# Patient Record
Sex: Female | Born: 1975 | Race: Black or African American | Hispanic: No | Marital: Single | Smoking: Never smoker
Health system: Southern US, Community
[De-identification: ages and names within clinical notes are randomized; demographics above are authoritative.]

## PROBLEM LIST (undated history)

## (undated) DIAGNOSIS — K649 Unspecified hemorrhoids: Secondary | ICD-10-CM

## (undated) DIAGNOSIS — A048 Other specified bacterial intestinal infections: Secondary | ICD-10-CM

## (undated) DIAGNOSIS — K219 Gastro-esophageal reflux disease without esophagitis: Secondary | ICD-10-CM

## (undated) DIAGNOSIS — D649 Anemia, unspecified: Secondary | ICD-10-CM

## (undated) HISTORY — PX: ABDOMINAL HYSTERECTOMY: SHX81

## (undated) HISTORY — DX: Other specified bacterial intestinal infections: A04.8

## (undated) HISTORY — DX: Gastro-esophageal reflux disease without esophagitis: K21.9

## (undated) HISTORY — PX: APPENDECTOMY: SHX54

## (undated) HISTORY — DX: Unspecified hemorrhoids: K64.9

---

## 2003-06-08 ENCOUNTER — Emergency Department (HOSPITAL_COMMUNITY): Admission: EM | Admit: 2003-06-08 | Discharge: 2003-06-08 | Payer: Self-pay | Admitting: Emergency Medicine

## 2005-04-30 ENCOUNTER — Encounter: Admission: RE | Admit: 2005-04-30 | Discharge: 2005-04-30 | Payer: Self-pay | Admitting: Family Medicine

## 2005-10-09 ENCOUNTER — Emergency Department (HOSPITAL_COMMUNITY): Admission: EM | Admit: 2005-10-09 | Discharge: 2005-10-10 | Payer: Self-pay | Admitting: Emergency Medicine

## 2005-11-16 ENCOUNTER — Emergency Department (HOSPITAL_COMMUNITY): Admission: EM | Admit: 2005-11-16 | Discharge: 2005-11-16 | Payer: Self-pay | Admitting: Emergency Medicine

## 2006-03-03 ENCOUNTER — Ambulatory Visit (HOSPITAL_COMMUNITY): Admission: RE | Admit: 2006-03-03 | Discharge: 2006-03-03 | Payer: Self-pay | Admitting: Family Medicine

## 2007-05-18 ENCOUNTER — Ambulatory Visit (HOSPITAL_COMMUNITY): Admission: RE | Admit: 2007-05-18 | Discharge: 2007-05-18 | Payer: Self-pay | Admitting: Family Medicine

## 2007-09-01 ENCOUNTER — Emergency Department (HOSPITAL_COMMUNITY): Admission: EM | Admit: 2007-09-01 | Discharge: 2007-09-01 | Payer: Self-pay | Admitting: Emergency Medicine

## 2007-09-03 ENCOUNTER — Ambulatory Visit: Payer: Self-pay | Admitting: Gastroenterology

## 2007-09-14 ENCOUNTER — Ambulatory Visit: Payer: Self-pay | Admitting: Internal Medicine

## 2007-09-14 ENCOUNTER — Encounter: Payer: Self-pay | Admitting: Internal Medicine

## 2007-09-14 ENCOUNTER — Ambulatory Visit (HOSPITAL_COMMUNITY): Admission: RE | Admit: 2007-09-14 | Discharge: 2007-09-14 | Payer: Self-pay | Admitting: Internal Medicine

## 2007-09-14 HISTORY — PX: ESOPHAGOGASTRODUODENOSCOPY: SHX1529

## 2007-12-31 ENCOUNTER — Ambulatory Visit: Payer: Self-pay | Admitting: Internal Medicine

## 2008-04-17 ENCOUNTER — Ambulatory Visit (HOSPITAL_COMMUNITY): Admission: RE | Admit: 2008-04-17 | Discharge: 2008-04-17 | Payer: Self-pay | Admitting: Pulmonary Disease

## 2008-06-27 ENCOUNTER — Emergency Department (HOSPITAL_COMMUNITY): Admission: EM | Admit: 2008-06-27 | Discharge: 2008-06-27 | Payer: Self-pay | Admitting: Emergency Medicine

## 2008-06-28 ENCOUNTER — Emergency Department (HOSPITAL_COMMUNITY): Admission: EM | Admit: 2008-06-28 | Discharge: 2008-06-28 | Payer: Self-pay | Admitting: Emergency Medicine

## 2008-07-23 ENCOUNTER — Emergency Department (HOSPITAL_COMMUNITY): Admission: EM | Admit: 2008-07-23 | Discharge: 2008-07-23 | Payer: Self-pay | Admitting: Emergency Medicine

## 2008-12-05 ENCOUNTER — Emergency Department (HOSPITAL_COMMUNITY): Admission: EM | Admit: 2008-12-05 | Discharge: 2008-12-05 | Payer: Self-pay | Admitting: Emergency Medicine

## 2010-01-17 ENCOUNTER — Encounter: Payer: Self-pay | Admitting: Gastroenterology

## 2010-01-18 ENCOUNTER — Encounter (INDEPENDENT_AMBULATORY_CARE_PROVIDER_SITE_OTHER): Payer: Self-pay

## 2010-03-07 ENCOUNTER — Ambulatory Visit: Payer: Self-pay | Admitting: Gastroenterology

## 2010-03-07 DIAGNOSIS — R1013 Epigastric pain: Secondary | ICD-10-CM | POA: Insufficient documentation

## 2010-03-07 DIAGNOSIS — K219 Gastro-esophageal reflux disease without esophagitis: Secondary | ICD-10-CM | POA: Insufficient documentation

## 2010-03-07 DIAGNOSIS — Z8711 Personal history of peptic ulcer disease: Secondary | ICD-10-CM

## 2010-03-25 ENCOUNTER — Emergency Department (HOSPITAL_COMMUNITY): Admission: EM | Admit: 2010-03-25 | Discharge: 2010-03-26 | Payer: Self-pay | Admitting: Emergency Medicine

## 2010-03-29 ENCOUNTER — Encounter: Payer: Self-pay | Admitting: Gastroenterology

## 2010-12-10 NOTE — Assessment & Plan Note (Signed)
Summary: ONE YR FU,.GU   Visit Type:  Follow-up Visit Primary Care Provider:  Sudie Bailey  Chief Complaint:  F/U gerd.  History of Present Illness: [name] is here for f/u visit. She has h/o erosive reflux esophagitis and H. Pylori. She was last seen 2/09. She does well for most part if she takes prevacid two times a day. She cut back to once daily for awhile but started having break through symptoms. Recently was treated empirically for H. Pylori based on symptoms. Took amoxicillin and biaxin. She has been treated for a total of three times and really not clear if she ever had documented positive H. Pylori serologies. She has had some discomfort in the epigastrium. BM normal. No melena, brbpr. No dysphagia.  Current Medications (verified): 1)  Lansoprazole 30 Mg Cpdr (Lansoprazole) .... One By Mouth 30 Mins Before Breakfast and 30 Mins Before Evening Meal.  Allergies (verified): 1)  ! Sulfa  Review of Systems      See HPI  Vital Signs:  Patient profile:   35 year old female Height:      61 inches Weight:      129 pounds BMI:     24.46 Temp:     98.1 degrees F oral Pulse rate:   68 / minute BP sitting:   100 / 70  (left arm) Cuff size:   regular  Vitals Entered By: Cloria Spring LPN (March 07, 2010 1:32 PM)  Physical Exam  General:  Well developed, well nourished, no acute distress. Head:  Normocephalic and atraumatic. Eyes:  sclera nonicteric Mouth:  op moist Abdomen:  Bowel sounds normal.  Abdomen is soft, nontender, nondistended.  No rebound or guarding.  No hepatosplenomegaly, masses or hernias.  No abdominal bruits.  Extremities:  No clubbing, cyanosis, edema or deformities noted. Neurologic:  Alert and  oriented x4;  grossly normal neurologically. Skin:  Intact without significant lesions or rashes. Psych:  Alert and cooperative. Normal mood and affect.  Impression & Recommendations:  Problem # 1:  GERD (ICD-530.81)  H/O erosive reflux esophagitis. Typical  heartburn controlled. With recent epigastric discomfort treated for third time for H. Pylori. Need to lay issue to rest. Check H. Pylori stool antigen. Continue Prevacid two times a day for now. At some point, when she is feeling well, she should try to wean down to once daily dosing. New RX given. OV in 2 years or sooner if needed.   Orders: Est. Patient Level II (09811) T-Helicobactor Pylori Antigen Stool (91478) Prescriptions: LANSOPRAZOLE 30 MG CPDR (LANSOPRAZOLE) one by mouth 30 mins before breakfast and 30 mins before evening meal.  #60 x 11   Entered and Authorized by:   Leanna Battles. Dixon Boos   Signed by:   Leanna Battles Dixon Boos on 03/07/2010   Method used:   Electronically to        [facility] (retail)       [address]       [place], [place]  [address]       Ph: [phone] or [phone]       Fax: [phone]   RxID:   [id number]  CC: Dr. John Giovanni  Appended Document: ONE YR FU,.GU reminder in computer  Appended Document: ONE YR FU,.GU Did patient ever do hp stool ag?  Appended Document: ONE YR FU,.GU LMOM to call.  Appended Document: ONE YR FU,.GU Pt said she will try to do this week.

## 2010-12-10 NOTE — Letter (Signed)
Summary: Recall Office Visit  [facility]  [address]   [place], [place] [address]   Phone: [phone]  Fax: [phone]      January 18, 2010   [name] [address] [place], [place]  [address] August 04, 1976   Dear [name],   According to our records, it is time for you to schedule a follow-up office visit with [place]. It will be necessary that you schedule one before getting any additional refills. Please call our office @ [phone].   At your convenience, please call [phone] to schedule an office visit. If you have any questions, concerns, or feel that this letter is in error, we would appreciate your call.   Sincerely,    Cloria Spring LPN  [facility] Ph: [phone]   Fax: [phone]

## 2010-12-10 NOTE — Medication Information (Signed)
Summary: Tax adviser   Imported By: Diana Eves 01/17/2010 14:30:33  _____________________________________________________________________  External Attachment:    Type:   Image     Comment:   External Document  Appended Document: RX Folder I need to see patients chart.  Appended Document: RX Folder On your cart.  Appended Document: RX Folder - lansoprazole SHE NEEDS APPT PRIOR TO FURTHER REFILLS. LAST SEEN 2/09.   Prescriptions: LANSOPRAZOLE 30 MG CPDR (LANSOPRAZOLE) one by mouth 30 mins before breakfast and 30 mins before evening meal.  #60 x 0   Entered and Authorized by:   Leanna Battles. Dixon Boos   Signed by:   Leanna Battles Dixon Boos on 01/18/2010   Method used:   Electronically to        [facility] (retail)       [address]       [place], [place]  [address]       Ph: [phone] or [phone]       Fax: [phone]   RxID:   [id number]     Appended Document: RX Folder LMOM needs to schedule appt for further refills.  Appended Document: RX Folder Letter mailed also to call and schedule appt.

## 2011-01-27 LAB — POCT PREGNANCY, URINE: Preg Test, Ur: NEGATIVE

## 2011-01-27 LAB — URINALYSIS, ROUTINE W REFLEX MICROSCOPIC
Bilirubin Urine: NEGATIVE
Glucose, UA: NEGATIVE mg/dL
Hgb urine dipstick: NEGATIVE
Ketones, ur: NEGATIVE mg/dL
Nitrite: NEGATIVE
Protein, ur: NEGATIVE mg/dL
Specific Gravity, Urine: 1.02 (ref 1.005–1.030)
Urobilinogen, UA: 0.2 mg/dL (ref 0.0–1.0)
pH: 6.5 (ref 5.0–8.0)

## 2011-01-27 LAB — GC/CHLAMYDIA PROBE AMP, GENITAL
Chlamydia, DNA Probe: NEGATIVE
GC Probe Amp, Genital: NEGATIVE

## 2011-01-27 LAB — DIFFERENTIAL
Basophils Absolute: 0 10*3/uL (ref 0.0–0.1)
Basophils Relative: 1 % (ref 0–1)
Eosinophils Absolute: 0.1 10*3/uL (ref 0.0–0.7)
Eosinophils Relative: 2 % (ref 0–5)
Lymphocytes Relative: 24 % (ref 12–46)
Lymphs Abs: 1.8 10*3/uL (ref 0.7–4.0)
Monocytes Absolute: 0.7 10*3/uL (ref 0.1–1.0)
Monocytes Relative: 9 % (ref 3–12)
Neutro Abs: 4.7 10*3/uL (ref 1.7–7.7)
Neutrophils Relative %: 64 % (ref 43–77)

## 2011-01-27 LAB — PREGNANCY, URINE: Preg Test, Ur: NEGATIVE

## 2011-01-27 LAB — CBC
HCT: 35.1 % — ABNORMAL LOW (ref 36.0–46.0)
Hemoglobin: 12.5 g/dL (ref 12.0–15.0)
MCHC: 35.7 g/dL (ref 30.0–36.0)
MCV: 87.9 fL (ref 78.0–100.0)
Platelets: 308 10*3/uL (ref 150–400)
RBC: 4 MIL/uL (ref 3.87–5.11)
RDW: 13.2 % (ref 11.5–15.5)
WBC: 7.4 10*3/uL (ref 4.0–10.5)

## 2011-01-27 LAB — WET PREP, GENITAL
Trich, Wet Prep: NONE SEEN
Yeast Wet Prep HPF POC: NONE SEEN

## 2011-01-27 LAB — RPR: RPR Ser Ql: NONREACTIVE

## 2011-03-25 NOTE — Consult Note (Signed)
NAMESHATERRIA, SAGER            ACCOUNT NO.:  [id number]   MEDICAL RECORD NO.:  [id number]          PATIENT TYPE:  AMB   LOCATION:  DAY                           FACILITY:  [facility]   PHYSICIAN:  Kassie Mends, M.D.      DATE OF BIRTH:  February 02, 1976   DATE OF CONSULTATION:  09/03/2007  DATE OF DISCHARGE:                                 CONSULTATION   REASON FOR CONSULTATION:  Epigastric pain.   PHYSICIAN REQUESTING CONSULTATION:  Hilario Quarry, M.D.   PRIMARY CARE PHYSICIAN:  Mila Homer. Sudie Bailey, M.D.   HISTORY OF PRESENT ILLNESS:  The patient is a 35 year old lady who  presents with intermittent epigastric pain. She states she started  having these symptoms back in June or July of this year. Dr. Sudie Bailey  did some blood work and started her on some Prevpac therapy. It is not  clear whether or not her H. pylori  serologies were positive or not. She  states within a few days, her abdominal pain had resolved. She did very  well for several months but a couple of weeks ago started noticing  recurrent epigastric discomfort. She states the pain is constant but  intermittently is more severe than at other times. She denies any  vomiting but has had nausea. She describes the pain as burning at times.  She states she was Hemoccult negative once this summer. Her bowel  movements are regular. She denies melena or bright red blood per rectum.  Her weight has been stable. She denies any frequent NSAID use. She takes  Aleve a couple of times a month.   CURRENT MEDICATIONS:  She is currently taking her second round of  Prevpac this year. Allegra p.r.n., Aleve p.r.n.   ALLERGIES:  SULFA.   PAST MEDICAL HISTORY:  Negative for chronic illnesses.   PAST SURGICAL HISTORY:  Appendectomy.   FAMILY HISTORY:  Negative for colorectal cancer, chronic GI illnesses,  peptic ulcer disease or liver disease.   SOCIAL HISTORY:  She is single, she is currently in a Manufacturing systems engineer. She  has never been a smoker and no alcohol use.   REVIEW OF SYSTEMS:  See HPI for GI and constitutional. CARDIOPULMONARY:  No chest pain or shortness of breath.   PHYSICAL EXAMINATION:  Weight 132, height 5 foot 1, temperature 97.6,  blood pressure 100/70, pulse 60.  GENERAL:  Pleasant, well-nourished, well-developed female in no acute  distress.  SKIN:  Warm and dry, no jaundice.  HEENT:  Sclera nonicteric. Oropharyngeal mucosa moist and pink. No  lesions, erythema or exudate. No lymphadenopathy or thyromegaly.  CHEST:  Lungs are clear to auscultation.  CARDIAC:  Regular rate and rhythm. Normal S1, S2. No murmurs, rubs or  gallops.  ABDOMEN:  Positive bowel sounds, soft, nondistended. She has mild  epigastric tenderness to deep palpation. No organomegaly or masses, no  rebound tenderness or guarding. No abdominal bruits or hernias.  LOWER EXTREMITIES:  No edema.   She had an abdominal ultrasound done on October 22. She had a partially  contracted gallbladder without stones or wall thickening. Back in July  2008, she had a CT of the abdomen and pelvis which revealed a tiny left  renal cyst and a small 2-cm fibroid.   LABORATORY DATA:  Negative urinalysis, negative hCG urine pregnancy  test, lipase was 29, MET 7 was normal, LFTs were normal. CBC was  unremarkable with a normal white count of 7400, hemoglobin 13.2.   IMPRESSION:  [name] is a 35 year old lady with recurrent epigastric  pain. This occasionally has been occurring for approximately 2 weeks.  Previously responded to Prevpac therapy although I am not aware of  whether or not her H. pylori  serologies were positive in the past.  Given ongoing epigastric discomfort, would recommend upper endoscopy for  further evaluation. Differential includes gastritis, peptic ulcer  disease, GERD, dyspepsia. Low likelihood her symptoms are related to  biliary source such as biliary dyskinesia.   PLAN:  EGD in the near future with Dr.  Cira Servant. She will continue her  Prevpac therapy as before. Further recommendations to follow.      Tana Coast, P.A.      Kassie Mends, M.D.  Electronically Signed    LL/MEDQ  D:  09/03/2007  T:  09/04/2007  Job:  [id number]   cc:   Hilario Quarry, M.D.  Fax: [phone]   Mila Homer. Sudie Bailey, M.D.  Fax: [phone]

## 2011-03-25 NOTE — Op Note (Signed)
[name], [name]            ACCOUNT NO.:  [id number]   MEDICAL RECORD NO.:  [id number]          PATIENT TYPE:  AMB   LOCATION:  DAY                           FACILITY:  [facility]   PHYSICIAN:  R. Roetta Sessions, M.D. DATE OF BIRTH:  03-03-1976   DATE OF PROCEDURE:  09/14/2007  DATE OF DISCHARGE:                               OPERATIVE REPORT   PROCEDURE:  Esophagogastroduodenoscopy with biopsy.   INDICATIONS FOR PROCEDURE:  [name] with intermittent  epigastric pain.  Symptoms started back in June of this year.  She was  treated with the Prevpac for 14 days by Dr. Sudie Bailey previously.  Her  symptoms resolved.  She now has recurrent symptoms is back on Prevpac.  I do not have any documentation of her H. pylori status but presumably  blood work revealed evidence of the infection previously.  She has five  more days Prevpac therapy left on her second around and her symptoms  have currently resolved on this therapy.  EGD is now being done to  further evaluate her recurrent symptoms.  This approach has been  discussed with the patient at length.  Potential risks, benefits and  alternatives have been reviewed, questions answered.  She is agreeable.  Please see documentation in the medical record.   PROCEDURE NOTE:  O2 saturation, blood pressure, pulse, respiration  monitored the entire procedure.   CONSCIOUS SEDATION:  Versed 3 mg IV, Demerol 75 mg IV divided doses.  Cetacaine spray topical pharyngeal anesthesia.   INSTRUMENT:  Pentax video chip system.   FINDINGS:  Examination tubular esophagus revealed four quadrant distal  esophageal erosions extending up into the distal esophagus 4 cm.  The  esophageal mucosa otherwise appeared normal.  Please see photos.  EG  junction easily traversed.  Stomach:  Gastric cavity was empty.  It insufflated well with air.  Thorough examination of gastric mucosa including retroflex view of the  proximal stomach, esophagogastric junction  demonstrated only a couple of  antral erosions.  Pylorus patent, easily traversed.  Examination of the  bulb, second portion revealed no abnormalities.   THERAPEUTIC/DIAGNOSTIC MANEUVERS PERFORMED:  Biopsies of antrum were  taken for histologic study.  The patient tolerated the procedure well,  was reacted endoscopy.   IMPRESSION:  Four quadrant distal esophageal erosion consistent with  moderately severe erosive reflux esophagitis, otherwise normal  esophagus.  Tiny antral erosions otherwise normal stomach, status post  biopsy patent pylorus, normal D1, D2.   I suspect her improvement in symptoms in the second round of anti-H  pylori therapy is related to the b.i.d. PPI component which is more  aggressively addressing her complicated gastroesophageal reflux disease  that we now know is present.   RECOMMENDATIONS:  Complete the Prevpac therapy but then continue taking  Prevacid 30 mg capsules one before breakfast and supper.  Given her  prescription for this.  She is to stay on this medication.  Will plan to  see her back in six weeks, given her literature on antireflux measures,  will follow-up on path.  Further recommendations to follow.      Suszanne Conners  Rourk, M.D.  Electronically Signed     RMR/MEDQ  D:  09/14/2007  T:  09/15/2007  Job:  [id number]   cc:   Mila Homer. Sudie Bailey, M.D.  Fax: [phone]   Hilario Quarry, M.D.  Fax: [phone]

## 2011-03-25 NOTE — Assessment & Plan Note (Signed)
[name], [name]             CHART#:  [id number]   DATE:  12/31/2007                       DOB:  [birth year]   OFFICE FOLLOWUP:  Followup for acid reflux esophagitis.   The patient was last seen in November.  She failed to follow through on  multiple appointments here since that time but overall is doing well.  She took Prevpac for H. pylori.  She had some gastritis/erosions on her  prior EGD and four quadrant distal esophageal erosions.  She has been on  Prevacid 30 mg once daily since that time with excellent control of her  reflux symptoms.  She has occasional post prandial retro-xiphoid pain  with eating things containing cheese.  She has avoided cheese and does  real well and does not really have any problems with other dairy  products.  No dysphagia, no odynophagia.  Her insurance company will  only pay for omeprazole or Nexium.  She has tried Nexium and she says it  is like taking nothing at all.   CURRENT MEDICATIONS:  See updated list.   ALLERGIES:  SULFA.   PHYSICAL EXAMINATION:  GENERAL:  She appears well.  VITAL SIGNS:  Weight 139.5, height 5 feet 1 inch, temp 98, BP 98.64,  pulse 80.  SKIN:  Warm and dry.  ABDOMEN:  Nondistended.  Positive bowel sounds.  Soft.  Entirely  nontender.  Without appreciated mass, organomegaly.   ASSESSMENT:  History of erosive reflux esophagitis doing well on  Prevacid.  She failed to improve with Nexium.   RECOMMENDATIONS:  1. Antireflux measures/diet.  2. Continue Prevacid 30 mg orally daily.  We will try to keep her      supplied with samples as best we can.  We will contact her      insurance company and see if we cannot appeal their stance on      Nexium, which has been tried and does not work.   Unless something comes up, I plan to see this nice lady back in 1 year.       Jonathon Bellows, M.D.  Electronically Signed     RMR/MEDQ  D:  12/31/2007  T:  12/31/2007  Job:  [id number]   cc:   Mila Homer. Sudie Bailey, M.D.

## 2011-03-28 NOTE — Letter (Signed)
January 07, 2008     To Whom It May Concern:   Case No. [id number]  ID#:  [id number]   [name] is a patient of mine for which we are treating for erosive  reflux esophagitis.  She initially presented for treatment back in  October of 2008.  She was having chronic epigastric pain.  She was  treated with a Prevpac for H. pylori.  She underwent EGD by Dr. Jena Gauss on  09/14/2007.  She was found to have four quadrant distal esophageal  erosion consistent with moderately severe erosive reflux esophagitis and  tiny antral erosions.  She had been started on Prevacid 30 mg b.i.d.  Due to the fact that your insurance would not cover this medication for  her, she was then changed to Nexium 40 mg b.i.d.  She states that, it  felt like she was taking nothing at all while on this medication.  She  did give it an adequate trial of at least one month.  We are asking that  you review her case individually as well as her medical records, as we  feel this it is most appropriate that she remain on Prevacid 30 mg  b.i.d. not only for healing purposes but also for symptomatic treatment  of her complicated gastroesophageal reflux disease.     Lorenza Burton, N.P.  Electronically Signed     R. Roetta Sessions, M.D.  Electronically Signed    KJ/MEDQ  D:  01/07/2008  T:  01/08/2008  Job:  [id number]

## 2011-08-13 LAB — URINALYSIS, ROUTINE W REFLEX MICROSCOPIC
Bilirubin Urine: NEGATIVE
Glucose, UA: NEGATIVE
Hgb urine dipstick: NEGATIVE
Ketones, ur: NEGATIVE
Nitrite: NEGATIVE
Protein, ur: NEGATIVE
Specific Gravity, Urine: 1.02
Urobilinogen, UA: 0.2
pH: 8.5 — ABNORMAL HIGH

## 2011-08-20 LAB — COMPREHENSIVE METABOLIC PANEL
ALT: 12
AST: 17
Albumin: 3.9
Alkaline Phosphatase: 93
BUN: 6
CO2: 26
Calcium: 9.7
Chloride: 105
Creatinine, Ser: 0.66
GFR calc Af Amer: >60
GFR calc non Af Amer: >60
Glucose, Bld: 71
Potassium: 3.6
Sodium: 138
Total Bilirubin: 0.5
Total Protein: 7.5

## 2011-08-20 LAB — CBC
HCT: 38.5
Hemoglobin: 13.2
MCHC: 34.2
MCV: 87.9
Platelets: 344
RBC: 4.38
RDW: 13
WBC: 7.4

## 2011-08-20 LAB — DIFFERENTIAL
Basophils Absolute: 0
Basophils Relative: 0
Eosinophils Absolute: 0.1
Eosinophils Relative: 1
Lymphocytes Relative: 15
Lymphs Abs: 1.1
Monocytes Absolute: 0.3
Monocytes Relative: 5
Neutro Abs: 5.8
Neutrophils Relative %: 79 — ABNORMAL HIGH

## 2011-08-20 LAB — LIPASE, BLOOD: Lipase: 29

## 2011-08-20 LAB — URINALYSIS, ROUTINE W REFLEX MICROSCOPIC
Bilirubin Urine: NEGATIVE
Glucose, UA: NEGATIVE
Ketones, ur: NEGATIVE
pH: 7

## 2011-08-20 LAB — PREGNANCY, URINE: Preg Test, Ur: NEGATIVE

## 2012-02-02 ENCOUNTER — Encounter: Payer: Self-pay | Admitting: Internal Medicine

## 2012-04-10 ENCOUNTER — Encounter (HOSPITAL_COMMUNITY): Payer: Self-pay | Admitting: Emergency Medicine

## 2012-04-10 ENCOUNTER — Emergency Department (HOSPITAL_COMMUNITY): Payer: BC Managed Care – PPO

## 2012-04-10 ENCOUNTER — Emergency Department (HOSPITAL_COMMUNITY)
Admission: EM | Admit: 2012-04-10 | Discharge: 2012-04-10 | Disposition: A | Discharge status: Home / Self Care | Payer: BC Managed Care – PPO | Attending: Emergency Medicine | Admitting: Emergency Medicine

## 2012-04-10 DIAGNOSIS — R51 Headache: Secondary | ICD-10-CM | POA: Insufficient documentation

## 2012-04-10 DIAGNOSIS — S0990XA Unspecified injury of head, initial encounter: Secondary | ICD-10-CM

## 2012-04-10 DIAGNOSIS — W108XXA Fall (on) (from) other stairs and steps, initial encounter: Secondary | ICD-10-CM | POA: Insufficient documentation

## 2012-04-10 DIAGNOSIS — S060XAA Concussion with loss of consciousness status unknown, initial encounter: Secondary | ICD-10-CM | POA: Insufficient documentation

## 2012-04-10 DIAGNOSIS — S060X9A Concussion with loss of consciousness of unspecified duration, initial encounter: Secondary | ICD-10-CM

## 2012-04-10 MED ORDER — NAPROXEN 500 MG PO TABS: 500.0000 mg | ORAL_TABLET | Freq: Two times a day (BID) | ORAL | Status: DC

## 2012-04-10 MED ORDER — OXYCODONE-ACETAMINOPHEN 5-325 MG PO TABS
2.0000 | ORAL_TABLET | Freq: Once | ORAL | Status: DC
  Filled 2012-04-10: qty 2

## 2012-04-10 MED ORDER — IBUPROFEN 800 MG PO TABS
800.0000 mg | ORAL_TABLET | Freq: Once | ORAL | Status: AC
  Administered 2012-04-10: 800 mg via ORAL
  Filled 2012-04-10: qty 1

## 2012-04-10 NOTE — ED Notes (Signed)
Patient informed of plan of care at this time. Informed motrin will be ordered if CT is normal. Patient agreeable with plan.

## 2012-04-10 NOTE — Discharge Instructions (Signed)
Head Injury, Adult  You have had a head injury that does not appear serious at this time. A concussion is a state of changed mental ability, usually from a blow to the head. You should take clear liquids for the rest of the day and then resume your regular diet. You should not take sedatives or alcoholic beverages for as long as directed by your caregiver after discharge. After injuries such as yours, most problems occur within the first 24 hours.  SYMPTOMS  These minor symptoms may be experienced after discharge:  · Memory difficulties.  · Dizziness.  · Headaches.  · Double vision.  · Hearing difficulties.  · Depression.  · Tiredness.  · Weakness.  · Difficulty with concentration.  If you experience any of these problems, you should not be alarmed. A concussion requires a few days for recovery. Many patients with head injuries frequently experience such symptoms. Usually, these problems disappear without medical care. If symptoms last for more than one day, notify your caregiver. See your caregiver sooner if symptoms are becoming worse rather than better.  HOME CARE INSTRUCTIONS   · During the next 24 hours you must stay with someone who can watch you for the warning signs listed below.  Although it is unlikely that serious side effects will occur, you should be aware of signs and symptoms which may necessitate your return to this location. Side effects may occur up to 7 - 10 days following the injury. It is important for you to carefully monitor your condition and contact your caregiver or seek immediate medical attention if there is a change in your condition.  SEEK IMMEDIATE MEDICAL CARE IF:   · There is confusion or drowsiness.  · You can not awaken the injured person.  · There is nausea (feeling sick to your stomach) or continued, forceful vomiting.  · You notice dizziness or unsteadiness which is getting worse, or inability to walk.  · You have convulsions or unconsciousness.  · You experience severe,  persistent headaches not relieved by over-the-counter or prescription medicines for pain. (Do not take aspirin as this impairs clotting abilities). Take other pain medications only as directed.  · You can not use arms or legs normally.  · There is clear or bloody discharge from the nose or ears.  MAKE SURE YOU:   · Understand these instructions.  · Will watch your condition.  · Will get help right away if you are not doing well or get worse.  Document Released: 10/27/2005 Document Revised: 10/16/2011 Document Reviewed: 09/14/2009  [employer]® Patient Information ©2012 [employer].

## 2012-04-10 NOTE — ED Provider Notes (Signed)
History  This chart was scribed for Dayton Bailiff, MD by Stevphen Meuse. This patient was seen in room APA09/APA09 and the patient's care was started at 12:38PM.  CSN: [id number]  Arrival date & time 04/10/12  1212   First MD Initiated Contact with Patient 04/10/12 1236      Chief Complaint  Patient presents with  . Fall  . Head Injury    (Consider location/radiation/quality/duration/timing/severity/associated sxs/prior treatment) Patient is a 36 y.o. female presenting with fall and head injury. The history is provided by the patient. No language interpreter was used.  Fall The accident occurred 1 to 2 hours ago. There was no blood loss. The point of impact was the head. The pain is present in the head. She was ambulatory at the scene. There was no entrapment after the fall. Associated symptoms include visual change. Pertinent negatives include no fever, no abdominal pain, no nausea, no vomiting and no loss of consciousness. She has tried nothing for the symptoms.  Head Injury  The incident occurred 1 to 2 hours ago. She came to the ER via walk-in. The injury mechanism was a fall. There was no loss of consciousness. There was no blood loss. Pertinent negatives include no vomiting. She has tried nothing for the symptoms.  [name] is a 36 y.o. female who presents to the Emergency Department complaining of 2 hours of sudden onset, gradually worsening head pain. Pt states that she fell down 4 steps and struck the back of her head. She denies any LOC and states that she saw zig zag lines out of her right eye. Pt states that she feels like she has internal bleeding. Pt denies taking any medication to relieve her pain because she wanted to wait until she got checked out by a doctor first. Pt also denies any modifying factors. Pt denies fever, sore throat, eye pain, SOB, chest pain, dysuria, arthralgia, HA, adenopathy and confusion as associated symptoms. Pt does not have a h/o chronic medical  conditions. Pt denies a h/o smoking and alcohol use.   History reviewed. No pertinent past medical history.  History reviewed. No pertinent past surgical history.  History reviewed. No pertinent family history.  History  Substance Use Topics  . Smoking status: Not on file  . Smokeless tobacco: Not on file  . Alcohol Use: No    OB History    Grav Para Term Preterm Abortions TAB SAB Ect Mult Living                  Review of Systems  Constitutional: Negative for fever, chills, appetite change and fatigue.  HENT: Negative for sore throat, rhinorrhea and neck pain.   Eyes: Positive for visual disturbance.  Gastrointestinal: Negative for nausea, vomiting and abdominal pain.  Genitourinary: Negative for dysuria, urgency, flank pain and difficulty urinating.  Musculoskeletal: Negative for back pain.  Skin: Negative for rash and wound.  Neurological: Negative for loss of consciousness and syncope.  Hematological: Does not bruise/bleed easily.  Psychiatric/Behavioral: Negative for confusion.  All other systems reviewed and are negative.    Allergies  Sulfonamide derivatives  Home Medications   Current Outpatient Rx  Name Route Sig Dispense Refill  . ALBUTEROL SULFATE HFA 108 (90 BASE) MCG/ACT IN AERS Inhalation Inhale 2 puffs into the lungs every 6 (six) hours as needed. For shortness of breath    . LORATADINE 10 MG PO TABS Oral Take 10 mg by mouth daily as needed. For allergies    . NAPROXEN  500 MG PO TABS Oral Take 1 tablet (500 mg total) by mouth 2 (two) times daily. 30 tablet 0    Triage Vitals: BP 122/73  Pulse 87  Temp(Src) 98.6 F (37 C) (Oral)  Resp 18  Ht 5\' 1"  (1.549 m)  Wt 135 lb (61.236 kg)  BMI 25.51 kg/m2  SpO2 100%  LMP 03/09/2012  Physical Exam  Nursing note and vitals reviewed. Constitutional: She is oriented to person, place, and time. She appears well-developed and well-nourished.  HENT:  Head: Normocephalic and atraumatic.  Mouth/Throat:  Oropharynx is clear and moist.  Eyes: Conjunctivae and EOM are normal. Pupils are equal, round, and reactive to light.  Neck: Normal range of motion. Neck supple.  Cardiovascular: Normal rate, regular rhythm, normal heart sounds and intact distal pulses.  Exam reveals no gallop and no friction rub.   No murmur heard. Pulmonary/Chest: Effort normal and breath sounds normal.  Abdominal: Soft. Bowel sounds are normal. There is no tenderness. There is no rebound and no guarding.  Musculoskeletal: Normal range of motion. She exhibits no edema and no tenderness.  Neurological: She is alert and oriented to person, place, and time. She has normal strength. No cranial nerve deficit or sensory deficit.  Skin: Skin is warm and dry.  Psychiatric: She has a normal mood and affect. Her behavior is normal. Judgment and thought content normal.    ED Course  Procedures (including critical care time)  DIAGNOSTIC STUDIES: Oxygen Saturation is 100% on room air, normal by my interpretation.    COORDINATION OF CARE:  12:44PM Discussed administering medication for pain and ordering CT scan of her head with pt and pt agreed.  Labs Reviewed - No data to display Ct Head Wo Contrast  04/10/2012  *RADIOLOGY REPORT*  Clinical Data: 36 year old female status post fall with back of head injury.  CT HEAD WITHOUT CONTRAST  Technique:  Contiguous axial images were obtained from the base of the skull through the vertex without contrast.  Comparison: 04/17/2008.  Findings: Visualized paranasal sinuses and mastoids are clear. Visualized orbit soft tissues are within normal limits.  No scalp hematoma identified. No acute osseous abnormality identified.  Cerebral volume is within normal limits for age.  No midline shift, ventriculomegaly, mass effect, evidence of mass lesion, intracranial hemorrhage or evidence of cortically based acute infarction.  Gray-white matter differentiation is within normal limits throughout the brain.   No suspicious intracranial vascular hyperdensity.  IMPRESSION: Stable and normal noncontrast CT appearance of the brain.  No acute traumatic injury identified.  Original Report Authenticated By: Ulla Potash III, M.D.     1. Closed head injury   2. Concussion       MDM  Close injury was minor concussion. There is no evidence of intracranial hemorrhage. I explained to the patient that she did not require a CAT scan of the head due to her lack of loss of consciousness, lack of neurologic symptoms based on the [place] head CT rules. However the patient requested a CAT scan of her head. This was negative. She'll be discharged home      I personally performed the services described in this documentation, which was scribed in my presence. The recorded information has been reviewed and considered.    Dayton Bailiff, MD 04/10/12 1401

## 2012-04-10 NOTE — ED Notes (Signed)
Pt c/o fall earlier today. Pt states she fell down 4 step and struck the back of head. Pt denies any loc and states the sight of her rt eye is off ( she sees a zigzag line).

## 2012-04-10 NOTE — ED Notes (Signed)
Patient refusing percocet at this time. States "I think that is too strong right now" Requesting motrin. Dr Brooke Dare aware and will order motrin if CT is normal.

## 2012-07-14 ENCOUNTER — Emergency Department (HOSPITAL_COMMUNITY)
Admission: EM | Admit: 2012-07-14 | Discharge: 2012-07-15 | Disposition: A | Discharge status: Home / Self Care | Payer: BC Managed Care – PPO | Attending: Emergency Medicine | Admitting: Emergency Medicine

## 2012-07-14 ENCOUNTER — Emergency Department (HOSPITAL_COMMUNITY): Payer: BC Managed Care – PPO

## 2012-07-14 ENCOUNTER — Encounter (HOSPITAL_COMMUNITY): Payer: Self-pay | Admitting: Emergency Medicine

## 2012-07-14 DIAGNOSIS — R05 Cough: Secondary | ICD-10-CM | POA: Insufficient documentation

## 2012-07-14 DIAGNOSIS — B9789 Other viral agents as the cause of diseases classified elsewhere: Secondary | ICD-10-CM | POA: Insufficient documentation

## 2012-07-14 DIAGNOSIS — Z882 Allergy status to sulfonamides status: Secondary | ICD-10-CM | POA: Insufficient documentation

## 2012-07-14 DIAGNOSIS — B349 Viral infection, unspecified: Secondary | ICD-10-CM

## 2012-07-14 DIAGNOSIS — R059 Cough, unspecified: Secondary | ICD-10-CM | POA: Insufficient documentation

## 2012-07-14 DIAGNOSIS — J309 Allergic rhinitis, unspecified: Secondary | ICD-10-CM | POA: Insufficient documentation

## 2012-07-14 MED ORDER — GUAIFENESIN ER 600 MG PO TB12: 600.0000 mg | ORAL_TABLET | Freq: Two times a day (BID) | ORAL | Status: DC

## 2012-07-14 MED ORDER — BENZONATATE 100 MG PO CAPS: 200.0000 mg | ORAL_CAPSULE | Freq: Two times a day (BID) | ORAL | Status: AC | PRN

## 2012-07-14 MED ORDER — SODIUM CHLORIDE 0.65 % NA SOLN: 1.0000 | NASAL | Status: DC | PRN

## 2012-07-14 MED ORDER — FEXOFENADINE HCL 180 MG PO TABS: 180.0000 mg | ORAL_TABLET | Freq: Every day | ORAL | Status: DC

## 2012-07-14 MED ORDER — OXYCODONE-ACETAMINOPHEN 5-325 MG PO TABS: 1.0000 | ORAL_TABLET | Freq: Four times a day (QID) | ORAL | Status: AC | PRN

## 2012-07-14 NOTE — ED Notes (Signed)
Patient c/o nasal congestion and cough; also c/o RUQ pain.

## 2012-07-14 NOTE — ED Notes (Signed)
Patient transported to X-ray 

## 2012-07-14 NOTE — ED Provider Notes (Signed)
History    This chart was scribed for Tobin Chad, MD, MD by Smitty Pluck. The patient was seen in room APA11 and the patient's care was started at 11:06PM.   CSN: [id number]  Arrival date & time 07/14/12  2141   First MD Initiated Contact with Patient 07/14/12 1306      Chief Complaint  Patient presents with  . Nasal Congestion  . Cough    (Consider location/radiation/quality/duration/timing/severity/associated sxs/prior treatment) Patient is a 36 y.o. female presenting with cough. The history is provided by the patient. No language interpreter was used.  Cough Associated symptoms include rhinorrhea and sore throat.   [name] is a 36 y.o. female who presents to the Emergency Department complaining of constant, moderate nasal congestion, non productive cough, headache and sharp chest pain onset 2 days ago. She reports having sore throat 1 day ago but symptoms have been improved today. Pt reports that she thinks she has a fever. Denies taking medication PTA.   PCP is Dr. Sudie Bailey   History reviewed. No pertinent past medical history.  History reviewed. No pertinent past surgical history.  No family history on file.  History  Substance Use Topics  . Smoking status: Never Smoker   . Smokeless tobacco: Not on file  . Alcohol Use: No    OB History    Grav Para Term Preterm Abortions TAB SAB Ect Mult Living                  Review of Systems  Constitutional: Positive for fever.  HENT: Positive for congestion, sore throat and rhinorrhea.   Respiratory: Positive for cough.   Gastrointestinal: Negative for nausea, vomiting, abdominal pain and diarrhea.  Skin: Negative for rash.  All other systems reviewed and are negative.    Allergies  Sulfonamide derivatives  Home Medications   Current Outpatient Rx  Name Route Sig Dispense Refill  . ALBUTEROL SULFATE HFA 108 (90 BASE) MCG/ACT IN AERS Inhalation Inhale 2 puffs into the lungs every 6 (six) hours as  needed. For shortness of breath      BP 113/82  Pulse 89  Temp 98.3 F (36.8 C) (Oral)  Resp 20  Ht 5\' 1"  (1.549 m)  Wt 135 lb (61.236 kg)  BMI 25.51 kg/m2  SpO2 100%  LMP 07/13/2012  Physical Exam  Nursing note and vitals reviewed. Constitutional: She is oriented to person, place, and time. She appears well-developed and well-nourished.  HENT:  Head: Normocephalic and atraumatic. Not macrocephalic and not microcephalic. Head is without raccoon's eyes, without Battle's sign, without contusion and without laceration. No trismus in the jaw.  Right Ear: External ear normal.  Left Ear: External ear normal.  Nose: Mucosal edema, rhinorrhea and sinus tenderness present. No nose lacerations, nasal deformity, septal deviation or nasal septal hematoma. No epistaxis.  No foreign bodies. Right sinus exhibits no maxillary sinus tenderness and no frontal sinus tenderness. Left sinus exhibits no maxillary sinus tenderness and no frontal sinus tenderness.  Mouth/Throat: Uvula is midline, oropharynx is clear and moist and mucous membranes are normal. Mucous membranes are not pale, not dry and not cyanotic. No uvula swelling. No oropharyngeal exudate, posterior oropharyngeal edema, posterior oropharyngeal erythema or tonsillar abscesses.  Eyes: Conjunctivae and EOM are normal. Pupils are equal, round, and reactive to light. Right eye exhibits no discharge. Left eye exhibits no discharge. No scleral icterus.  Neck: Normal range of motion. Neck supple. No JVD present. No tracheal deviation present. No thyromegaly present.  Cardiovascular: Normal rate, regular rhythm, normal heart sounds and intact distal pulses.  Exam reveals no gallop and no friction rub.   No murmur heard. Pulmonary/Chest: Effort normal and breath sounds normal. No stridor. No respiratory distress. She has no wheezes. She has no rales. She exhibits no tenderness.  Abdominal: Soft. Bowel sounds are normal. She exhibits no distension and no  mass. There is no tenderness. There is no rebound and no guarding.  Musculoskeletal: Normal range of motion. She exhibits no edema and no tenderness.  Lymphadenopathy:    She has no cervical adenopathy.  Neurological: She is alert and oriented to person, place, and time. No cranial nerve deficit.  Skin: Skin is warm and dry. No rash noted. No erythema. No pallor.  Psychiatric: She has a normal mood and affect. Her behavior is normal.    ED Course  Procedures (including critical care time) DIAGNOSTIC STUDIES: Oxygen Saturation is 100% on room air, normal by my interpretation.    COORDINATION OF CARE: 11:14PM  Discussed pt ED treatment with pt     Labs Reviewed - No data to display Dg Chest 2 View  07/14/2012  *RADIOLOGY REPORT*  Clinical Data: Cough and nasal congestion for 2 days  CHEST - 2 VIEW  Comparison: 12/05/2008  Findings: Normal heart size, mediastinal contours, and pulmonary vascularity. Lungs clear. Bones unremarkable. No pneumothorax.  IMPRESSION: Normal exam.   Original Report Authenticated By: Lollie Marrow, M.D.      No diagnosis found.    MDM  Pt presents for evaluation of nasal congestion and a nonproductive cough.  She is afebrile - NAD.  Note stable VS.  CXR neg for infiltrate.  Pt's exam and hx are consistent with a viral process.  No purulent nasal discharge noted or sinus tenderness.  Plan symptomatic care.  Will also provide a prescription for allegra at the pt's request because she describes chronic sneezing, watery and red eyes, throat itching, and runny nose.  She has previously taken this medication.        Tobin Chad, MD 07/14/12 2350

## 2012-07-16 ENCOUNTER — Ambulatory Visit: Payer: BC Managed Care – PPO | Admitting: Internal Medicine

## 2012-07-20 ENCOUNTER — Encounter: Payer: Self-pay | Admitting: Internal Medicine

## 2012-07-20 ENCOUNTER — Ambulatory Visit: Payer: BC Managed Care – PPO | Admitting: Internal Medicine

## 2012-07-20 ENCOUNTER — Ambulatory Visit (INDEPENDENT_AMBULATORY_CARE_PROVIDER_SITE_OTHER): Payer: BC Managed Care – PPO | Admitting: Internal Medicine

## 2012-07-20 VITALS — BP 109/64 | HR 91 | Temp 98.1°F | Ht 61.0 in | Wt 142.6 lb

## 2012-07-20 DIAGNOSIS — K219 Gastro-esophageal reflux disease without esophagitis: Secondary | ICD-10-CM

## 2012-07-20 MED ORDER — LANSOPRAZOLE 30 MG PO CPDR: 30.0000 mg | DELAYED_RELEASE_CAPSULE | Freq: Two times a day (BID) | ORAL | Status: DC

## 2012-07-20 NOTE — Progress Notes (Signed)
Primary Care Physician:  Milana Obey, MD Primary Gastroenterologist:  Dr. Jena Gauss  Pre-Procedure History & Physical: HPI:  [name] is a 36 y.o. female here for followup of GERD. Came off lansoprazole. Hasn't been on any acid suppression therapy but has had recurrent typical reflux symptoms she describes as lower retrosternal chest pain. Has gained 13 pounds since her last visit. History of H. pylori treated. H. pylori stool antigen came back negative. No melena no dysphagia. No nausea or vomiting.  Past Medical History  Diagnosis Date  . GERD (gastroesophageal reflux disease)     Past Surgical History  Procedure Date  . Appendectomy   . Esophagogastroduodenoscopy 09/14/2007    Dr. Isabella Stalling quadrant distal esophageal erosion consistant with  moderately severe erosive reflux esophagitits, o/w normal esophagus.,tiny antral erosions o/w normal stomach, inflammation on bx    Prior to Admission medications   Medication Sig Start Date End Date Taking? Authorizing Provider  albuterol (PROVENTIL HFA;VENTOLIN HFA) 108 (90 BASE) MCG/ACT inhaler Inhale 2 puffs into the lungs every 6 (six) hours as needed. For shortness of breath   Yes Historical Provider, MD  fexofenadine (ALLEGRA) 180 MG tablet Take 1 tablet (180 mg total) by mouth daily. 07/14/12 07/14/13 Yes Tobin Chad, MD  lansoprazole (PREVACID) 30 MG capsule Take 30 mg by mouth daily.   Yes Historical Provider, MD  benzonatate (TESSALON) 100 MG capsule Take 2 capsules (200 mg total) by mouth 2 (two) times daily as needed for cough. 07/14/12 07/21/12  Tobin Chad, MD  guaiFENesin (MUCINEX) 600 MG 12 hr tablet Take 1 tablet (600 mg total) by mouth 2 (two) times daily. 07/14/12 07/14/13  Tobin Chad, MD  oxyCODONE-acetaminophen (PERCOCET) 5-325 MG per tablet Take 1 tablet by mouth every 6 (six) hours as needed for pain. 07/14/12 07/24/12  Tobin Chad, MD  sodium chloride (OCEAN) 0.65 % nasal spray Place 1 spray into the nose as  needed for congestion. 07/14/12 07/14/13  Tobin Chad, MD    Allergies as of 07/20/2012 - Review Complete 07/14/2012  Allergen Reaction Noted  . Sulfonamide derivatives Hives     No family history on file.  History   Social History  . Marital Status: Single    Spouse Name: N/A    Number of Children: N/A  . Years of Education: N/A   Occupational History  . Not on file.   Social History Main Topics  . Smoking status: Never Smoker   . Smokeless tobacco: Not on file  . Alcohol Use: No  . Drug Use: No  . Sexually Active:    Other Topics Concern  . Not on file   Social History Narrative  . No narrative on file    Review of Systems: See HPI, otherwise negative ROS  Physical Exam: BP 109/64  Pulse 91  Temp 98.1 F (36.7 C) (Temporal)  Ht 5\' 1"  (1.549 m)  Wt 142 lb 9.6 oz (64.683 kg)  BMI 26.94 kg/m2  LMP 07/13/2012 General:   Alert,  Well-developed, well-nourished, pleasant and cooperative in NAD Skin:  Intact without significant lesions or rashes. Eyes:  Sclera clear, no icterus.   Conjunctiva pink. Ears:  Normal auditory acuity. Nose:  No deformity, discharge,  or lesions. Mouth:  No deformity or lesions. Neck:  Supple; no masses or thyromegaly. No significant cervical adenopathy. Lungs:  Clear throughout to auscultation.   No wheezes, crackles, or rhonchi. No acute distress. Heart:  Regular rate and rhythm; no murmurs, clicks, rubs,  or gallops. Abdomen: Non-distended, normal bowel sounds.  Soft and nontender without appreciable mass or hepatosplenomegaly.  Pulses:  Normal pulses noted. Extremities:  Without clubbing or edema.  Impression/Plan:  Likely recurrent symptoms of GERD in the setting of known erosive reflux esophagitis area noncompliant with acid suppression therapy. She's gained a significant amount of weight recently which was predisposed her to worsening symptoms. No alarm features. Doubt other process such as occult gallbladder disease at this time  to  Recommendations: Resume lansoprazole 30 mg orally twice daily for now. We'll provide her GERD literature; will reassess in  6 week when she returns. 15 pound weight loss go between now and the end of the year. GERD information provided.

## 2012-07-20 NOTE — Patient Instructions (Addendum)
Resume lansoprazole 30 mg twice daily  Loose 15 pounds this year    Office visit in 6 weeks  GERD information

## 2012-08-20 ENCOUNTER — Ambulatory Visit (INDEPENDENT_AMBULATORY_CARE_PROVIDER_SITE_OTHER): Payer: BC Managed Care – PPO | Admitting: Internal Medicine

## 2012-08-20 ENCOUNTER — Encounter: Payer: Self-pay | Admitting: Internal Medicine

## 2012-08-20 VITALS — BP 106/68 | HR 72 | Temp 97.4°F | Ht 61.0 in | Wt 144.2 lb

## 2012-08-20 DIAGNOSIS — K219 Gastro-esophageal reflux disease without esophagitis: Secondary | ICD-10-CM

## 2012-08-20 NOTE — Progress Notes (Signed)
Primary Care Physician:  Milana Obey, MD Primary Gastroenterologist:  Dr. Jena Gauss  Pre-Procedure History & Physical: HPI:  [name] is a 36 y.o. female here for followup history of GERD/erosive reflux esophagitis/no Barrett's on prior EGD. Has been on Lanzoprazole 30 mg twice daily since her last office visit. Reflux symptoms well controlled. No dysplasia or other concerning symptoms. Has gained almost 2 pounds since she was last seen. She's trying to get more exercise.  Past Medical History  Diagnosis Date  . GERD (gastroesophageal reflux disease)     Past Surgical History  Procedure Date  . Appendectomy   . Esophagogastroduodenoscopy 09/14/2007    Dr. Isabella Stalling quadrant distal esophageal erosion consistant with  moderately severe erosive reflux esophagitits, o/w normal esophagus.,tiny antral erosions o/w normal stomach, inflammation on bx    Prior to Admission medications   Medication Sig Start Date End Date Taking? Authorizing Provider  albuterol (PROVENTIL HFA;VENTOLIN HFA) 108 (90 BASE) MCG/ACT inhaler Inhale 2 puffs into the lungs every 6 (six) hours as needed. For shortness of breath   Yes Historical Provider, MD  fexofenadine (ALLEGRA) 180 MG tablet Take 1 tablet (180 mg total) by mouth daily. 07/14/12 07/14/13 Yes Tobin Chad, MD  guaiFENesin (MUCINEX) 600 MG 12 hr tablet Take 1 tablet (600 mg total) by mouth 2 (two) times daily. 07/14/12 07/14/13 Yes Tobin Chad, MD  lansoprazole (PREVACID) 30 MG capsule Take 1 capsule (30 mg total) by mouth 2 (two) times daily. 07/20/12 07/20/13 Yes Corbin Ade, MD  sodium chloride (OCEAN) 0.65 % nasal spray Place 1 spray into the nose as needed for congestion. 07/14/12 07/14/13 Yes Tobin Chad, MD    Allergies as of 08/20/2012 - Review Complete 08/20/2012  Allergen Reaction Noted  . Sulfonamide derivatives Hives     No family history on file.  History   Social History  . Marital Status: Single    Spouse Name: N/A   Number of Children: N/A  . Years of Education: N/A   Occupational History  . Not on file.   Social History Main Topics  . Smoking status: Never Smoker   . Smokeless tobacco: Not on file  . Alcohol Use: No  . Drug Use: No  . Sexually Active:    Other Topics Concern  . Not on file   Social History Narrative  . No narrative on file    Review of Systems: See HPI, otherwise negative ROS  Physical Exam: BP 106/68  Pulse 72  Temp 97.4 F (36.3 C) (Temporal)  Ht 5\' 1"  (1.549 m)  Wt 144 lb 3.2 oz (65.409 kg)  BMI 27.25 kg/m2  LMP 07/28/2012 General:   Alert,  Well-developed, well-nourished, pleasant and cooperative in NAD Skin:  Intact without significant lesions or rashes. Eyes:  Sclera clear, no icterus.   Conjunctiva pink. Ears:  Normal auditory acuity. Nose:  No deformity, discharge,  or lesions. Mouth:  No deformity or lesions. Neck:  Supple; no masses or thyromegaly. No significant cervical adenopathy. Lungs:  Clear throughout to auscultation.   No wheezes, crackles, or rhonchi. No acute distress. Heart:  Regular rate and rhythm; no murmurs, clicks, rubs,  or gallops. Abdomen: Non-distended, normal bowel sounds.  Soft and nontender without appreciable mass or hepatosplenomegaly.  Pulses:  Normal pulses noted. Extremities:  Without clubbing or edema.  Impression/Plan:  History of GERD/erosive reflux esophagitis-symptoms now well controlled on proton pump inhibitor therapy twice a day.  Again, discussed the multipronged broach and reflux. Encouraged 10-15  pound weight loss over the next 6 months.  Will continue PPI in twice a day fashion until she returns. Hopefully, we will drop back to once daily and then when necessary the coming months.

## 2012-08-20 NOTE — Patient Instructions (Addendum)
Continue lanzoprazole 30 mg twice daily  Weight loss - 10- 15 pounds   Office visit in 6 months  GERD information

## 2012-08-27 ENCOUNTER — Ambulatory Visit: Payer: BC Managed Care – PPO | Admitting: Internal Medicine

## 2012-12-03 ENCOUNTER — Ambulatory Visit (INDEPENDENT_AMBULATORY_CARE_PROVIDER_SITE_OTHER): Payer: BC Managed Care – PPO | Admitting: Internal Medicine

## 2012-12-03 ENCOUNTER — Encounter: Payer: Self-pay | Admitting: Internal Medicine

## 2012-12-03 VITALS — BP 112/72 | HR 92 | Temp 98.4°F | Ht 61.0 in | Wt 149.6 lb

## 2012-12-03 DIAGNOSIS — K219 Gastro-esophageal reflux disease without esophagitis: Secondary | ICD-10-CM

## 2012-12-03 DIAGNOSIS — R1310 Dysphagia, unspecified: Secondary | ICD-10-CM

## 2012-12-03 NOTE — Progress Notes (Signed)
Primary Care Physician:  KNOWLTON,STEPHEN D, MD Primary Gastroenterologist:  Dr. Sabian Kuba  Pre-Procedure History & Physical: HPI:  [name] is a 36 y.o. female here for for evaluation of recurrent low retroxiphoid discomfort. Patient is a long history of GERD and history 9 reflux esophagitis seen on 2080 GD. She was seen here last fall was doing well on twice a day lansoprazole 30 mg. Over the past 2 months she's developed insidiously recurrent symptoms described as retrosternal discomfort described as retroxiphoid burning moving up behind her breast bone towards her throat. It may or may not occur postprandially. She really doesn't have any abdominal pain. Now she tells me that her Lanzoprazole  pills are starting to stick behind her breast bone and she must drink a full glass of water in order to get them to go down; no melena or hematochezia or other change in bowel habits.  She's gained another 5 pounds since she was last seen here and is as a good 15-20 pounds over her ideal body weight range. She does take an occasional Aleve but otherwise, denies nonsteroidal use. No family history of chronic GI illness including neoplasia. She does not use tobacco or alcohol. Gallbladder remains in situ H. pylori infection treated previously eradication documented with a negative HB stool antigen test subsequently.  Past Medical History  Diagnosis Date  . GERD (gastroesophageal reflux disease)   . Helicobacter pylori (H. pylori)     prevpac    Past Surgical History  Procedure Date  . Appendectomy   . Esophagogastroduodenoscopy 09/14/2007    Dr. Yesha Muchow-four quadrant distal esophageal erosion consistant with  moderately severe erosive reflux esophagitits, o/w normal esophagus.,tiny antral erosions o/w normal stomach, inflammation on bx    Prior to Admission medications   Medication Sig Start Date End Date Taking? Authorizing Provider  albuterol (PROVENTIL HFA;VENTOLIN HFA) 108 (90 BASE) MCG/ACT  inhaler Inhale 2 puffs into the lungs every 6 (six) hours as needed. For shortness of breath   Yes Historical Provider, MD  lansoprazole (PREVACID) 30 MG capsule Take 1 capsule (30 mg total) by mouth 2 (two) times daily. 07/20/12 07/20/13 Yes Huxton Glaus M Michaell Grider, MD  sodium chloride (OCEAN) 0.65 % nasal spray Place 1 spray into the nose as needed for congestion. 07/14/12 07/14/13 Yes Marwan T Powers, MD  fexofenadine (ALLEGRA) 180 MG tablet Take 1 tablet (180 mg total) by mouth daily. 07/14/12 07/14/13  Marwan T Powers, MD  guaiFENesin (MUCINEX) 600 MG 12 hr tablet Take 1 tablet (600 mg total) by mouth 2 (two) times daily. 07/14/12 07/14/13  Marwan T Powers, MD    Allergies as of 12/03/2012 - Review Complete 12/03/2012  Allergen Reaction Noted  . Sulfonamide derivatives Hives     No family history on file.  History   Social History  . Marital Status: Single    Spouse Name: N/A    Number of Children: N/A  . Years of Education: N/A   Occupational History  . Not on file.   Social History Main Topics  . Smoking status: Never Smoker   . Smokeless tobacco: Not on file  . Alcohol Use: No  . Drug Use: No  . Sexually Active:    Other Topics Concern  . Not on file   Social History Narrative  . No narrative on file    Review of Systems: See HPI, otherwise negative ROS  Physical Exam: BP 112/72  Pulse 92  Temp 98.4 F (36.9 C) (Oral)  Ht 5' 1" (1.549 m)    Wt 149 lb 9.6 oz (67.858 kg)  BMI 28.27 kg/m2  LMP 11/01/2012 General:   Alert,  Well-developed, well-nourished, pleasant and cooperative in NAD Skin:  Intact without significant lesions or rashes. Eyes:  Sclera clear, no icterus.   Conjunctiva pink. Ears:  Normal auditory acuity. Nose:  No deformity, discharge,  or lesions. Mouth:  No deformity or lesions. Neck:  Supple; no masses or thyromegaly. No significant cervical adenopathy. Lungs:  Clear throughout to auscultation.   No wheezes, crackles, or rhonchi. No acute distress. Heart:   Regular rate and rhythm; no murmurs, clicks, rubs,  or gallops. Abdomen: Non-distended, normal bowel sounds.  Soft and nontender without appreciable mass or hepatosplenomegaly.  Pulses:  Normal pulses noted. Extremities:  Without clubbing or edema.  Impression/Plan:  Pleasant 36-year-old lady with recurrent retroxiphoid and retrosternal pressure and burning discomfort now with pill dysphagia. I suspect progressive weight gain is predisposing her to recurrent reflux symptoms. Dysphagia demands further evaluation. She really does not have abdominal pain, per say, so, I doubt we're dealing with occult gallbladder disease or other GI pathology at this time  Recommendations: Further evaluation of her luminal upper GI tract. We talked about the risk and benefits of the EGD versus a barium pill esophagram.. Offered her a barium pill esophagram but this may not get at the problem adequately and, if abnormal, she will likely still need an  EGD. We mutually agreed that EGD with or without dilation as appropriate would be the most expeditious course to take at this time.The risks, benefits, limitations, alternatives and imponderables have been reviewed with the patient. Potential for esophageal dilation, biopsy, etc. have also been reviewed.  Questions have been answered. All parties agreeable.   I have again provided her literature on GERD. Emphasized the importance of weight loss. I have asked this nice lady to lose 10 pounds over the next 12 months. Further recommendations to follow pending findings at EGD.  

## 2012-12-03 NOTE — Patient Instructions (Addendum)
Schedule EGD with possible dilation next week - GERD and Dysphagia  GERD information provided  10 pound weight loss this year

## 2012-12-08 ENCOUNTER — Encounter (HOSPITAL_COMMUNITY): Payer: Self-pay | Admitting: Pharmacy Technician

## 2012-12-09 ENCOUNTER — Ambulatory Visit (HOSPITAL_COMMUNITY)
Admission: RE | Admit: 2012-12-09 | Discharge: 2012-12-09 | Disposition: A | Discharge status: Home / Self Care | Payer: BC Managed Care – PPO | Source: Ambulatory Visit | Attending: Internal Medicine | Admitting: Internal Medicine

## 2012-12-09 ENCOUNTER — Encounter (HOSPITAL_COMMUNITY)
Admission: RE | Disposition: A | Discharge status: Home / Self Care | Payer: Self-pay | Source: Ambulatory Visit | Attending: Internal Medicine

## 2012-12-09 ENCOUNTER — Encounter (HOSPITAL_COMMUNITY): Payer: Self-pay

## 2012-12-09 DIAGNOSIS — R1310 Dysphagia, unspecified: Secondary | ICD-10-CM

## 2012-12-09 DIAGNOSIS — K449 Diaphragmatic hernia without obstruction or gangrene: Secondary | ICD-10-CM

## 2012-12-09 DIAGNOSIS — K219 Gastro-esophageal reflux disease without esophagitis: Secondary | ICD-10-CM

## 2012-12-09 HISTORY — PX: ESOPHAGOGASTRODUODENOSCOPY (EGD) WITH ESOPHAGEAL DILATION: SHX5812

## 2012-12-09 SURGERY — ESOPHAGOGASTRODUODENOSCOPY (EGD) WITH ESOPHAGEAL DILATION
Anesthesia: Moderate Sedation

## 2012-12-09 MED ORDER — MIDAZOLAM HCL 5 MG/5ML IJ SOLN
INTRAMUSCULAR | Status: DC | PRN
  Administered 2012-12-09: 2 mg via INTRAVENOUS
  Administered 2012-12-09 (×3): 1 mg via INTRAVENOUS

## 2012-12-09 MED ORDER — BUTAMBEN-TETRACAINE-BENZOCAINE 2-2-14 % EX AERO
INHALATION_SPRAY | CUTANEOUS | Status: DC | PRN
  Administered 2012-12-09: 1 via TOPICAL

## 2012-12-09 MED ORDER — MEPERIDINE HCL 100 MG/ML IJ SOLN
INTRAMUSCULAR | Status: DC | PRN
  Administered 2012-12-09: 25 mg via INTRAVENOUS
  Administered 2012-12-09: 50 mg via INTRAVENOUS

## 2012-12-09 MED ORDER — MIDAZOLAM HCL 5 MG/5ML IJ SOLN
INTRAMUSCULAR | Status: AC
  Filled 2012-12-09: qty 10

## 2012-12-09 MED ORDER — SIMETHICONE 40 MG/0.6ML PO SUSP
ORAL | Status: DC | PRN
  Administered 2012-12-09: 11:00:00

## 2012-12-09 MED ORDER — ONDANSETRON HCL 4 MG/2ML IJ SOLN
INTRAMUSCULAR | Status: AC
  Filled 2012-12-09: qty 2

## 2012-12-09 MED ORDER — ONDANSETRON HCL 4 MG/2ML IJ SOLN
INTRAMUSCULAR | Status: DC | PRN
  Administered 2012-12-09: 4 mg via INTRAVENOUS

## 2012-12-09 MED ORDER — SODIUM CHLORIDE 0.45 % IV SOLN
INTRAVENOUS | Status: DC
  Administered 2012-12-09: 10:00:00 via INTRAVENOUS

## 2012-12-09 MED ORDER — MEPERIDINE HCL 100 MG/ML IJ SOLN
INTRAMUSCULAR | Status: AC
  Filled 2012-12-09: qty 2

## 2012-12-09 NOTE — Interval H&P Note (Signed)
History and Physical Interval Note:  12/09/2012 11:16 AM  [name]  has presented today for surgery, with the diagnosis of Dysphagia and GERD  The various methods of treatment have been discussed with the patient and family. After consideration of risks, benefits and other options for treatment, the patient has consented to  Procedure(s) (LRB) with comments: ESOPHAGOGASTRODUODENOSCOPY (EGD) WITH ESOPHAGEAL DILATION (N/A) - 10:30 as a surgical intervention .  The patient's history has been reviewed, patient examined, no change in status, stable for surgery.  I have reviewed the patient's chart and labs.  Questions were answered to the patient's satisfaction.     Eula Listen  Plan as outlined above. No change.The risks, benefits, limitations, alternatives and imponderables have been reviewed with the patient. Potential for esophageal dilation, biopsy, etc. have also been reviewed.  Questions have been answered. All parties agreeable.

## 2012-12-09 NOTE — Op Note (Signed)
[facility] [address] [place], [address]   ENDOSCOPY PROCEDURE REPORT  PATIENT: [name], [name]  MR#: [id number] BIRTHDATE: [birth year] , 36  yrs. old GENDER: Female ENDOSCOPIST: R.  Roetta Sessions, MD FACP FACG REFERRED BY:  Gareth Morgan, M.D. PROCEDURE DATE:  12/09/2012 PROCEDURE:     EGD with Elease Hashimoto dilation  INDICATIONS:    GERD dysphagia-pill associated  INFORMED CONSENT:   The risks, benefits, limitations, alternatives and imponderables have been discussed.  The potential for biopsy, esophogeal dilation, etc. have also been reviewed.  Questions have been answered.  All parties agreeable.  Please see the history and physical in the medical record for more information.  MEDICATIONS:  Versed 5 mg and Demerol 75 mg IV in divided doses. Cetacaine spray. Zofran 4 mg IV  DESCRIPTION OF PROCEDURE:   The Pentax Gastroscope [id number] endoscope was introduced through the mouth and advanced to the second portion of the duodenum without difficulty or limitations. The mucosal surfaces were surveyed very carefully during advancement of the scope and upon withdrawal.  Retroflexion view of the proximal stomach and esophagogastric junction was performed.      FINDINGS:   Normal-appearing, patent tubular esophagus. Stomach empty. Small hiatal hernia. Abnormal gastric mucosa. Patent pylorus. Normal first and second portion of the duodenum.  THERAPEUTIC / DIAGNOSTIC MANEUVERS PERFORMED:  A 54 French Maloney dilator was passed to full insertion easily. A look back revealed no apparent complication related to this maneuver.   COMPLICATIONS:  None  IMPRESSION:  Normal esophagus  -  status post Maloney dilation. Small hiatal hernia.  RECOMMENDATIONS:  Weight loss as previously recommended.  May continue Prevacid 30 mg orally twice daily but would like the patient to dissolve the contents of each capsule and 4 ounces of apple juice which he consumes this  medication. Followup appointment with [place] in 3 months    _______________________________ R. Roetta Sessions, MD FACP [facility] eSigned:  R. Roetta Sessions, MD FACP [facility] 12/09/2012 11:52 AM     CC:

## 2012-12-09 NOTE — H&P (View-Only) (Signed)
Primary Care Physician:  Milana Obey, MD Primary Gastroenterologist:  Dr. Jena Gauss  Pre-Procedure History & Physical: HPI:  [name] is a 37 y.o. female here for for evaluation of recurrent low retroxiphoid discomfort. Patient is a long history of GERD and history 9 reflux esophagitis seen on 2080 GD. She was seen here last fall was doing well on twice a day lansoprazole 30 mg. Over the past 2 months she's developed insidiously recurrent symptoms described as retrosternal discomfort described as retroxiphoid burning moving up behind her breast bone towards her throat. It may or may not occur postprandially. She really doesn't have any abdominal pain. Now she tells me that her Lanzoprazole  pills are starting to stick behind her breast bone and she must drink a full glass of water in order to get them to go down; no melena or hematochezia or other change in bowel habits.  She's gained another 5 pounds since she was last seen here and is as a good 15-20 pounds over her ideal body weight range. She does take an occasional Aleve but otherwise, denies nonsteroidal use. No family history of chronic GI illness including neoplasia. She does not use tobacco or alcohol. Gallbladder remains in situ H. pylori infection treated previously eradication documented with a negative HB stool antigen test subsequently.  Past Medical History  Diagnosis Date  . GERD (gastroesophageal reflux disease)   . Helicobacter pylori (H. pylori)     prevpac    Past Surgical History  Procedure Date  . Appendectomy   . Esophagogastroduodenoscopy 09/14/2007    Dr. Isabella Stalling quadrant distal esophageal erosion consistant with  moderately severe erosive reflux esophagitits, o/w normal esophagus.,tiny antral erosions o/w normal stomach, inflammation on bx    Prior to Admission medications   Medication Sig Start Date End Date Taking? Authorizing Provider  albuterol (PROVENTIL HFA;VENTOLIN HFA) 108 (90 BASE) MCG/ACT  inhaler Inhale 2 puffs into the lungs every 6 (six) hours as needed. For shortness of breath   Yes Historical Provider, MD  lansoprazole (PREVACID) 30 MG capsule Take 1 capsule (30 mg total) by mouth 2 (two) times daily. 07/20/12 07/20/13 Yes Corbin Ade, MD  sodium chloride (OCEAN) 0.65 % nasal spray Place 1 spray into the nose as needed for congestion. 07/14/12 07/14/13 Yes Tobin Chad, MD  fexofenadine (ALLEGRA) 180 MG tablet Take 1 tablet (180 mg total) by mouth daily. 07/14/12 07/14/13  Tobin Chad, MD  guaiFENesin (MUCINEX) 600 MG 12 hr tablet Take 1 tablet (600 mg total) by mouth 2 (two) times daily. 07/14/12 07/14/13  Tobin Chad, MD    Allergies as of 12/03/2012 - Review Complete 12/03/2012  Allergen Reaction Noted  . Sulfonamide derivatives Hives     No family history on file.  History   Social History  . Marital Status: Single    Spouse Name: N/A    Number of Children: N/A  . Years of Education: N/A   Occupational History  . Not on file.   Social History Main Topics  . Smoking status: Never Smoker   . Smokeless tobacco: Not on file  . Alcohol Use: No  . Drug Use: No  . Sexually Active:    Other Topics Concern  . Not on file   Social History Narrative  . No narrative on file    Review of Systems: See HPI, otherwise negative ROS  Physical Exam: BP 112/72  Pulse 92  Temp 98.4 F (36.9 C) (Oral)  Ht 5\' 1"  (1.549 m)  Wt 149 lb 9.6 oz (67.858 kg)  BMI 28.27 kg/m2  LMP 11/01/2012 General:   Alert,  Well-developed, well-nourished, pleasant and cooperative in NAD Skin:  Intact without significant lesions or rashes. Eyes:  Sclera clear, no icterus.   Conjunctiva pink. Ears:  Normal auditory acuity. Nose:  No deformity, discharge,  or lesions. Mouth:  No deformity or lesions. Neck:  Supple; no masses or thyromegaly. No significant cervical adenopathy. Lungs:  Clear throughout to auscultation.   No wheezes, crackles, or rhonchi. No acute distress. Heart:   Regular rate and rhythm; no murmurs, clicks, rubs,  or gallops. Abdomen: Non-distended, normal bowel sounds.  Soft and nontender without appreciable mass or hepatosplenomegaly.  Pulses:  Normal pulses noted. Extremities:  Without clubbing or edema.  Impression/Plan:  Pleasant 37 year old lady with recurrent retroxiphoid and retrosternal pressure and burning discomfort now with pill dysphagia. I suspect progressive weight gain is predisposing her to recurrent reflux symptoms. Dysphagia demands further evaluation. She really does not have abdominal pain, per say, so, I doubt we're dealing with occult gallbladder disease or other GI pathology at this time  Recommendations: Further evaluation of her luminal upper GI tract. We talked about the risk and benefits of the EGD versus a barium pill esophagram.. Offered her a barium pill esophagram but this may not get at the problem adequately and, if abnormal, she will likely still need an  EGD. We mutually agreed that EGD with or without dilation as appropriate would be the most expeditious course to take at this time.The risks, benefits, limitations, alternatives and imponderables have been reviewed with the patient. Potential for esophageal dilation, biopsy, etc. have also been reviewed.  Questions have been answered. All parties agreeable.   I have again provided her literature on GERD. Emphasized the importance of weight loss. I have asked this nice lady to lose 10 pounds over the next 12 months. Further recommendations to follow pending findings at EGD.

## 2012-12-10 ENCOUNTER — Encounter (HOSPITAL_COMMUNITY): Payer: Self-pay | Admitting: Internal Medicine

## 2012-12-15 ENCOUNTER — Telehealth: Payer: Self-pay | Admitting: *Deleted

## 2012-12-15 NOTE — Telephone Encounter (Signed)
[name] called today to set up her post op appt with Dr Jena Gauss. She wanted [place] to be aware that she believes that she is experiencing hair loss from her new medication and wants you to be aware prior to her appt to see if there is anything we can do for her. Please advise. Thank you.

## 2012-12-16 NOTE — Telephone Encounter (Signed)
Spoke with pt- she stated she has been taking lansoprazole bid since 2010-2011 and ever since she has been taking it,she has noticed a large increase in her hair falling out (she described it as shedding). She has gone to her hairdresser and has tried several different treatments and nothing seams to be helping. Pt stated she knows she has to take something for her reflux. She said the lansoprazole doesn't work well anyway, she still has episodes where her stomach "balls up into a knot". Pt has follow up ov in April but wants to know if there is anything she can take that will help her hair, either a different ppi or a supplement in addition to the lansoprazole that will help her hair.

## 2012-12-16 NOTE — Telephone Encounter (Signed)
Tried to call pt- LMOM 

## 2013-03-01 ENCOUNTER — Encounter: Payer: Self-pay | Admitting: Internal Medicine

## 2013-03-01 ENCOUNTER — Ambulatory Visit (INDEPENDENT_AMBULATORY_CARE_PROVIDER_SITE_OTHER): Payer: BC Managed Care – PPO | Admitting: Internal Medicine

## 2013-03-01 VITALS — BP 118/67 | HR 83 | Temp 98.2°F | Ht 61.0 in | Wt 151.0 lb

## 2013-03-01 DIAGNOSIS — K219 Gastro-esophageal reflux disease without esophagitis: Secondary | ICD-10-CM

## 2013-03-01 DIAGNOSIS — K649 Unspecified hemorrhoids: Secondary | ICD-10-CM

## 2013-03-01 NOTE — Progress Notes (Signed)
Primary Care Physician:  Milana Obey, MD Primary Gastroenterologist:  Dr. Jena Gauss  Pre-Procedure History & Physical: HPI:  [name] is a 37 y.o. female here for followup of GERD. EGD earlier this year demonstrated a normal esophagus. Dysphagia improved after empiric passage of a Maloney dilator. Still gaining weight 2 more pounds since last office visit. Requiring Prevacid 30 mg orally twice daily to control her reflux symptoms. Small hiatal hernia noted previously. Gallbladder negative on 2008 ultrasound.  Patient also complaining of intermittent burning itching and rectal bleeding when wiping. History of hemorrhoids. Takes Preparation H couple times monthly.  Past Medical History  Diagnosis Date  . GERD (gastroesophageal reflux disease)   . Helicobacter pylori (H. pylori)     prevpac    Past Surgical History  Procedure Laterality Date  . Esophagogastroduodenoscopy  09/14/2007    Dr. Isabella Stalling quadrant distal esophageal erosion consistant with  moderately severe erosive reflux esophagitits, o/w normal esophagus.,tiny antral erosions o/w normal stomach, inflammation on bx  . Appendectomy      37 years old  . Esophagogastroduodenoscopy (egd) with esophageal dilation  12/09/2012    Dr. Jena Gauss- normal esophagus s/p maloney dilation, small hiatal hernia    Prior to Admission medications   Medication Sig Start Date End Date Taking? Authorizing Provider  albuterol (PROVENTIL HFA;VENTOLIN HFA) 108 (90 BASE) MCG/ACT inhaler Inhale 2 puffs into the lungs every 6 (six) hours as needed. For shortness of breath   Yes Historical Provider, MD  fluticasone (FLONASE) 50 MCG/ACT nasal spray Place 2 sprays into the nose Once daily as needed. Congestion 11/24/12  Yes Historical Provider, MD  lansoprazole (PREVACID) 30 MG capsule Take 1 capsule (30 mg total) by mouth 2 (two) times daily. 07/20/12 07/20/13 Yes Corbin Ade, MD    Allergies as of 03/01/2013 - Review Complete 03/01/2013  Allergen  Reaction Noted  . Sulfonamide derivatives Hives     No family history on file.  History   Social History  . Marital Status: Single    Spouse Name: N/A    Number of Children: N/A  . Years of Education: N/A   Occupational History  . Not on file.   Social History Main Topics  . Smoking status: Never Smoker   . Smokeless tobacco: Not on file  . Alcohol Use: No  . Drug Use: No  . Sexually Active: No   Other Topics Concern  . Not on file   Social History Narrative  . No narrative on file    Review of Systems: See HPI, otherwise negative ROS  Physical Exam: BP 118/67  Pulse 83  Temp(Src) 98.2 F (36.8 C) (Oral)  Ht 5\' 1"  (1.549 m)  Wt 151 lb (68.493 kg)  BMI 28.55 kg/m2  LMP 02/01/2013 General:   Alert,  Well-developed, well-nourished, pleasant and cooperative in NAD Skin:  Intact without significant lesions or rashes. Eyes:  Sclera clear, no icterus.   Conjunctiva pink. Ears:  Normal auditory acuity. Nose:  No deformity, discharge,  or lesions. Mouth:  No deformity or lesions. Neck:  Supple; no masses or thyromegaly. No significant cervical adenopathy. Lungs:  Clear throughout to auscultation.   No wheezes, crackles, or rhonchi. No acute distress. Heart:  Regular rate and rhythm; no murmurs, clicks, rubs,  or gallops. Abdomen: Non-distended, normal bowel sounds.  Soft and nontender without appreciable mass or hepatosplenomegaly.  Pulses:  Normal pulses noted. Extremities:  Without clubbing or edema. Rectal:  1 external hemorrhoidal tag. Digital exam revealed no masses. Scant  brown in rectal vault.   Impression/Plan:  37 year old lady with GERD,  approximately 20-25 pounds over her ideal body weight. Discussed the multipronged approach regarding the management of reflux disease. Symptomatic hemorrhoidal disease.  Recommendations: Continue Prevacid 30 mg twice daily. 10 pound weight loss over the next 6 months. Antireflux measures reviewed  Two-week course of  Anusol-HC suppositories one per rectum at bedtime. Add Benefiber 2 tablespoons daily to her regimen. She may ultimately need surgical intervention. Will have her return a stool for occult blood testing. Office visit here in 6 months

## 2013-03-01 NOTE — Patient Instructions (Addendum)
Loose 10 pounds in the next 6 months  Stool for occult blood testing  Continue prevacid 30 mg twice daily  Office visit in 6 months  Add benefiber 2 Tbsp daily  Anusol suppositories at bedtime

## 2013-03-22 ENCOUNTER — Telehealth: Payer: Self-pay

## 2013-03-22 MED ORDER — LANSOPRAZOLE 30 MG PO CPDR: 30.0000 mg | DELAYED_RELEASE_CAPSULE | Freq: Two times a day (BID) | ORAL | Status: DC

## 2013-03-22 NOTE — Telephone Encounter (Signed)
Pt called she needs a new rx sent to [facility] pharmacy for her lansoprazole 30 mg bid sent to [facility] pharmacy.

## 2013-03-22 NOTE — Telephone Encounter (Signed)
done

## 2013-09-16 ENCOUNTER — Emergency Department (HOSPITAL_COMMUNITY): Payer: BC Managed Care – PPO

## 2013-09-16 ENCOUNTER — Emergency Department (HOSPITAL_COMMUNITY)
Admission: EM | Admit: 2013-09-16 | Discharge: 2013-09-16 | Disposition: A | Discharge status: Home / Self Care | Payer: BC Managed Care – PPO | Attending: Emergency Medicine | Admitting: Emergency Medicine

## 2013-09-16 ENCOUNTER — Encounter (HOSPITAL_COMMUNITY): Payer: Self-pay | Admitting: Emergency Medicine

## 2013-09-16 DIAGNOSIS — K219 Gastro-esophageal reflux disease without esophagitis: Secondary | ICD-10-CM | POA: Insufficient documentation

## 2013-09-16 DIAGNOSIS — Z79899 Other long term (current) drug therapy: Secondary | ICD-10-CM | POA: Insufficient documentation

## 2013-09-16 DIAGNOSIS — R42 Dizziness and giddiness: Secondary | ICD-10-CM | POA: Diagnosis not present

## 2013-09-16 DIAGNOSIS — Z8619 Personal history of other infectious and parasitic diseases: Secondary | ICD-10-CM | POA: Insufficient documentation

## 2013-09-16 DIAGNOSIS — K2970 Gastritis, unspecified, without bleeding: Secondary | ICD-10-CM | POA: Insufficient documentation

## 2013-09-16 DIAGNOSIS — R509 Fever, unspecified: Secondary | ICD-10-CM | POA: Diagnosis present

## 2013-09-16 DIAGNOSIS — R55 Syncope and collapse: Secondary | ICD-10-CM | POA: Diagnosis not present

## 2013-09-16 LAB — CBC WITH DIFFERENTIAL/PLATELET
Basophils Relative: 1 % (ref 0–1)
Eosinophils Absolute: 0 10*3/uL (ref 0.0–0.7)
Eosinophils Relative: 0 % (ref 0–5)
HCT: 39.5 % (ref 36.0–46.0)
Hemoglobin: 13.3 g/dL (ref 12.0–15.0)
MCH: 29.8 pg (ref 26.0–34.0)
MCHC: 33.7 g/dL (ref 30.0–36.0)
MCV: 88.6 fL (ref 78.0–100.0)
Monocytes Absolute: 0.3 10*3/uL (ref 0.1–1.0)
Monocytes Relative: 3 % (ref 3–12)

## 2013-09-16 LAB — COMPREHENSIVE METABOLIC PANEL
ALT: 9 U/L (ref 0–35)
Albumin: 4.1 g/dL (ref 3.5–5.2)
Alkaline Phosphatase: 98 U/L (ref 39–117)
Chloride: 104 mEq/L (ref 96–112)
Potassium: 3.9 mEq/L (ref 3.5–5.1)
Sodium: 141 mEq/L (ref 135–145)
Total Bilirubin: 0.2 mg/dL — ABNORMAL LOW (ref 0.3–1.2)
Total Protein: 8.3 g/dL (ref 6.0–8.3)

## 2013-09-16 LAB — URINALYSIS, ROUTINE W REFLEX MICROSCOPIC
Hgb urine dipstick: NEGATIVE
Nitrite: NEGATIVE
Specific Gravity, Urine: <1.005 — ABNORMAL LOW (ref 1.005–1.030)
Urobilinogen, UA: 0.2 mg/dL (ref 0.0–1.0)
pH: 7 (ref 5.0–8.0)

## 2013-09-16 MED ORDER — ONDANSETRON 4 MG PO TBDP: 4.0000 mg | ORAL_TABLET | Freq: Three times a day (TID) | ORAL | Status: DC | PRN

## 2013-09-16 MED ORDER — SODIUM CHLORIDE 0.9 % IV SOLN: INTRAVENOUS | Status: DC

## 2013-09-16 MED ORDER — SODIUM CHLORIDE 0.9 % IV BOLUS (SEPSIS)
1000.0000 mL | Freq: Once | INTRAVENOUS | Status: AC
  Administered 2013-09-16: 1000 mL via INTRAVENOUS

## 2013-09-16 MED ORDER — PROMETHAZINE HCL 25 MG PO TABS: 25.0000 mg | ORAL_TABLET | Freq: Four times a day (QID) | ORAL | Status: DC | PRN

## 2013-09-16 MED ORDER — ONDANSETRON HCL 4 MG/2ML IJ SOLN
4.0000 mg | Freq: Once | INTRAMUSCULAR | Status: DC
  Filled 2013-09-16: qty 2

## 2013-09-16 NOTE — ED Provider Notes (Signed)
CSN: [id number]     Arrival date & time 09/16/13  1033 History  This chart was scribed for Shelda Jakes, MD by Leone Payor, ED Scribe. This patient was seen in room APA03/APA03 and the patient's care was started 12:41 PM.    Chief Complaint  Patient presents with  . Fever  . Emesis  . Fatigue    HPI  HPI Comments: [name] is a 37 y.o. female who presents to the Emergency Department complaining of an episode of resolved dizziness, near syncope that occurred about 5 hours ago. Pt states she began to have nausea and vomiting after the dizziness subsided. Pt states she had about 3 episodes of vomiting today. Pt states she felt normal yesterday. She denies sick contacts. She denies diarrhea, abdominal pain, hematochezia, leg swelling.   LNMP Mid October   Past Medical History  Diagnosis Date  . GERD (gastroesophageal reflux disease)   . Helicobacter pylori (H. pylori)     prevpac   Past Surgical History  Procedure Laterality Date  . Esophagogastroduodenoscopy  09/14/2007    Dr. Isabella Stalling quadrant distal esophageal erosion consistant with  moderately severe erosive reflux esophagitits, o/w normal esophagus.,tiny antral erosions o/w normal stomach, inflammation on bx  . Appendectomy      37 years old  . Esophagogastroduodenoscopy (egd) with esophageal dilation  12/09/2012    Dr. Jena Gauss- normal esophagus s/p maloney dilation, small hiatal hernia   History reviewed. No pertinent family history. History  Substance Use Topics  . Smoking status: Never Smoker   . Smokeless tobacco: Never Used  . Alcohol Use: No   OB History   Grav Para Term Preterm Abortions TAB SAB Ect Mult Living            0     Review of Systems  Constitutional: Positive for fever and chills.  HENT: Negative for ear pain, rhinorrhea, sore throat and tinnitus.   Eyes: Negative for visual disturbance.  Respiratory: Negative for cough and shortness of breath.   Cardiovascular: Negative for chest  pain and leg swelling.  Gastrointestinal: Negative for abdominal pain.  Genitourinary: Negative for dysuria.  Musculoskeletal: Negative for back pain, myalgias and neck pain.  Skin: Negative for rash.  Neurological: Positive for dizziness. Negative for headaches.  Hematological: Does not bruise/bleed easily.  Psychiatric/Behavioral: Negative for confusion.    Allergies  Sulfonamide derivatives  Home Medications   Current Outpatient Rx  Name  Route  Sig  Dispense  Refill  . albuterol (PROVENTIL HFA;VENTOLIN HFA) 108 (90 BASE) MCG/ACT inhaler   Inhalation   Inhale 2 puffs into the lungs every 6 (six) hours as needed. For shortness of breath         . fluticasone (FLONASE) 50 MCG/ACT nasal spray   Nasal   Place 2 sprays into the nose Once daily as needed. Congestion         . lansoprazole (PREVACID) 30 MG capsule   Oral   Take 1 capsule (30 mg total) by mouth 2 (two) times daily.   60 capsule   5   . loratadine (CLARITIN) 10 MG tablet   Oral   Take 10 mg by mouth daily as needed for allergies.         Marland Kitchen ondansetron (ZOFRAN ODT) 4 MG disintegrating tablet   Oral   Take 1 tablet (4 mg total) by mouth every 8 (eight) hours as needed.   12 tablet   0   . promethazine (PHENERGAN) 25 MG tablet  Oral   Take 1 tablet (25 mg total) by mouth every 6 (six) hours as needed for nausea or vomiting.   12 tablet   0    BP 106/54  Pulse 83  Temp(Src) 98.4 F (36.9 C) (Oral)  Resp 18  Ht 5\' 1"  (1.549 m)  Wt 130 lb (58.968 kg)  BMI 24.58 kg/m2  SpO2 100%  LMP 08/26/2013 Physical Exam  Nursing note and vitals reviewed. Constitutional: She is oriented to person, place, and time. She appears well-developed and well-nourished.  HENT:  Head: Normocephalic and atraumatic.  Mouth/Throat: Mucous membranes are normal.  Eyes: EOM are normal.  Cardiovascular: Normal rate, regular rhythm and normal heart sounds.   Pulmonary/Chest: Effort normal and breath sounds normal. No  respiratory distress. She has no wheezes. She has no rales. She exhibits no tenderness.  Abdominal: Soft. Bowel sounds are normal. She exhibits no distension. There is no tenderness.  Musculoskeletal: She exhibits no edema.  Neurological: She is alert and oriented to person, place, and time. No cranial nerve deficit.  Skin: Skin is warm and dry.  Psychiatric: She has a normal mood and affect.    ED Course  Procedures   DIAGNOSTIC STUDIES: Oxygen Saturation is 100% on RA, normal by my interpretation.    COORDINATION OF CARE: 12:44 PM Discussed treatment plan with pt at bedside and pt agreed to plan.   Labs Review Labs Reviewed  URINALYSIS, ROUTINE W REFLEX MICROSCOPIC - Abnormal; Notable for the following:    Specific Gravity, Urine <1.005 (*)    All other components within normal limits  COMPREHENSIVE METABOLIC PANEL - Abnormal; Notable for the following:    Glucose, Bld 104 (*)    Total Bilirubin 0.2 (*)    All other components within normal limits  CBC WITH DIFFERENTIAL - Abnormal; Notable for the following:    Platelets 427 (*)    Neutrophils Relative % 85 (*)    All other components within normal limits  LIPASE, BLOOD   Results for orders placed during the hospital encounter of 09/16/13  URINALYSIS, ROUTINE W REFLEX MICROSCOPIC      Result Value Range   Color, Urine YELLOW  YELLOW   APPearance CLEAR  CLEAR   Specific Gravity, Urine <1.005 (*) 1.005 - 1.030   pH 7.0  5.0 - 8.0   Glucose, UA NEGATIVE  NEGATIVE mg/dL   Hgb urine dipstick NEGATIVE  NEGATIVE   Bilirubin Urine NEGATIVE  NEGATIVE   Ketones, ur NEGATIVE  NEGATIVE mg/dL   Protein, ur NEGATIVE  NEGATIVE mg/dL   Urobilinogen, UA 0.2  0.0 - 1.0 mg/dL   Nitrite NEGATIVE  NEGATIVE   Leukocytes, UA NEGATIVE  NEGATIVE  COMPREHENSIVE METABOLIC PANEL      Result Value Range   Sodium 141  135 - 145 mEq/L   Potassium 3.9  3.5 - 5.1 mEq/L   Chloride 104  96 - 112 mEq/L   CO2 26  19 - 32 mEq/L   Glucose, Bld 104  (*) 70 - 99 mg/dL   BUN 8  6 - 23 mg/dL   Creatinine, Ser 9.60  0.50 - 1.10 mg/dL   Calcium 45.4  8.4 - 09.8 mg/dL   Total Protein 8.3  6.0 - 8.3 g/dL   Albumin 4.1  3.5 - 5.2 g/dL   AST 17  0 - 37 U/L   ALT 9  0 - 35 U/L   Alkaline Phosphatase 98  39 - 117 U/L   Total Bilirubin 0.2 (*)  0.3 - 1.2 mg/dL   GFR calc non Af Amer >90  >90 mL/min   GFR calc Af Amer >90  >90 mL/min  LIPASE, BLOOD      Result Value Range   Lipase 51  11 - 59 U/L  CBC WITH DIFFERENTIAL      Result Value Range   WBC 8.6  4.0 - 10.5 K/uL   RBC 4.46  3.87 - 5.11 MIL/uL   Hemoglobin 13.3  12.0 - 15.0 g/dL   HCT 16.1  09.6 - 04.5 %   MCV 88.6  78.0 - 100.0 fL   MCH 29.8  26.0 - 34.0 pg   MCHC 33.7  30.0 - 36.0 g/dL   RDW 40.9  81.1 - 91.4 %   Platelets 427 (*) 150 - 400 K/uL   Neutrophils Relative % 85 (*) 43 - 77 %   Neutro Abs 7.3  1.7 - 7.7 K/uL   Lymphocytes Relative 12  12 - 46 %   Lymphs Abs 1.0  0.7 - 4.0 K/uL   Monocytes Relative 3  3 - 12 %   Monocytes Absolute 0.3  0.1 - 1.0 K/uL   Eosinophils Relative 0  0 - 5 %   Eosinophils Absolute 0.0  0.0 - 0.7 K/uL   Basophils Relative 1  0 - 1 %   Basophils Absolute 0.0  0.0 - 0.1 K/uL    Imaging Review Dg Abd Acute W/chest  09/16/2013   CLINICAL DATA:  Fever, fatigue, vomiting  EXAM: ACUTE ABDOMEN SERIES (ABDOMEN 2 VIEW & CHEST 1 VIEW)  COMPARISON:  Prior chest x-ray 07/14/2012  FINDINGS: There is no evidence of dilated bowel loops or free intraperitoneal air. No radiopaque calculi or other significant radiographic abnormality is seen.  Heart size and mediastinal contours are within normal limits. Inspiratory volumes are low and there is mild bibasilar atelectasis.  IMPRESSION: Negative abdominal radiographs.  No acute cardiopulmonary disease.   Electronically Signed   By: Malachy Moan M.D.   On: 09/16/2013 14:21    EKG Interpretation   None       MDM   1. Gastritis    Patient is nontoxic no acute distress. Patient was sudden onset of  vomiting a little bit of dizziness low bit of vertigo feels much better now. Abdomen is nonsurgical x-rays were negative no leukocytosis no lab abnormalities. Suspect this may be a viral gastritis. Patient improved with fluids and anti-nausea medicines. Will discharge home with antinausea medicines.  I personally performed the services described in this documentation, which was scribed in my presence. The recorded information has been reviewed and is accurate.    Shelda Jakes, MD 09/16/13 1452

## 2013-09-16 NOTE — ED Notes (Signed)
Patient c/o fever, vomiting, and generalized weakness that started this morning. Per patient vomited twice this morning. Denies any diarrhea, urinary symptoms, or pain.

## 2013-12-29 ENCOUNTER — Ambulatory Visit: Payer: BC Managed Care – PPO | Admitting: Gastroenterology

## 2014-01-13 ENCOUNTER — Encounter (INDEPENDENT_AMBULATORY_CARE_PROVIDER_SITE_OTHER): Payer: Self-pay

## 2014-01-13 ENCOUNTER — Encounter: Payer: Self-pay | Admitting: Gastroenterology

## 2014-01-13 ENCOUNTER — Ambulatory Visit (INDEPENDENT_AMBULATORY_CARE_PROVIDER_SITE_OTHER): Payer: BC Managed Care – PPO | Admitting: Gastroenterology

## 2014-01-13 VITALS — BP 130/70 | HR 83 | Temp 98.3°F | Ht 61.0 in | Wt 156.6 lb

## 2014-01-13 DIAGNOSIS — R109 Unspecified abdominal pain: Secondary | ICD-10-CM

## 2014-01-13 NOTE — Progress Notes (Signed)
Patient rescheduled appointment. Wanted to see Dr. Gala Hardin only.

## 2014-02-17 ENCOUNTER — Ambulatory Visit: Payer: BC Managed Care – PPO | Admitting: Internal Medicine

## 2014-03-21 ENCOUNTER — Ambulatory Visit: Payer: BC Managed Care – PPO | Admitting: Internal Medicine

## 2015-11-03 ENCOUNTER — Encounter (HOSPITAL_COMMUNITY): Payer: Self-pay | Admitting: *Deleted

## 2015-11-03 ENCOUNTER — Emergency Department (HOSPITAL_COMMUNITY)
Admission: EM | Admit: 2015-11-03 | Discharge: 2015-11-03 | Disposition: A | Discharge status: Home / Self Care | Payer: BC Managed Care – PPO | Attending: Emergency Medicine | Admitting: Emergency Medicine

## 2015-11-03 DIAGNOSIS — Z79899 Other long term (current) drug therapy: Secondary | ICD-10-CM | POA: Insufficient documentation

## 2015-11-03 DIAGNOSIS — K529 Noninfective gastroenteritis and colitis, unspecified: Secondary | ICD-10-CM | POA: Diagnosis not present

## 2015-11-03 DIAGNOSIS — Z8619 Personal history of other infectious and parasitic diseases: Secondary | ICD-10-CM | POA: Diagnosis not present

## 2015-11-03 DIAGNOSIS — R1110 Vomiting, unspecified: Secondary | ICD-10-CM | POA: Diagnosis present

## 2015-11-03 DIAGNOSIS — K219 Gastro-esophageal reflux disease without esophagitis: Secondary | ICD-10-CM | POA: Diagnosis not present

## 2015-11-03 LAB — URINE MICROSCOPIC-ADD ON

## 2015-11-03 LAB — URINALYSIS, ROUTINE W REFLEX MICROSCOPIC
Bilirubin Urine: NEGATIVE
Glucose, UA: NEGATIVE mg/dL
Hgb urine dipstick: NEGATIVE
Ketones, ur: 15 mg/dL — AB
LEUKOCYTES UA: NEGATIVE
NITRITE: NEGATIVE
Protein, ur: 30 mg/dL — AB
SPECIFIC GRAVITY, URINE: 1.015 (ref 1.005–1.030)

## 2015-11-03 LAB — COMPREHENSIVE METABOLIC PANEL
ALBUMIN: 4.4 g/dL (ref 3.5–5.0)
ALT: 16 U/L (ref 14–54)
ANION GAP: 9 (ref 5–15)
AST: 22 U/L (ref 15–41)
Alkaline Phosphatase: 91 U/L (ref 38–126)
BILIRUBIN TOTAL: 0.4 mg/dL (ref 0.3–1.2)
BUN: 10 mg/dL (ref 6–20)
CHLORIDE: 105 mmol/L (ref 101–111)
CO2: 24 mmol/L (ref 22–32)
Calcium: 9.7 mg/dL (ref 8.9–10.3)
Creatinine, Ser: 0.79 mg/dL (ref 0.44–1.00)
GFR calc Af Amer: >60 mL/min (ref >60)
Glucose, Bld: 112 mg/dL — ABNORMAL HIGH (ref 65–99)
POTASSIUM: 3.7 mmol/L (ref 3.5–5.1)
Sodium: 138 mmol/L (ref 135–145)
TOTAL PROTEIN: 8.1 g/dL (ref 6.5–8.1)

## 2015-11-03 LAB — CBC
HEMATOCRIT: 36.8 % (ref 36.0–46.0)
HEMOGLOBIN: 12 g/dL (ref 12.0–15.0)
MCH: 27.1 pg (ref 26.0–34.0)
MCHC: 32.6 g/dL (ref 30.0–36.0)
MCV: 83.1 fL (ref 78.0–100.0)
Platelets: 442 10*3/uL — ABNORMAL HIGH (ref 150–400)
RBC: 4.43 MIL/uL (ref 3.87–5.11)
RDW: 15.3 % (ref 11.5–15.5)
WBC: 11.2 10*3/uL — AB (ref 4.0–10.5)

## 2015-11-03 LAB — LIPASE, BLOOD: LIPASE: 32 U/L (ref 11–51)

## 2015-11-03 MED ORDER — MORPHINE SULFATE (PF) 4 MG/ML IV SOLN
4.0000 mg | Freq: Once | INTRAVENOUS | Status: AC
  Administered 2015-11-03: 4 mg via INTRAVENOUS
  Filled 2015-11-03: qty 1

## 2015-11-03 MED ORDER — SODIUM CHLORIDE 0.9 % IV BOLUS (SEPSIS)
1000.0000 mL | Freq: Once | INTRAVENOUS | Status: AC
  Administered 2015-11-03: 1000 mL via INTRAVENOUS

## 2015-11-03 MED ORDER — ONDANSETRON HCL 4 MG/2ML IJ SOLN
INTRAMUSCULAR | Status: AC
  Administered 2015-11-03: 4 mg via INTRAVENOUS
  Filled 2015-11-03: qty 2

## 2015-11-03 MED ORDER — ONDANSETRON HCL 4 MG/2ML IJ SOLN
4.0000 mg | Freq: Once | INTRAMUSCULAR | Status: AC | PRN
  Administered 2015-11-03: 4 mg via INTRAVENOUS

## 2015-11-03 MED ORDER — DIPHENOXYLATE-ATROPINE 2.5-0.025 MG PO TABS: 1.0000 | ORAL_TABLET | Freq: Four times a day (QID) | ORAL | Status: DC | PRN

## 2015-11-03 MED ORDER — ONDANSETRON HCL 8 MG PO TABS: 8.0000 mg | ORAL_TABLET | ORAL | Status: DC | PRN

## 2015-11-03 NOTE — Discharge Instructions (Signed)
Clear liquids tonight.   Medications for nausea and diarrhea. Return if worse.

## 2015-11-03 NOTE — ED Provider Notes (Signed)
CSN: [id number]     Arrival date & time 11/03/15  1118 History   First MD Initiated Contact with Patient 11/03/15 1152     Chief Complaint  Patient presents with  . Emesis  . Diarrhea     (Consider location/radiation/quality/duration/timing/severity/associated sxs/prior Treatment) HPI..... Multiple episodes of nausea, vomiting and diarrhea since early this morning. This is unusual for her. No fever, sweats, chills, dysuria, dyspnea, chest pain. She has tried nothing at home. Severity is moderate. Review systems positive for cramping  Past Medical History  Diagnosis Date  . GERD (gastroesophageal reflux disease)   . Helicobacter pylori (H. pylori)     prevpac  . Hemorrhoids    Past Surgical History  Procedure Laterality Date  . Esophagogastroduodenoscopy  09/14/2007    Dr. Hurley Cisco quadrant distal esophageal erosion consistant with  moderately severe erosive reflux esophagitits, o/w normal esophagus.,tiny antral erosions o/w normal stomach, inflammation on bx  . Appendectomy      39 years old  . Esophagogastroduodenoscopy (egd) with esophageal dilation  12/09/2012    Dr. Gala Romney- normal esophagus s/p maloney dilation, small hiatal hernia   No family history on file. Social History  Substance Use Topics  . Smoking status: Never Smoker   . Smokeless tobacco: Never Used  . Alcohol Use: No   OB History    Gravida Para Term Preterm AB TAB SAB Ectopic Multiple Living            0     Review of Systems  All other systems reviewed and are negative.     Allergies  Sulfonamide derivatives  Home Medications   Prior to Admission medications   Medication Sig Start Date End Date Taking? Authorizing Provider  albuterol (PROVENTIL HFA;VENTOLIN HFA) 108 (90 BASE) MCG/ACT inhaler Inhale 2 puffs into the lungs every 6 (six) hours as needed. For shortness of breath   Yes Historical Provider, MD  fluticasone (FLONASE) 50 MCG/ACT nasal spray Place 2 sprays into the nose Once daily as  needed. Congestion 11/24/12  Yes Historical Provider, MD  lansoprazole (PREVACID) 30 MG capsule Take 1 capsule (30 mg total) by mouth 2 (two) times daily. 03/22/13 11/03/15 Yes Mahala Menghini, PA-C  Multiple Vitamins-Minerals (MULTIVITAMIN ADULT PO) Take 1 tablet by mouth daily.   Yes Historical Provider, MD  diphenoxylate-atropine (LOMOTIL) 2.5-0.025 MG tablet Take 1 tablet by mouth 4 (four) times daily as needed for diarrhea or loose stools. 11/03/15   Nat Christen, MD  ondansetron (ZOFRAN) 8 MG tablet Take 1 tablet (8 mg total) by mouth every 4 (four) hours as needed. 11/03/15   Nat Christen, MD   BP 117/73 mmHg  Pulse 95  Temp(Src) 99.5 F (37.5 C) (Oral)  Resp 16  Ht 5\' 1"  (1.549 m)  Wt 155 lb 9.6 oz (70.58 kg)  BMI 29.42 kg/m2  SpO2 99%  LMP 10/27/2015 Physical Exam  Constitutional: She is oriented to person, place, and time. She appears well-developed and well-nourished.  HENT:  Head: Normocephalic and atraumatic.  Eyes: Conjunctivae and EOM are normal. Pupils are equal, round, and reactive to light.  Neck: Normal range of motion. Neck supple.  Cardiovascular: Normal rate and regular rhythm.   Pulmonary/Chest: Effort normal and breath sounds normal.  Abdominal: Soft. Bowel sounds are normal.  Nontender.  Musculoskeletal: Normal range of motion.  Neurological: She is alert and oriented to person, place, and time.  Skin: Skin is warm and dry.  Psychiatric: She has a normal mood and affect. Her behavior is normal.  Nursing note and vitals reviewed.   ED Course  Procedures (including critical care time) Labs Review Labs Reviewed  COMPREHENSIVE METABOLIC PANEL - Abnormal; Notable for the following:    Glucose, Bld 112 (*)    All other components within normal limits  CBC - Abnormal; Notable for the following:    WBC 11.2 (*)    Platelets 442 (*)    All other components within normal limits  URINALYSIS, ROUTINE W REFLEX MICROSCOPIC (NOT AT [facility]) - Abnormal; Notable for the  following:    APPearance HAZY (*)    pH >9.0 (*)    Ketones, ur 15 (*)    Protein, ur 30 (*)    All other components within normal limits  URINE MICROSCOPIC-ADD ON - Abnormal; Notable for the following:    Squamous Epithelial / LPF 0-5 (*)    Bacteria, UA FEW (*)    All other components within normal limits  LIPASE, BLOOD    Imaging Review No results found. I have personally reviewed and evaluated these images and lab results as part of my medical decision-making.   EKG Interpretation None      MDM   Final diagnoses:  Gastroenteritis    Patient feels better after 2 L of IV fluids. No acute abdomen. Vital signs are stable. Discharge medications Zofran 8 mg and Lomotil.    Nat Christen, MD 11/03/15 [phone]

## 2015-11-03 NOTE — ED Notes (Signed)
Pt comes in with emesis and diarrhea starting this morning. Pt denies any specific pain but states her stomach does ache.

## 2016-11-23 ENCOUNTER — Emergency Department (HOSPITAL_COMMUNITY)
Admission: EM | Admit: 2016-11-23 | Discharge: 2016-11-23 | Disposition: A | Discharge status: Home / Self Care | Payer: BC Managed Care – PPO | Attending: Emergency Medicine | Admitting: Emergency Medicine

## 2016-11-23 ENCOUNTER — Encounter (HOSPITAL_COMMUNITY): Payer: Self-pay | Admitting: Emergency Medicine

## 2016-11-23 ENCOUNTER — Emergency Department (HOSPITAL_COMMUNITY): Payer: BC Managed Care – PPO

## 2016-11-23 DIAGNOSIS — B349 Viral infection, unspecified: Secondary | ICD-10-CM | POA: Insufficient documentation

## 2016-11-23 DIAGNOSIS — Z79899 Other long term (current) drug therapy: Secondary | ICD-10-CM | POA: Insufficient documentation

## 2016-11-23 DIAGNOSIS — R110 Nausea: Secondary | ICD-10-CM | POA: Diagnosis not present

## 2016-11-23 DIAGNOSIS — J3489 Other specified disorders of nose and nasal sinuses: Secondary | ICD-10-CM | POA: Diagnosis present

## 2016-11-23 MED ORDER — OSELTAMIVIR PHOSPHATE 75 MG PO CAPS: 75.0000 mg | ORAL_CAPSULE | Freq: Two times a day (BID) | ORAL | 0 refills | Status: DC

## 2016-11-23 MED ORDER — ONDANSETRON 8 MG PO TBDP
8.0000 mg | ORAL_TABLET | Freq: Once | ORAL | Status: AC
  Administered 2016-11-23: 8 mg via ORAL
  Filled 2016-11-23: qty 1

## 2016-11-23 MED ORDER — ACETAMINOPHEN 500 MG PO TABS
1000.0000 mg | ORAL_TABLET | Freq: Once | ORAL | Status: AC
  Administered 2016-11-23: 1000 mg via ORAL
  Filled 2016-11-23: qty 2

## 2016-11-23 MED ORDER — IBUPROFEN 400 MG PO TABS
400.0000 mg | ORAL_TABLET | Freq: Once | ORAL | Status: AC
Start: 2016-11-23 — End: 2016-11-23
  Administered 2016-11-23: 400 mg via ORAL
  Filled 2016-11-23: qty 1

## 2016-11-23 MED ORDER — BENZONATATE 100 MG PO CAPS: 100.0000 mg | ORAL_CAPSULE | Freq: Three times a day (TID) | ORAL | 0 refills | Status: DC | PRN

## 2016-11-23 MED ORDER — IBUPROFEN 400 MG PO TABS
ORAL_TABLET | ORAL | Status: AC
  Filled 2016-11-23: qty 1

## 2016-11-23 MED ORDER — ONDANSETRON 4 MG PO TBDP: 4.0000 mg | ORAL_TABLET | Freq: Three times a day (TID) | ORAL | 0 refills | Status: DC | PRN

## 2016-11-23 NOTE — ED Triage Notes (Signed)
Patient c/o generalized body aches, with chills, and vomiting. Denies any diarrhea, cough, or urinary symptoms. Unsure of any fever.

## 2016-11-23 NOTE — ED Notes (Signed)
Pt dropped ibuprofen as it went into her mouth- override to give pt meds ordered

## 2016-11-23 NOTE — ED Provider Notes (Signed)
[facility] DEPT Provider Note   CSN: [id number] Arrival date & time: 11/23/16  1519     History   Chief Complaint Chief Complaint  Patient presents with  . Generalized Body Aches    HPI [name] is a 41 y.o. female.  HPI  Pt was seen at 1750.  Per pt, c/o gradual onset and persistence of constant runny/stuffy nose, sinus congestion, and cough for the past 2 days. Has been associated with generalized body aches/fatigue, "chills," and several episodes of N/V. Denies rash, no CP/SOB, no diarrhea, no abd pain.    Past Medical History:  Diagnosis Date  . GERD (gastroesophageal reflux disease)   . Helicobacter pylori (H. pylori)    prevpac  . Hemorrhoids     Patient Active Problem List   Diagnosis Date Noted  . GERD 03/07/2010  . EPIGASTRIC PAIN 03/07/2010  . HELICOBACTER PYLORI INFECTION, HX OF 03/07/2010    Past Surgical History:  Procedure Laterality Date  . APPENDECTOMY     40 years old  . ESOPHAGOGASTRODUODENOSCOPY  09/14/2007   Dr. Hurley Cisco quadrant distal esophageal erosion consistant with  moderately severe erosive reflux esophagitits, o/w normal esophagus.,tiny antral erosions o/w normal stomach, inflammation on bx  . ESOPHAGOGASTRODUODENOSCOPY (EGD) WITH ESOPHAGEAL DILATION  12/09/2012   Dr. Gala Romney- normal esophagus s/p maloney dilation, small hiatal hernia    OB History    Gravida Para Term Preterm AB Living             0   SAB TAB Ectopic Multiple Live Births                   Home Medications    Prior to Admission medications   Medication Sig Start Date End Date Taking? Authorizing Provider  cetirizine (ZYRTEC) 10 MG tablet Take 10 mg by mouth daily.   Yes Historical Provider, MD  Multiple Vitamins-Minerals (MULTIVITAMIN ADULT PO) Take 1 tablet by mouth daily.   Yes Historical Provider, MD  omeprazole (PRILOSEC) 20 MG capsule Take 20 mg by mouth 2 (two) times daily before a meal.   Yes Historical Provider, MD    Family  History   Social History Social History  Substance Use Topics  . Smoking status: Never Smoker  . Smokeless tobacco: Never Used  . Alcohol use No     Allergies   Sulfonamide derivatives   Review of Systems Review of Systems ROS: Statement: All systems negative except as marked or noted in the HPI; Constitutional: +chills, generalized body aches/fatigue. ; ; Eyes: Negative for eye pain, redness and discharge. ; ; ENMT: Negative for ear pain, hoarseness, sore throat. +nasal congestion, sinus pressure and rhinorrhea. ; ; Cardiovascular: Negative for chest pain, palpitations, diaphoresis, dyspnea and peripheral edema. ; ; Respiratory: +cough. Negative for wheezing and stridor. ; ; Gastrointestinal: +N/V. Negative for diarrhea, abdominal pain, blood in stool, hematemesis, jaundice and rectal bleeding. . ; ; Genitourinary: Negative for dysuria, flank pain and hematuria. ; ; Musculoskeletal: Negative for back pain and neck pain. Negative for swelling and trauma.; ; Skin: Negative for pruritus, rash, abrasions, blisters, bruising and skin lesion.; ; Neuro: Negative for headache, lightheadedness and neck stiffness. Negative for weakness, altered level of consciousness, altered mental status, extremity weakness, paresthesias, involuntary movement, seizure and syncope.      Physical Exam Updated Vital Signs BP 146/100 (BP Location: Left Arm)   Pulse 104   Temp 99 F (37.2 C) (Oral)   Resp 24   Ht 5\' 1"  (1.549 m)  Wt 150 lb (68 kg)   LMP 11/16/2016   SpO2 100%   BMI 28.34 kg/m   Physical Exam 1755: Physical examination:  Nursing notes reviewed; Vital signs and O2 SAT reviewed;  Constitutional: Well developed, Well nourished, Well hydrated, In no acute distress; Head:  Normocephalic, atraumatic; Eyes: EOMI, PERRL, No scleral icterus; ENMT: TM's clear bilat. +edemetous nasal turbinates bilat with clear rhinorrhea. Mouth and pharynx normal, Mucous membranes moist; Neck: Supple, Full range of  motion, No lymphadenopathy; Cardiovascular: Regular rate and rhythm, No gallop; Respiratory: Breath sounds clear & equal bilaterally, No wheezes.  Speaking full sentences with ease, Normal respiratory effort/excursion; Chest: Nontender, Movement normal; Abdomen: Soft, Nontender, Nondistended, Normal bowel sounds; Genitourinary: No CVA tenderness; Extremities: Pulses normal, No tenderness, No edema, No calf edema or asymmetry.; Neuro: AA&Ox3, Major CN grossly intact.  Speech clear. No gross focal motor or sensory deficits in extremities.; Skin: Color normal, Warm, Dry.   ED Treatments / Results  Labs (all labs ordered are listed, but only abnormal results are displayed)   EKG  EKG Interpretation None       Radiology   Procedures Procedures (including critical care time)  Medications Ordered in ED Medications  ondansetron (ZOFRAN-ODT) disintegrating tablet 8 mg (not administered)  acetaminophen (TYLENOL) tablet 1,000 mg (not administered)  ibuprofen (ADVIL,MOTRIN) tablet 400 mg (not administered)     Initial Impression / Assessment and Plan / ED Course  I have reviewed the triage vital signs and the nursing notes.  Pertinent labs & imaging results that were available during my care of the patient were reviewed by me and considered in my medical decision making (see chart for details).  MDM Reviewed: previous chart, nursing note and vitals Interpretation: x-ray    Dg Chest 2 View Result Date: 11/23/2016 CLINICAL DATA:  Cough started last night with body aches. EXAM: CHEST  2 VIEW COMPARISON:  09/16/2013 FINDINGS: Low volume film. The lungs are clear wiithout focal pneumonia, edema, pneumothorax or pleural effusion. Subsegmental atelectasis noted right lung base. The cardiopericardial silhouette is within normal limits for size. The visualized bony structures of the thorax are intact. IMPRESSION: Low volume film without acute cardiopulmonary findings. Electronically Signed   By:  Misty Stanley M.D.   On: 11/23/2016 18:43    1930:  APAP and motrin given for fever. Pt has tol PO well without N/V. No stooling while in the ED. XR reassuring. Tx symptomatically at this time. Dx and testing d/w pt and family.  Questions answered.  Verb understanding, agreeable to d/c home with outpt f/u.   Final Clinical Impressions(s) / ED Diagnoses   Final diagnoses:  None    New Prescriptions New Prescriptions   No medications on file     Francine Graven, DO 11/26/16 [id number]

## 2016-11-23 NOTE — Discharge Instructions (Signed)
Take over the counter tylenol and ibuprofen, as directed on packaging, as needed for discomfort or fever. Take the prescriptions as directed. Call your regular medical doctor tomorrow to schedule a follow up appointment in the next 2 to 3 days.  Return to the Emergency Department immediately if worsening.

## 2016-11-27 ENCOUNTER — Emergency Department (HOSPITAL_COMMUNITY)
Admission: EM | Admit: 2016-11-27 | Discharge: 2016-11-27 | Disposition: A | Discharge status: Home / Self Care | Payer: BC Managed Care – PPO | Attending: Emergency Medicine | Admitting: Emergency Medicine

## 2016-11-27 ENCOUNTER — Encounter (HOSPITAL_COMMUNITY): Payer: Self-pay

## 2016-11-27 DIAGNOSIS — R251 Tremor, unspecified: Secondary | ICD-10-CM | POA: Diagnosis present

## 2016-11-27 DIAGNOSIS — T375X5A Adverse effect of antiviral drugs, initial encounter: Secondary | ICD-10-CM | POA: Insufficient documentation

## 2016-11-27 DIAGNOSIS — Z79899 Other long term (current) drug therapy: Secondary | ICD-10-CM | POA: Insufficient documentation

## 2016-11-27 DIAGNOSIS — T50905A Adverse effect of unspecified drugs, medicaments and biological substances, initial encounter: Secondary | ICD-10-CM

## 2016-11-27 MED ORDER — LORAZEPAM 2 MG/ML IJ SOLN
1.0000 mg | Freq: Once | INTRAMUSCULAR | Status: AC
  Administered 2016-11-27: 1 mg via INTRAMUSCULAR
  Filled 2016-11-27: qty 1

## 2016-11-27 NOTE — ED Triage Notes (Signed)
Pt states she started tamiflu on Tuesday, states she took doses on Tuesday and Wednesday and Wednesday night she started feeling shaky and couldn't sleep.  Pt denies pain

## 2016-11-27 NOTE — ED Provider Notes (Signed)
TIME SEEN: 5:25 AM  CHIEF COMPLAINT: Feeling anxious, unable to sleep, vomiting, shaking  HPI: Pt is a 41 y.o. female with history of GERD who presents the emergency department with possible medication reaction. She was seen on 11/23/16 for nasal congestion, cough, fever. Had a chest x-ray at that time that was clear. Was thought to have possible influenza or other viral syndrome. Discharged home with [employer], Zofran and TM of flu. Reports she has had 4 doses of Tamiflu, last being at 10:53 PM last night. States that she woke this morning and felt shaky all over, anxious and has not been able to sleep well. Also reports she has had nausea and vomiting. No diarrhea. Denies any pain currently. No fever. She does not think this is the [employer] or Zofran. She is concerned this is from the Tamiflu. No lip or tongue swelling, wheezing, urticaria. No difficulty speaking or breathing.  ROS: See HPI Constitutional: no fever  Eyes: no drainage  ENT: no runny nose   Cardiovascular:  no chest pain  Resp: no SOB  GI: no vomiting GU: no dysuria Integumentary: no rash  Allergy: no hives  Musculoskeletal: no leg swelling  Neurological: no slurred speech ROS otherwise negative  PAST MEDICAL HISTORY/PAST SURGICAL HISTORY:  Past Medical History:  Diagnosis Date  . GERD (gastroesophageal reflux disease)   . Helicobacter pylori (H. pylori)    prevpac  . Hemorrhoids     MEDICATIONS:  Prior to Admission medications   Medication Sig Start Date End Date Taking? Authorizing Provider  benzonatate (TESSALON) 100 MG capsule Take 1 capsule (100 mg total) by mouth 3 (three) times daily as needed for cough. 11/23/16  Yes Francine Graven, DO  cetirizine (ZYRTEC) 10 MG tablet Take 10 mg by mouth daily.   Yes Historical Provider, MD  Multiple Vitamins-Minerals (MULTIVITAMIN ADULT PO) Take 1 tablet by mouth daily.   Yes Historical Provider, MD  omeprazole (PRILOSEC) 20 MG capsule Take 20 mg by mouth 2  (two) times daily before a meal.   Yes Historical Provider, MD  ondansetron (ZOFRAN ODT) 4 MG disintegrating tablet Take 1 tablet (4 mg total) by mouth every 8 (eight) hours as needed for nausea or vomiting. 11/23/16  Yes Francine Graven, DO  oseltamivir (TAMIFLU) 75 MG capsule Take 1 capsule (75 mg total) by mouth every 12 (twelve) hours. 11/23/16  Yes Francine Graven, DO    ALLERGIES:  Allergies  Allergen Reactions  . Sulfonamide Derivatives Hives    SOCIAL HISTORY:  Social History  Substance Use Topics  . Smoking status: Never Smoker  . Smokeless tobacco: Never Used  . Alcohol use No    FAMILY HISTORY: No family history on file.  EXAM: BP 128/88   Pulse 93   Temp 98.1 F (36.7 C)   Resp 22   Ht 5\' 1"  (1.549 m)   Wt 132 lb (59.9 kg)   LMP 11/16/2016   SpO2 100%   BMI 24.94 kg/m  CONSTITUTIONAL: Alert and oriented and responds appropriately to questions. Well-appearing; well-nourished, Appears anxious, afebrile, nontoxic, well-hydrated HEAD: Normocephalic EYES: Conjunctivae clear, PERRL, EOMI ENT: normal nose; no rhinorrhea; moist mucous membranes; No pharyngeal erythema or petechiae, no tonsillar hypertrophy or exudate, no uvular deviation, no unilateral swelling, no trismus or drooling, no muffled voice, normal phonation, no stridor, no dental caries present, no drainable dental abscess noted, no Ludwig's angina, tongue sits flat in the bottom of the mouth, no angioedema, no facial erythema or warmth, no facial swelling; no  pain with movement of the neck NECK: Supple, no meningismus, no nuchal rigidity, no LAD  CARD: RRR; S1 and S2 appreciated; no murmurs, no clicks, no rubs, no gallops RESP: Normal chest excursion without splinting or tachypnea; breath sounds clear and equal bilaterally; no wheezes, no rhonchi, no rales, no hypoxia or respiratory distress, speaking full sentences ABD/GI: Normal bowel sounds; non-distended; soft, non-tender, no rebound, no guarding, no  peritoneal signs, no hepatosplenomegaly BACK:  The back appears normal and is non-tender to palpation, there is no CVA tenderness EXT: Normal ROM in all joints; non-tender to palpation; no edema; normal capillary refill; no cyanosis, no calf tenderness or swelling    SKIN: Normal color for age and race; warm; no rash, no urticaria NEURO: Moves all extremities equally, sensation to light touch intact diffusely, cranial nerves II through XII intact, normal speech; voluntary shaking of both upper extremities and when she is asked a question and has to concentrate this shaking stop; normal gait PSYCH: The patient's mood and manner are appropriate. Grooming and personal hygiene are appropriate.  MEDICAL DECISION MAKING: Patient here with likely adverse reaction to Tamiflu. She feels shaky, anxious and unable to sleep. No focal neurologic deficits. She is otherwise very well-appearing, afebrile and nontoxic. No fever. Denies any pain currently. No signs of an allergic reaction. We'll give her IM Ativan to help with her symptoms but I feel she is safe to be discharged home with her husband. She has outpatient PCP follow-up as needed. Have advised her to discontinue Tamiflu. Recommended alternating Tylenol and Motrin as needed for fevers, chills, body aches for her viral illness. Nothing on exam right now to suggest recurrent pharyngitis, meningitis, pneumonia. She is comfortable with this plan.  At this time, I do not feel there is any life-threatening condition present. I have reviewed and discussed all results (EKG, imaging, lab, urine as appropriate) and exam findings with patient/family. I have reviewed nursing notes and appropriate previous records.  I feel the patient is safe to be discharged home without further emergent workup and can continue workup as an outpatient as needed. Discussed usual and customary return precautions. Patient/family verbalize understanding and are comfortable with this plan.   Outpatient follow-up has been provided. All questions have been answered.     [place], DO 11/27/16 [phone]

## 2016-11-27 NOTE — Discharge Instructions (Signed)
You were seen in the emergency department after adverse reaction to Tamiflu. This is an intolerance and not a true allergic reaction. We have given you a dose of Ativan to help with your anxiety, shakiness and insomnia. Please stop taking Tamiflu. I recommend you alternate Tylenol and Motrin as needed over-the-counter for fevers, bodyaches. Please rest and drink plenty of water. Please follow up with your primary care physician if symptoms worsen.

## 2016-12-08 ENCOUNTER — Emergency Department (HOSPITAL_COMMUNITY)
Admission: EM | Admit: 2016-12-08 | Discharge: 2016-12-08 | Disposition: A | Discharge status: Home / Self Care | Payer: BC Managed Care – PPO | Attending: Emergency Medicine | Admitting: Emergency Medicine

## 2016-12-08 ENCOUNTER — Encounter (HOSPITAL_COMMUNITY): Payer: Self-pay

## 2016-12-08 DIAGNOSIS — Z79899 Other long term (current) drug therapy: Secondary | ICD-10-CM | POA: Insufficient documentation

## 2016-12-08 DIAGNOSIS — R0989 Other specified symptoms and signs involving the circulatory and respiratory systems: Secondary | ICD-10-CM | POA: Diagnosis not present

## 2016-12-08 DIAGNOSIS — R509 Fever, unspecified: Secondary | ICD-10-CM | POA: Insufficient documentation

## 2016-12-08 DIAGNOSIS — R05 Cough: Secondary | ICD-10-CM | POA: Diagnosis not present

## 2016-12-08 DIAGNOSIS — R52 Pain, unspecified: Secondary | ICD-10-CM | POA: Insufficient documentation

## 2016-12-08 DIAGNOSIS — R6889 Other general symptoms and signs: Secondary | ICD-10-CM

## 2016-12-08 DIAGNOSIS — R0981 Nasal congestion: Secondary | ICD-10-CM | POA: Diagnosis not present

## 2016-12-08 NOTE — ED Triage Notes (Signed)
I have been treated for the flu.  I have been here twice and been to my doctor twice.  I am not getting any better.  I am still coughing, running a fever, and having chills.  I have been sick for 2 weeks per pt.  Was diagnosed with the flu, but never actually had a flu test.  Was given prescriptions and I had them filled and have been taking them.  I am nauseated, the medication that I am taking made me have some diarrhea.

## 2016-12-10 NOTE — ED Provider Notes (Signed)
[facility] DEPT Provider Note   CSN: [id number] Arrival date & time: 12/08/16  1941     History   Chief Complaint Chief Complaint  Patient presents with  . Influenza    HPI Adna A Croyle is a 41 y.o. female.  HPI  41 y.o. Complaining of flu symptoms.  She reports symptoms of runny nose, congestion, cough, intermittent fever, body aches.  Diagnoses with flu and treated with tamiflu but reports reaction.  Seen at pmd x 2.  Given several rx and has taken all.   Past Medical History:  Diagnosis Date  . GERD (gastroesophageal reflux disease)   . Helicobacter pylori (H. pylori)    prevpac  . Hemorrhoids     Patient Active Problem List   Diagnosis Date Noted  . GERD 03/07/2010  . EPIGASTRIC PAIN 03/07/2010  . HELICOBACTER PYLORI INFECTION, HX OF 03/07/2010    Past Surgical History:  Procedure Laterality Date  . APPENDECTOMY     41 years old  . ESOPHAGOGASTRODUODENOSCOPY  09/14/2007   Dr. Hurley Cisco quadrant distal esophageal erosion consistant with  moderately severe erosive reflux esophagitits, o/w normal esophagus.,tiny antral erosions o/w normal stomach, inflammation on bx  . ESOPHAGOGASTRODUODENOSCOPY (EGD) WITH ESOPHAGEAL DILATION  12/09/2012   Dr. Gala Romney- normal esophagus s/p maloney dilation, small hiatal hernia    OB History    Gravida Para Term Preterm AB Living             0   SAB TAB Ectopic Multiple Live Births                   Home Medications    Prior to Admission medications   Medication Sig Start Date End Date Taking? Authorizing Provider  benzonatate (TESSALON) 100 MG capsule Take 1 capsule (100 mg total) by mouth 3 (three) times daily as needed for cough. 11/23/16   Francine Graven, DO  cetirizine (ZYRTEC) 10 MG tablet Take 10 mg by mouth daily.    Historical Provider, MD  Multiple Vitamins-Minerals (MULTIVITAMIN ADULT PO) Take 1 tablet by mouth daily.    Historical Provider, MD  omeprazole (PRILOSEC) 20 MG capsule Take 20 mg by mouth 2  (two) times daily before a meal.    Historical Provider, MD  ondansetron (ZOFRAN ODT) 4 MG disintegrating tablet Take 1 tablet (4 mg total) by mouth every 8 (eight) hours as needed for nausea or vomiting. 11/23/16   Francine Graven, DO  oseltamivir (TAMIFLU) 75 MG capsule Take 1 capsule (75 mg total) by mouth every 12 (twelve) hours. 11/23/16   Francine Graven, DO    Family History No family history on file.  Social History Social History  Substance Use Topics  . Smoking status: Never Smoker  . Smokeless tobacco: Never Used  . Alcohol use No     Allergies   Tamiflu [oseltamivir phosphate] and Sulfonamide derivatives   Review of Systems Review of Systems  All other systems reviewed and are negative.    Physical Exam Updated Vital Signs BP 126/77 (BP Location: Left Arm)   Pulse 88   Temp 98 F (36.7 C) (Oral)   Resp 18   Ht 5\' 1"  (1.549 m)   Wt 59.9 kg   LMP 11/16/2016 (Exact Date)   SpO2 100%   BMI 24.94 kg/m   Physical Exam  Constitutional: She is oriented to person, place, and time. She appears well-developed and well-nourished. No distress.  HENT:  Head: Normocephalic and atraumatic.  Right Ear: External ear normal.  Left Ear:  External ear normal.  Nose: Nose normal.  Mouth/Throat: Oropharynx is clear and moist.  Eyes: Conjunctivae and EOM are normal. Pupils are equal, round, and reactive to light.  Neck: Normal range of motion. Neck supple.  Cardiovascular: Normal rate, regular rhythm, normal heart sounds and intact distal pulses.   Pulmonary/Chest: Effort normal and breath sounds normal. No respiratory distress. She has no wheezes.  Abdominal: Soft. Bowel sounds are normal.  Musculoskeletal: Normal range of motion.  Neurological: She is alert and oriented to person, place, and time. She exhibits normal muscle tone. Coordination normal.  Skin: Skin is warm and dry.  Psychiatric: She has a normal mood and affect. Her behavior is normal. Thought content  normal.  Nursing note and vitals reviewed.    ED Treatments / Results  Labs (all labs ordered are listed, but only abnormal results are displayed) Labs Reviewed - No data to display  EKG  EKG Interpretation None       Radiology No results found.  Procedures Procedures (including critical care time)  Medications Ordered in ED Medications - No data to display   Initial Impression / Assessment and Plan / ED Course  I have reviewed the triage vital signs and the nursing notes.  Pertinent labs & imaging results that were available during my care of the patient were reviewed by me and considered in my medical decision making (see chart for details).       Final Clinical Impressions(s) / ED Diagnoses   Final diagnoses:  Flu-like symptoms    New Prescriptions Discharge Medication List as of 12/08/2016  8:46 PM       Pattricia Boss, MD 12/10/16 1457

## 2017-05-19 ENCOUNTER — Ambulatory Visit (HOSPITAL_COMMUNITY)
Admission: RE | Admit: 2017-05-19 | Discharge: 2017-05-19 | Disposition: A | Discharge status: Home / Self Care | Payer: BC Managed Care – PPO | Source: Ambulatory Visit | Attending: Family Medicine | Admitting: Family Medicine

## 2017-05-19 ENCOUNTER — Other Ambulatory Visit (HOSPITAL_COMMUNITY): Payer: Self-pay | Admitting: Family Medicine

## 2017-05-19 DIAGNOSIS — N852 Hypertrophy of uterus: Secondary | ICD-10-CM | POA: Diagnosis not present

## 2017-05-19 DIAGNOSIS — R1031 Right lower quadrant pain: Secondary | ICD-10-CM | POA: Diagnosis not present

## 2017-05-21 ENCOUNTER — Encounter: Payer: Self-pay | Admitting: Obstetrics & Gynecology

## 2017-05-21 ENCOUNTER — Ambulatory Visit (INDEPENDENT_AMBULATORY_CARE_PROVIDER_SITE_OTHER): Payer: BC Managed Care – PPO | Admitting: Obstetrics & Gynecology

## 2017-05-21 ENCOUNTER — Encounter: Payer: Self-pay | Admitting: *Deleted

## 2017-05-21 VITALS — BP 106/60 | HR 80 | Wt 156.6 lb

## 2017-05-21 DIAGNOSIS — D259 Leiomyoma of uterus, unspecified: Secondary | ICD-10-CM

## 2017-05-21 NOTE — Progress Notes (Signed)
Chief Complaint  Patient presents with  . new gyn- work -in    fibroids    Blood pressure 106/60, pulse 80, weight 156 lb 9.6 oz (71 kg), last menstrual period 05/04/2017.  41 y.o. G0P0 Patient's last menstrual period was 05/04/2017. The current method of family planning is none.  Outpatient Encounter Prescriptions as of 05/21/2017  Medication Sig  . cetirizine (ZYRTEC) 10 MG tablet Take 10 mg by mouth daily.  . Multiple Vitamins-Minerals (MULTIVITAMIN ADULT PO) Take 1 tablet by mouth daily.  Marland Kitchen omeprazole (PRILOSEC) 20 MG capsule Take 20 mg by mouth 2 (two) times daily before a meal.  . triamcinolone cream (KENALOG) 0.1 % Apply 1 application topically 2 (two) times daily.  Marland Kitchen albuterol (PROVENTIL HFA;VENTOLIN HFA) 108 (90 Base) MCG/ACT inhaler Inhale into the lungs every 6 (six) hours as needed for wheezing or shortness of breath.  . Alum Hydroxide-Mag Carbonate (GAVISCON EXTRA STRENGTH) [id number] MG/10ML SUSP Take by mouth as needed.  . ferrous sulfate 325 (65 FE) MG tablet Take 325 mg by mouth 2 (two) times daily with a meal.  . [DISCONTINUED] benzonatate (TESSALON) 100 MG capsule Take 1 capsule (100 mg total) by mouth 3 (three) times daily as needed for cough.  . [DISCONTINUED] ondansetron (ZOFRAN ODT) 4 MG disintegrating tablet Take 1 tablet (4 mg total) by mouth every 8 (eight) hours as needed for nausea or vomiting.  . [DISCONTINUED] oseltamivir (TAMIFLU) 75 MG capsule Take 1 capsule (75 mg total) by mouth every 12 (twelve) hours.   No facility-administered encounter medications on file as of 05/21/2017.     Subjective Patient is seen today as a work in from Dr. Vickey Sages office The patient had been in her usual state of good health until 6 days ago when she began to have right lower quadrant pain She has had bladder infections in the past and thought that that's what was going on again She had some extra antibiotic and hand and decided take it but her pain has worsened and  she did not improve She went in to see Dr. 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 causing dramatic uptake in her pain  Objective Gen. well-developed well-nourished female in mild distress cause of pain in the right lower quadrant On examination her uterus is well above the umbilicus 75-10 week size and multiple fibroids are palpated She has normal external genitalia vagina is pink moist without discharge The cervix is displaced anteriorly into the right and can be palpated and feels normal but it didn't do not think a Pap smear would be possible The uterus is dramatically enlarged per the abdominal exam and there are multiple fibroids present especially one posteriorly  Pertinent ROS No burning with urination, frequency or urgency No nausea, vomiting or diarrhea Nor fever chills or other constitutional symptoms   Labs or studies No results found.    Impression Diagnoses this Encounter::   ICD-10-CM   1. Uterine leiomyoma, unspecified location D25.9    24-26 weeks size uterus, multiple fibroids, degenerating    Established relevant diagnosis(es):   Plan/Recommendations: No orders of the defined types were placed in this encounter.   Labs or Scans Ordered: No orders of the defined types were placed in this encounter.  Management:: Certainly this is not an acute uterine growth because there are multiple fibroids and is not consistent with a leiomyosarcoma picture However I do believe that the fibroids have evidently out strip to the blood supply of the fibroids causing acute pain which I would term degenerating Sonogram confirms this The patient is nulliparous and states that she is pretty much given up in pregnancy We  discussed options in general regarding management of the fibroids including myomectomy versus supracervical hysterectomy We discussed the pluses and minuses of this including excessive blood loss need for transfusion much more likely with a myomectomy but can certainly happen also with hysterectomy Tentatively we have scheduled the patient for surgery and July 25 but she is can decide between myomectomy and hysterectomy It is too big for her laparoscopic approach and she will need an abdominal supracervical hysterectomy  Follow up Return if symptoms worsen or fail to improve.  Plan surgical management July 25 patient to get back with me regarding myomectomy versus hysterectomy        Face to face time:  30 minutes  Greater than 50% of the visit time was spent in counseling and coordination of care with the patient.  The summary and outline of the counseling and care coordination is summarized in the note above.   All questions were answered.  Past Medical History:  Diagnosis Date  . GERD (gastroesophageal reflux disease)   . Helicobacter pylori (H. pylori)    prevpac  . Hemorrhoids     Past Surgical History:  Procedure Laterality Date  . APPENDECTOMY     41 years old  . ESOPHAGOGASTRODUODENOSCOPY  09/14/2007   Dr. Hurley Cisco quadrant distal esophageal erosion consistant with  moderately severe erosive reflux esophagitits, o/w normal esophagus.,tiny antral erosions o/w normal stomach, inflammation on bx  . ESOPHAGOGASTRODUODENOSCOPY (EGD) WITH ESOPHAGEAL DILATION  12/09/2012   Dr. Gala Romney- normal esophagus s/p maloney dilation, small hiatal hernia    OB History    Gravida Para Term Preterm AB Living             0   SAB TAB Ectopic Multiple Live Births                  Allergies  Allergen Reactions  . Tamiflu [Oseltamivir Phosphate] Other (See Comments)    Anxiety, insomnia, shaky, vomiting  . Levofloxacin   . Sulfonamide Derivatives Hives    Social History   Social  History  . Marital status: Single    Spouse name: N/A  . Number of children: N/A  . Years of education: N/A   Social History Main Topics  . Smoking status: Never Smoker  . Smokeless tobacco: Never Used  . Alcohol use No  . Drug use: No  . Sexual activity: No   Other Topics Concern  . None   Social History Narrative  . None    History reviewed. No pertinent family history.

## 2017-05-22 ENCOUNTER — Telehealth: Payer: Self-pay | Admitting: Obstetrics & Gynecology

## 2017-05-22 NOTE — Telephone Encounter (Signed)
Per our verbal conversation, noted

## 2017-05-27 NOTE — Patient Instructions (Signed)
[name]  05/27/2017     @PREFPERIOPPHARMACY @   Your procedure is scheduled on  06/03/2017   Report to [facility] at  615  A.M.  Call this number if you have problems the morning of surgery:  [phone]   Remember:  Do not eat food or drink liquids after midnight.  Take these medicines the morning of surgery with A SIP OF WATER  Zyrtec, prilosec. Use your inhaler before you come and bring your rescue inhaler with you if you have one.   Do not wear jewelry, make-up or nail polish.  Do not wear lotions, powders, or perfumes, or deoderant.  Do not shave 48 hours prior to surgery.  Men may shave face and neck.  Do not bring valuables to the hospital.  [facility] is not responsible for any belongings or valuables.  Contacts, dentures or bridgework may not be worn into surgery.  Leave your suitcase in the car.  After surgery it may be brought to your room.  For patients admitted to the hospital, discharge time will be determined by your treatment team.  Patients discharged the day of surgery will not be allowed to drive home.   Name and phone number of your driver:   family Special instructions:  None  Please read over the following fact sheets that you were given. Pain Booklet, Coughing and Deep Breathing, Blood Transfusion Information, Lab Information, Surgical Site Infection Prevention, Anesthesia Post-op Instructions and Care and Recovery After Surgery      Supracervical Hysterectomy A supracervical hysterectomy is surgery to remove the top part of the uterus, but not the cervix. You will no longer have menstrual periods or be able to get pregnant after this surgery. The fallopian tubes and ovaries may also be removed (bilateral salpingo-oophorectomy) during this surgery. This surgery is usually performed using a minimally invasive technique called laparoscopy. This technique allows the surgery to be done through small incisions. The minimally  invasive technique provides benefits such as less pain, less risk of infection, and shorter recovery time. Tell a health care provider about:  Any allergies you have.  All medicines you are taking, including vitamins, herbs, eye drops, creams, and over-the-counter medicines.  Any problems you or family members have had with anesthetic medicines.  Any blood disorders you have.  Any surgeries you have had.  Any medical conditions you have. What are the risks? Generally, this is a safe procedure. However, as with any procedure, complications can occur. Possible complications include:  Bleeding.  Blood clots in the legs or lung.  Infection.  Injury to surrounding organs.  Problems related to anesthesia.  Conversion to an open abdominal surgery.  Additional surgery later to remove the cervix if you have problems with the cervix.  What happens before the procedure?  Ask your health care provider about changing or stopping your regular medicines.  Do not take aspirin or blood thinners (anticoagulants) for 1 week before the surgery, or as directed by your health care provider.  Do not eat or drink anything for 8 hours before the surgery, or as directed by your health care provider.  Quit smoking if you smoke.  Arrange for a ride home after surgery and for someone to help you at home during recovery. What happens during the procedure?  You will be given an antibiotic medicine.  An IV tube will be placed in one of your veins. You will be given medicine to  make you sleep (general anesthetic).  A gas (carbon dioxide) will be used to inflate your abdomen. This will allow your surgeon to look inside your abdomen, perform your surgery, and treat any other problems found if necessary.  Three or four small incisions will be made in your abdomen. One of these incisions will be made in the area of your belly button (navel). A thin, flexible tube with a tiny camera and light on the end  of it (laparoscope) will be inserted into the incision. The camera on the laparoscope sends a picture to a TV screen in the operating room. This gives your surgeon a good view inside the abdomen.  Other surgical instruments will be inserted through the other incisions.  The uterus will be cut into small pieces and removed through the small incisions.  Your incisions will be closed. What happens after the procedure?  You will be taken to a recovery area where your progress will be monitored until you are awake, stable, and taking fluids well. If there are no other problems, you will then be moved to a regular hospital room, or you will be allowed to go home.  You will likely have minimal discomfort after the surgery because the incisions are so small with the laparoscopic technique.  You will be given pain medicine while you are in the hospital and for when you go home.  If a bilateral salpingo-oophorectomy was performed before menopause, you will go through a sudden (abrupt) menopause. This can be helped with hormone medicines. This information is not intended to replace advice given to you by your health care provider. Make sure you discuss any questions you have with your health care provider. Document Released: 04/14/2008 Document Revised: 04/03/2016 Document Reviewed: 04/29/2013 Elsevier Interactive Patient Education  2018 [place] Hysterectomy, Care After Refer to this sheet in the next few weeks. These instructions provide you with information on caring for yourself after your procedure. Your health care provider may also give you more specific instructions. Your treatment has been planned according to current medical practices, but problems sometimes occur. Call your health care provider if you have any problems or questions after your procedure. What can I expect after the procedure? After your procedure, it is typical to have some discomfort, tenderness, swelling,  and bruising at the surgical sites. This normally lasts for about 2 weeks. Follow these instructions at home:  Get plenty of rest and sleep.  Only take over-the-counter or prescription medicines as directed by your health care provider.  Do not take aspirin. It can cause bleeding.  Do not drive until your health care provider approves.  Follow your health care provider's advice regarding exercise, lifting, and general activities.  Resume your usual diet as directed by your health care provider.  Do not douche, use tampons, or have sexual intercourse for at least 6 weeks or until your health care provider gives you permission.  Change your bandages (dressings) only as directed by your health care provider.  Monitor your temperature.  Take showers instead of baths for 2-3 weeks or as directed by your health care provider.  Drink enough fluids to keep your urine clear or pale yellow.  Do not drink alcohol until your health care provider gives you permission.  If you are constipated, you may take a mild laxative if your health care provider approves. Bran foods may also help with constipation problems.  Try to have someone home with you for 1-2 weeks to help with activities.  Follow up with your health care provider as directed. Contact a health care provider if:  You have swelling, redness, or increasing pain in the incision area.  You have pus coming from an incision.  You notice a bad smell coming from the incision or dressing.  You have swelling, redness, or pain in the area around the IV site.  Your incision breaks open.  You feel dizzy or lightheaded.  You have pain or bleeding when you urinate.  You have persistent diarrhea.  You have persistent nausea and vomiting.  You have abnormal vaginal discharge.  You have a rash.  Your pain is not controlled with your prescribed medicine. Get help right away if:  You have a fever.  You have severe abdominal  pain.  You have chest pain.  You have shortness of breath.  You faint.  You have pain, swelling, or redness in your leg.  You have heavy vaginal bleeding with blood clots. This information is not intended to replace advice given to you by your health care provider. Make sure you discuss any questions you have with your health care provider. Document Released: 08/17/2013 Document Revised: 04/03/2016 Document Reviewed: 04/29/2013 Elsevier Interactive Patient Education  2018 [place] Anesthesia, Adult General anesthesia is the use of medicines to make a person "go to sleep" (be unconscious) for a medical procedure. General anesthesia is often recommended when a procedure:  Is long.  Requires you to be still or in an unusual position.  Is major and can cause you to lose blood.  Is impossible to do without general anesthesia.  The medicines used for general anesthesia are called general anesthetics. In addition to making you sleep, the medicines:  Prevent pain.  Control your blood pressure.  Relax your muscles.  Tell a health care provider about:  Any allergies you have.  All medicines you are taking, including vitamins, herbs, eye drops, creams, and over-the-counter medicines.  Any problems you or family members have had with anesthetic medicines.  Types of anesthetics you have had in the past.  Any bleeding disorders you have.  Any surgeries you have had.  Any medical conditions you have.  Any history of heart or lung conditions, such as heart failure, sleep apnea, or chronic obstructive pulmonary disease (COPD).  Whether you are pregnant or may be pregnant.  Whether you use tobacco, alcohol, marijuana, or street drugs.  Any history of Armed forces logistics/support/administrative officer.  Any history of depression or anxiety. What are the risks? Generally, this is a safe procedure. However, problems may occur, including:  Allergic reaction to anesthetics.  Lung and heart  problems.  Inhaling food or liquids from your stomach into your lungs (aspiration).  Injury to nerves.  Waking up during your procedure and being unable to move (rare).  Extreme agitation or a state of mental confusion (delirium) when you wake up from the anesthetic.  Air in the bloodstream, which can lead to stroke.  These problems are more likely to develop if you are having a major surgery or if you have an advanced medical condition. You can prevent some of these complications by answering all of your health care provider's questions thoroughly and by following all pre-procedure instructions. General anesthesia can cause side effects, including:  Nausea or vomiting  A sore throat from the breathing tube.  Feeling cold or shivery.  Feeling tired, washed out, or achy.  Sleepiness or drowsiness.  Confusion or agitation.  What happens before the procedure? Staying hydrated Follow instructions  from your health care provider about hydration, which may include:  Up to 2 hours before the procedure - you may continue to drink clear liquids, such as water, clear fruit juice, black coffee, and plain tea.  Eating and drinking restrictions Follow instructions from your health care provider about eating and drinking, which may include:  8 hours before the procedure - stop eating heavy meals or foods such as meat, fried foods, or fatty foods.  6 hours before the procedure - stop eating light meals or foods, such as toast or cereal.  6 hours before the procedure - stop drinking milk or drinks that contain milk.  2 hours before the procedure - stop drinking clear liquids.  Medicines  Ask your health care provider about: ? Changing or stopping your regular medicines. This is especially important if you are taking diabetes medicines or blood thinners. ? Taking medicines such as aspirin and ibuprofen. These medicines can thin your blood. Do not take these medicines before your  procedure if your health care provider instructs you not to. ? Taking new dietary supplements or medicines. Do not take these during the week before your procedure unless your health care provider approves them.  If you are told to take a medicine or to continue taking a medicine on the day of the procedure, take the medicine with sips of water. General instructions   Ask if you will be going home the same day, the following day, or after a longer hospital stay. ? Plan to have someone take you home. ? Plan to have someone stay with you for the first 24 hours after you leave the hospital or clinic.  For 3-6 weeks before the procedure, try not to use any tobacco products, such as cigarettes, chewing tobacco, and e-cigarettes.  You may brush your teeth on the morning of the procedure, but make sure to spit out the toothpaste. What happens during the procedure?  You will be given anesthetics through a mask and through an IV tube in one of your veins.  You may receive medicine to help you relax (sedative).  As soon as you are asleep, a breathing tube may be used to help you breathe.  An anesthesia specialist will stay with you throughout the procedure. He or she will help keep you comfortable and safe by continuing to give you medicines and adjusting the amount of medicine that you get. He or she will also watch your blood pressure, pulse, and oxygen levels to make sure that the anesthetics do not cause any problems.  If a breathing tube was used to help you breathe, it will be removed before you wake up. The procedure may vary among health care providers and hospitals. What happens after the procedure?  You will wake up, often slowly, after the procedure is complete, usually in a recovery area.  Your blood pressure, heart rate, breathing rate, and blood oxygen level will be monitored until the medicines you were given have worn off.  You may be given medicine to help you calm down if you  feel anxious or agitated.  If you will be going home the same day, your health care provider may check to make sure you can stand, drink, and urinate.  Your health care providers will treat your pain and side effects before you go home.  Do not drive for 24 hours if you received a sedative.  You may: ? Feel nauseous and vomit. ? Have a sore throat. ? Have mental slowness. ?  Feel cold or shivery. ? Feel sleepy. ? Feel tired. ? Feel sore or achy, even in parts of your body where you did not have surgery. This information is not intended to replace advice given to you by your health care provider. Make sure you discuss any questions you have with your health care provider. Document Released: 02/03/2008 Document Revised: 04/08/2016 Document Reviewed: 10/11/2015 Elsevier Interactive Patient Education  2018 [place] Anesthesia, Adult, Care After These instructions provide you with information about caring for yourself after your procedure. Your health care provider may also give you more specific instructions. Your treatment has been planned according to current medical practices, but problems sometimes occur. Call your health care provider if you have any problems or questions after your procedure. What can I expect after the procedure? After the procedure, it is common to have:  Vomiting.  A sore throat.  Mental slowness.  It is common to feel:  Nauseous.  Cold or shivery.  Sleepy.  Tired.  Sore or achy, even in parts of your body where you did not have surgery.  Follow these instructions at home: For at least 24 hours after the procedure:  Do not: ? Participate in activities where you could fall or become injured. ? Drive. ? Use heavy machinery. ? Drink alcohol. ? Take sleeping pills or medicines that cause drowsiness. ? Make important decisions or sign legal documents. ? Take care of children on your own.  Rest. Eating and drinking  If you vomit,  drink water, juice, or soup when you can drink without vomiting.  Drink enough fluid to keep your urine clear or pale yellow.  Make sure you have little or no nausea before eating solid foods.  Follow the diet recommended by your health care provider. General instructions  Have a responsible adult stay with you until you are awake and alert.  Return to your normal activities as told by your health care provider. Ask your health care provider what activities are safe for you.  Take over-the-counter and prescription medicines only as told by your health care provider.  If you smoke, do not smoke without supervision.  Keep all follow-up visits as told by your health care provider. This is important. Contact a health care provider if:  You continue to have nausea or vomiting at home, and medicines are not helpful.  You cannot drink fluids or start eating again.  You cannot urinate after 8-12 hours.  You develop a skin rash.  You have fever.  You have increasing redness at the site of your procedure. Get help right away if:  You have difficulty breathing.  You have chest pain.  You have unexpected bleeding.  You feel that you are having a life-threatening or urgent problem. This information is not intended to replace advice given to you by your health care provider. Make sure you discuss any questions you have with your health care provider. Document Released: 02/02/2001 Document Revised: 03/31/2016 Document Reviewed: 10/11/2015 Elsevier Interactive Patient Education  [employer].

## 2017-05-28 ENCOUNTER — Other Ambulatory Visit: Payer: Self-pay | Admitting: Obstetrics & Gynecology

## 2017-05-29 ENCOUNTER — Encounter (HOSPITAL_COMMUNITY)
Admission: RE | Admit: 2017-05-29 | Discharge: 2017-05-29 | Disposition: A | Discharge status: Home / Self Care | Payer: BC Managed Care – PPO | Source: Ambulatory Visit | Attending: Obstetrics & Gynecology | Admitting: Obstetrics & Gynecology

## 2017-05-29 ENCOUNTER — Encounter (HOSPITAL_COMMUNITY): Payer: Self-pay

## 2017-05-29 DIAGNOSIS — Z01812 Encounter for preprocedural laboratory examination: Secondary | ICD-10-CM | POA: Diagnosis present

## 2017-05-29 HISTORY — DX: Anemia, unspecified: D64.9

## 2017-05-29 LAB — COMPREHENSIVE METABOLIC PANEL
ALK PHOS: 76 U/L (ref 38–126)
ALT: 11 U/L — AB (ref 14–54)
AST: 19 U/L (ref 15–41)
Albumin: 4 g/dL (ref 3.5–5.0)
Anion gap: 8 (ref 5–15)
BUN: 7 mg/dL (ref 6–20)
CALCIUM: 9.3 mg/dL (ref 8.9–10.3)
CO2: 24 mmol/L (ref 22–32)
CREATININE: 0.78 mg/dL (ref 0.44–1.00)
Chloride: 106 mmol/L (ref 101–111)
GFR calc Af Amer: >60 mL/min (ref >60)
GFR calc non Af Amer: >60 mL/min (ref >60)
GLUCOSE: 86 mg/dL (ref 65–99)
Potassium: 3.4 mmol/L — ABNORMAL LOW (ref 3.5–5.1)
SODIUM: 138 mmol/L (ref 135–145)
Total Bilirubin: 0.5 mg/dL (ref 0.3–1.2)
Total Protein: 7.5 g/dL (ref 6.5–8.1)

## 2017-05-29 LAB — CBC
HCT: 37.4 % (ref 36.0–46.0)
HEMOGLOBIN: 12.5 g/dL (ref 12.0–15.0)
MCH: 28.9 pg (ref 26.0–34.0)
MCHC: 33.4 g/dL (ref 30.0–36.0)
MCV: 86.4 fL (ref 78.0–100.0)
Platelets: 326 10*3/uL (ref 150–400)
RBC: 4.33 MIL/uL (ref 3.87–5.11)
RDW: 15.5 % (ref 11.5–15.5)
WBC: 6.4 10*3/uL (ref 4.0–10.5)

## 2017-05-29 LAB — RAPID HIV SCREEN (HIV 1/2 AB+AG)
HIV 1/2 Antibodies: NONREACTIVE
HIV-1 P24 Antigen - HIV24: NONREACTIVE

## 2017-05-29 LAB — SURGICAL PCR SCREEN
MRSA, PCR: NEGATIVE
Staphylococcus aureus: NEGATIVE

## 2017-05-29 LAB — HCG, QUANTITATIVE, PREGNANCY: hCG, Beta Chain, Quant, S: 1 m[IU]/mL (ref <5)

## 2017-05-30 LAB — URINALYSIS, ROUTINE W REFLEX MICROSCOPIC
BILIRUBIN URINE: NEGATIVE
Glucose, UA: NEGATIVE mg/dL
Ketones, ur: NEGATIVE mg/dL
NITRITE: NEGATIVE
PH: 6 (ref 5.0–8.0)
Protein, ur: NEGATIVE mg/dL
SPECIFIC GRAVITY, URINE: 1.003 — AB (ref 1.005–1.030)

## 2017-06-03 ENCOUNTER — Encounter (HOSPITAL_COMMUNITY): Payer: Self-pay | Admitting: *Deleted

## 2017-06-03 ENCOUNTER — Inpatient Hospital Stay (HOSPITAL_COMMUNITY): Payer: BC Managed Care – PPO | Admitting: Anesthesiology

## 2017-06-03 ENCOUNTER — Encounter (HOSPITAL_COMMUNITY)
Admission: RE | Disposition: A | Discharge status: Home / Self Care | Payer: Self-pay | Source: Ambulatory Visit | Attending: Obstetrics & Gynecology

## 2017-06-03 ENCOUNTER — Inpatient Hospital Stay (HOSPITAL_COMMUNITY)
Admission: RE | Admit: 2017-06-03 | Discharge: 2017-06-04 | DRG: 743 | Disposition: A | Discharge status: Home / Self Care | Payer: BC Managed Care – PPO | Source: Ambulatory Visit | Attending: Obstetrics & Gynecology | Admitting: Obstetrics & Gynecology

## 2017-06-03 DIAGNOSIS — N838 Other noninflammatory disorders of ovary, fallopian tube and broad ligament: Secondary | ICD-10-CM | POA: Diagnosis not present

## 2017-06-03 DIAGNOSIS — D259 Leiomyoma of uterus, unspecified: Principal | ICD-10-CM | POA: Diagnosis present

## 2017-06-03 DIAGNOSIS — D250 Submucous leiomyoma of uterus: Secondary | ICD-10-CM | POA: Diagnosis not present

## 2017-06-03 DIAGNOSIS — Z90711 Acquired absence of uterus with remaining cervical stump: Secondary | ICD-10-CM | POA: Diagnosis present

## 2017-06-03 DIAGNOSIS — K219 Gastro-esophageal reflux disease without esophagitis: Secondary | ICD-10-CM | POA: Diagnosis present

## 2017-06-03 DIAGNOSIS — Z881 Allergy status to other antibiotic agents status: Secondary | ICD-10-CM

## 2017-06-03 DIAGNOSIS — Z888 Allergy status to other drugs, medicaments and biological substances status: Secondary | ICD-10-CM | POA: Diagnosis not present

## 2017-06-03 DIAGNOSIS — Z882 Allergy status to sulfonamides status: Secondary | ICD-10-CM

## 2017-06-03 DIAGNOSIS — R1031 Right lower quadrant pain: Secondary | ICD-10-CM | POA: Diagnosis present

## 2017-06-03 DIAGNOSIS — N7011 Chronic salpingitis: Secondary | ICD-10-CM | POA: Diagnosis present

## 2017-06-03 DIAGNOSIS — N8311 Corpus luteum cyst of right ovary: Secondary | ICD-10-CM | POA: Diagnosis not present

## 2017-06-03 HISTORY — PX: UNILATERAL SALPINGECTOMY: SHX6160

## 2017-06-03 HISTORY — PX: SALPINGOOPHORECTOMY: SHX82

## 2017-06-03 HISTORY — PX: SUPRACERVICAL ABDOMINAL HYSTERECTOMY: SHX5393

## 2017-06-03 LAB — TYPE AND SCREEN
ABO/RH(D): B POS
ANTIBODY SCREEN: NEGATIVE

## 2017-06-03 LAB — HEMOGLOBIN AND HEMATOCRIT, BLOOD
HEMATOCRIT: 25.4 % — AB (ref 36.0–46.0)
HEMOGLOBIN: 8.8 g/dL — AB (ref 12.0–15.0)

## 2017-06-03 SURGERY — HYSTERECTOMY, SUPRACERVICAL, ABDOMINAL
Anesthesia: General | Site: Abdomen | Laterality: Right

## 2017-06-03 MED ORDER — ALBUTEROL SULFATE HFA 108 (90 BASE) MCG/ACT IN AERS: 1.0000 | INHALATION_SPRAY | Freq: Four times a day (QID) | RESPIRATORY_TRACT | Status: DC | PRN

## 2017-06-03 MED ORDER — GLYCOPYRROLATE 0.2 MG/ML IJ SOLN
INTRAMUSCULAR | Status: AC
Start: 2017-06-03 — End: 2017-06-03
  Filled 2017-06-03: qty 3

## 2017-06-03 MED ORDER — LIDOCAINE HCL (PF) 1 % IJ SOLN
INTRAMUSCULAR | Status: AC
  Filled 2017-06-03: qty 5

## 2017-06-03 MED ORDER — BUPIVACAINE LIPOSOME 1.3 % IJ SUSP
INTRAMUSCULAR | Status: AC
  Filled 2017-06-03: qty 20

## 2017-06-03 MED ORDER — MIDAZOLAM HCL 5 MG/5ML IJ SOLN
INTRAMUSCULAR | Status: DC | PRN
  Administered 2017-06-03: 2 mg via INTRAVENOUS

## 2017-06-03 MED ORDER — PROPOFOL 10 MG/ML IV BOLUS
INTRAVENOUS | Status: AC
  Filled 2017-06-03: qty 40

## 2017-06-03 MED ORDER — MIDAZOLAM HCL 2 MG/2ML IJ SOLN
1.0000 mg | INTRAMUSCULAR | Status: DC
Start: 2017-06-03 — End: 2017-06-03
  Administered 2017-06-03: 2 mg via INTRAVENOUS

## 2017-06-03 MED ORDER — CEFAZOLIN SODIUM-DEXTROSE 2-4 GM/100ML-% IV SOLN
2.0000 g | INTRAVENOUS | Status: AC
  Administered 2017-06-03: 2 g via INTRAVENOUS

## 2017-06-03 MED ORDER — LORAZEPAM 2 MG/ML IJ SOLN
INTRAMUSCULAR | Status: AC
  Filled 2017-06-03: qty 1

## 2017-06-03 MED ORDER — SIMETHICONE 80 MG PO CHEW: 80.0000 mg | CHEWABLE_TABLET | Freq: Four times a day (QID) | ORAL | Status: DC | PRN

## 2017-06-03 MED ORDER — BISACODYL 10 MG RE SUPP: 10.0000 mg | Freq: Every day | RECTAL | Status: DC | PRN

## 2017-06-03 MED ORDER — 0.9 % SODIUM CHLORIDE (POUR BTL) OPTIME
TOPICAL | Status: DC | PRN
  Administered 2017-06-03: 3000 mL

## 2017-06-03 MED ORDER — HYDROMORPHONE HCL 1 MG/ML IJ SOLN
1.0000 mg | INTRAMUSCULAR | Status: DC | PRN
  Administered 2017-06-03 (×3): 2 mg via INTRAVENOUS
  Administered 2017-06-04 (×2): 1 mg via INTRAVENOUS
  Filled 2017-06-03 (×2): qty 2
  Filled 2017-06-03: qty 1
  Filled 2017-06-03: qty 2
  Filled 2017-06-03: qty 1

## 2017-06-03 MED ORDER — BUPIVACAINE LIPOSOME 1.3 % IJ SUSP
INTRAMUSCULAR | Status: DC | PRN
  Administered 2017-06-03: 20 mL

## 2017-06-03 MED ORDER — ONDANSETRON HCL 4 MG/2ML IJ SOLN
4.0000 mg | Freq: Once | INTRAMUSCULAR | Status: AC
  Administered 2017-06-03: 4 mg via INTRAVENOUS

## 2017-06-03 MED ORDER — HYDROMORPHONE HCL 1 MG/ML IJ SOLN
0.2500 mg | INTRAMUSCULAR | Status: DC | PRN
  Administered 2017-06-03 (×6): 0.5 mg via INTRAVENOUS

## 2017-06-03 MED ORDER — SUCCINYLCHOLINE CHLORIDE 20 MG/ML IJ SOLN
INTRAMUSCULAR | Status: AC
Start: 2017-06-03 — End: 2017-06-03
  Filled 2017-06-03: qty 1

## 2017-06-03 MED ORDER — LIDOCAINE HCL 1 % IJ SOLN
INTRAMUSCULAR | Status: DC | PRN
  Administered 2017-06-03: 25 mg via INTRADERMAL

## 2017-06-03 MED ORDER — SENNOSIDES-DOCUSATE SODIUM 8.6-50 MG PO TABS: 1.0000 | ORAL_TABLET | Freq: Every evening | ORAL | Status: DC | PRN

## 2017-06-03 MED ORDER — ZOLPIDEM TARTRATE 5 MG PO TABS: 5.0000 mg | ORAL_TABLET | Freq: Every evening | ORAL | Status: DC | PRN

## 2017-06-03 MED ORDER — ONDANSETRON HCL 4 MG/2ML IJ SOLN
INTRAMUSCULAR | Status: AC
  Filled 2017-06-03: qty 2

## 2017-06-03 MED ORDER — SODIUM CHLORIDE 0.9 % IJ SOLN
INTRAMUSCULAR | Status: AC
  Filled 2017-06-03: qty 10

## 2017-06-03 MED ORDER — ONDANSETRON HCL 4 MG/2ML IJ SOLN
4.0000 mg | Freq: Once | INTRAMUSCULAR | Status: AC | PRN
  Administered 2017-06-03: 4 mg via INTRAVENOUS

## 2017-06-03 MED ORDER — EPHEDRINE SULFATE 50 MG/ML IJ SOLN
INTRAMUSCULAR | Status: AC
  Filled 2017-06-03: qty 1

## 2017-06-03 MED ORDER — LORAZEPAM 2 MG/ML IJ SOLN
1.0000 mg | Freq: Once | INTRAMUSCULAR | Status: AC
  Administered 2017-06-03: 1 mg via INTRAVENOUS

## 2017-06-03 MED ORDER — NEOSTIGMINE METHYLSULFATE 10 MG/10ML IV SOLN
INTRAVENOUS | Status: AC
Start: 2017-06-03 — End: 2017-06-03
  Filled 2017-06-03: qty 1

## 2017-06-03 MED ORDER — GLYCOPYRROLATE 0.2 MG/ML IJ SOLN
0.2000 mg | Freq: Once | INTRAMUSCULAR | Status: AC
  Administered 2017-06-03: 0.2 mg via INTRAVENOUS

## 2017-06-03 MED ORDER — CEFAZOLIN SODIUM-DEXTROSE 2-4 GM/100ML-% IV SOLN
INTRAVENOUS | Status: AC
  Filled 2017-06-03: qty 100

## 2017-06-03 MED ORDER — KCL IN DEXTROSE-NACL 20-5-0.45 MEQ/L-%-% IV SOLN
INTRAVENOUS | Status: DC
  Administered 2017-06-03 – 2017-06-04 (×3): via INTRAVENOUS

## 2017-06-03 MED ORDER — ALBUTEROL SULFATE (2.5 MG/3ML) 0.083% IN NEBU: 2.5000 mg | INHALATION_SOLUTION | Freq: Four times a day (QID) | RESPIRATORY_TRACT | Status: DC | PRN

## 2017-06-03 MED ORDER — ROCURONIUM BROMIDE 50 MG/5ML IV SOLN
INTRAVENOUS | Status: AC
  Filled 2017-06-03: qty 2

## 2017-06-03 MED ORDER — FENTANYL CITRATE (PF) 250 MCG/5ML IJ SOLN
INTRAMUSCULAR | Status: AC
  Filled 2017-06-03: qty 10

## 2017-06-03 MED ORDER — PROMETHAZINE HCL 25 MG/ML IJ SOLN
INTRAMUSCULAR | Status: AC
  Filled 2017-06-03: qty 1

## 2017-06-03 MED ORDER — GLYCOPYRROLATE 0.2 MG/ML IJ SOLN
INTRAMUSCULAR | Status: AC
Start: 2017-06-03 — End: 2017-06-03
  Filled 2017-06-03: qty 1

## 2017-06-03 MED ORDER — ONDANSETRON HCL 4 MG PO TABS: 8.0000 mg | ORAL_TABLET | Freq: Four times a day (QID) | ORAL | Status: DC | PRN

## 2017-06-03 MED ORDER — HYDROMORPHONE HCL 1 MG/ML IJ SOLN
0.2500 mg | INTRAMUSCULAR | Status: DC | PRN
  Filled 2017-06-03 (×2): qty 1

## 2017-06-03 MED ORDER — PROMETHAZINE HCL 25 MG/ML IJ SOLN
25.0000 mg | Freq: Four times a day (QID) | INTRAMUSCULAR | Status: DC | PRN
  Administered 2017-06-03: 25 mg via INTRAVENOUS
  Filled 2017-06-03: qty 1

## 2017-06-03 MED ORDER — DOCUSATE SODIUM 100 MG PO CAPS
100.0000 mg | ORAL_CAPSULE | Freq: Two times a day (BID) | ORAL | Status: DC
  Administered 2017-06-03 – 2017-06-04 (×2): 100 mg via ORAL
  Filled 2017-06-03 (×2): qty 1

## 2017-06-03 MED ORDER — LORAZEPAM 2 MG/ML IJ SOLN
1.0000 mg | Freq: Four times a day (QID) | INTRAMUSCULAR | Status: DC | PRN
  Administered 2017-06-03 – 2017-06-04 (×3): 1 mg via INTRAVENOUS
  Filled 2017-06-03 (×3): qty 1

## 2017-06-03 MED ORDER — MIDAZOLAM HCL 2 MG/2ML IJ SOLN
INTRAMUSCULAR | Status: AC
  Filled 2017-06-03: qty 2

## 2017-06-03 MED ORDER — ONDANSETRON HCL 40 MG/20ML IJ SOLN
8.0000 mg | Freq: Four times a day (QID) | INTRAMUSCULAR | Status: DC | PRN
  Administered 2017-06-03: 8 mg via INTRAVENOUS
  Filled 2017-06-03 (×2): qty 4

## 2017-06-03 MED ORDER — OXYCODONE-ACETAMINOPHEN 5-325 MG PO TABS
1.0000 | ORAL_TABLET | ORAL | Status: DC | PRN
  Administered 2017-06-03 – 2017-06-04 (×3): 2 via ORAL
  Filled 2017-06-03 (×4): qty 2

## 2017-06-03 MED ORDER — BUPIVACAINE LIPOSOME 1.3 % IJ SUSP
20.0000 mL | Freq: Once | INTRAMUSCULAR | Status: DC
  Filled 2017-06-03: qty 20

## 2017-06-03 MED ORDER — HEMOSTATIC AGENTS (NO CHARGE) OPTIME
TOPICAL | Status: DC | PRN
  Administered 2017-06-03 (×2): 1 via TOPICAL

## 2017-06-03 MED ORDER — SODIUM CHLORIDE 0.9% FLUSH
INTRAVENOUS | Status: AC
  Filled 2017-06-03: qty 10

## 2017-06-03 MED ORDER — KETOROLAC TROMETHAMINE 30 MG/ML IJ SOLN
INTRAMUSCULAR | Status: AC
  Filled 2017-06-03: qty 1

## 2017-06-03 MED ORDER — PROPOFOL 10 MG/ML IV BOLUS
INTRAVENOUS | Status: DC | PRN
  Administered 2017-06-03: 130 mg via INTRAVENOUS

## 2017-06-03 MED ORDER — PANTOPRAZOLE SODIUM 40 MG PO TBEC
40.0000 mg | DELAYED_RELEASE_TABLET | Freq: Every day | ORAL | Status: DC
  Administered 2017-06-04: 40 mg via ORAL
  Filled 2017-06-03: qty 1

## 2017-06-03 MED ORDER — LACTATED RINGERS IV SOLN
INTRAVENOUS | Status: DC
  Administered 2017-06-03: 1000 mL via INTRAVENOUS
  Administered 2017-06-03 (×3): via INTRAVENOUS

## 2017-06-03 MED ORDER — NEOSTIGMINE METHYLSULFATE 10 MG/10ML IV SOLN
INTRAVENOUS | Status: DC | PRN
  Administered 2017-06-03: 2 mg via INTRAVENOUS

## 2017-06-03 MED ORDER — ROCURONIUM BROMIDE 100 MG/10ML IV SOLN
INTRAVENOUS | Status: DC | PRN
  Administered 2017-06-03: 10 mg via INTRAVENOUS
  Administered 2017-06-03: 45 mg via INTRAVENOUS
  Administered 2017-06-03: 10 mg via INTRAVENOUS
  Administered 2017-06-03: 5 mg via INTRAVENOUS

## 2017-06-03 MED ORDER — HYDROMORPHONE HCL 1 MG/ML IJ SOLN
INTRAMUSCULAR | Status: AC
  Filled 2017-06-03: qty 1

## 2017-06-03 MED ORDER — KETOROLAC TROMETHAMINE 30 MG/ML IJ SOLN
30.0000 mg | Freq: Once | INTRAMUSCULAR | Status: AC
  Administered 2017-06-03: 30 mg via INTRAVENOUS

## 2017-06-03 MED ORDER — FENTANYL CITRATE (PF) 100 MCG/2ML IJ SOLN
INTRAMUSCULAR | Status: DC | PRN
  Administered 2017-06-03 (×5): 50 ug via INTRAVENOUS

## 2017-06-03 MED ORDER — GLYCOPYRROLATE 0.2 MG/ML IJ SOLN
INTRAMUSCULAR | Status: DC | PRN
  Administered 2017-06-03: 0.6 mg via INTRAVENOUS

## 2017-06-03 MED ORDER — PROMETHAZINE HCL 25 MG/ML IJ SOLN
6.2500 mg | INTRAMUSCULAR | Status: DC | PRN
  Administered 2017-06-03: 6.25 mg via INTRAVENOUS

## 2017-06-03 SURGICAL SUPPLY — 47 items
APPLIER CLIP 13 LRG OPEN (CLIP)
APR CLP LRG 13 20 CLIP (CLIP)
BAG HAMPER (MISCELLANEOUS) ×5 IMPLANT
BLADE SURG SZ10 CARB STEEL (BLADE) ×5 IMPLANT
CLIP APPLIE 13 LRG OPEN (CLIP) IMPLANT
CLOTH BEACON ORANGE TIMEOUT ST (SAFETY) ×5 IMPLANT
COVER LIGHT HANDLE STERIS (MISCELLANEOUS) ×10 IMPLANT
DERMABOND ADVANCED (GAUZE/BANDAGES/DRESSINGS) ×2
DERMABOND ADVANCED .7 DNX12 (GAUZE/BANDAGES/DRESSINGS) ×3 IMPLANT
DRAPE WARM FLUID 44X44 (DRAPE) ×5 IMPLANT
DRSG OPSITE POSTOP 4X8 (GAUZE/BANDAGES/DRESSINGS) ×5 IMPLANT
FORMALIN 10 PREFIL 480ML (MISCELLANEOUS) ×5 IMPLANT
GLOVE BIOGEL PI IND STRL 6.5 (GLOVE) ×9 IMPLANT
GLOVE BIOGEL PI IND STRL 7.0 (GLOVE) ×3 IMPLANT
GLOVE BIOGEL PI IND STRL 8 (GLOVE) ×3 IMPLANT
GLOVE BIOGEL PI INDICATOR 6.5 (GLOVE) ×6
GLOVE BIOGEL PI INDICATOR 7.0 (GLOVE) ×2
GLOVE BIOGEL PI INDICATOR 8 (GLOVE) ×2
GLOVE ECLIPSE 8.0 STRL XLNG CF (GLOVE) ×5 IMPLANT
GOWN STRL REUS W/ TWL LRG LVL3 (GOWN DISPOSABLE) ×6 IMPLANT
GOWN STRL REUS W/TWL LRG LVL3 (GOWN DISPOSABLE) ×4
GOWN STRL REUS W/TWL XL LVL3 (GOWN DISPOSABLE) ×5 IMPLANT
HEMOSTAT ARISTA ABSORB 3G PWDR (MISCELLANEOUS) ×10 IMPLANT
INST SET MAJOR GENERAL (KITS) ×5 IMPLANT
KIT ROOM TURNOVER APOR (KITS) ×5 IMPLANT
MANIFOLD NEPTUNE II (INSTRUMENTS) ×5 IMPLANT
NEEDLE HYPO 21X1.5 SAFETY (NEEDLE) ×5 IMPLANT
NS IRRIG 1000ML POUR BTL (IV SOLUTION) ×15 IMPLANT
PACK ABDOMINAL MAJOR (CUSTOM PROCEDURE TRAY) ×5 IMPLANT
PAD ARMBOARD 7.5X6 YLW CONV (MISCELLANEOUS) ×5 IMPLANT
SET BASIN LINEN APH (SET/KITS/TRAYS/PACK) ×5 IMPLANT
SPONGE LAP 18X18 X RAY DECT (DISPOSABLE) ×20 IMPLANT
SUT CHROMIC 0 CT 1 (SUTURE) ×5 IMPLANT
SUT MNCRL+ AB 3-0 CT1 36 (SUTURE) IMPLANT
SUT MON AB 3-0 SH 27 (SUTURE) IMPLANT
SUT MONOCRYL AB 3-0 CT1 36IN (SUTURE)
SUT PLAIN 2 0 XLH (SUTURE) IMPLANT
SUT VIC AB 0 CT1 27 (SUTURE) ×6
SUT VIC AB 0 CT1 27XBRD ANTBC (SUTURE) ×3 IMPLANT
SUT VIC AB 0 CT1 27XCR 8 STRN (SUTURE) ×6 IMPLANT
SUT VIC AB 0 CTX 36 (SUTURE) ×2
SUT VIC AB 0 CTX36XBRD ANTBCTR (SUTURE) ×3 IMPLANT
SUT VIC AB 4-0 KS 27 (SUTURE) ×5 IMPLANT
SUT VICRYL 3 0 (SUTURE) IMPLANT
SYR 20CC LL (SYRINGE) ×5 IMPLANT
TOWEL BLUE STERILE X RAY DET (MISCELLANEOUS) ×5 IMPLANT
TRAY FOLEY W/METER SILVER 16FR (SET/KITS/TRAYS/PACK) ×5 IMPLANT

## 2017-06-03 NOTE — Progress Notes (Signed)
Awake. Moaning/groaning. Perineum soiled. Perineum cleansed. Peri pad applied. No active vaginal drainage. Foley secured to right thigh. Positioned on left side per patient request.

## 2017-06-03 NOTE — Transfer of Care (Signed)
Immediate Anesthesia Transfer of Care Note  Patient: Oleva A Shiflet  Procedure(s) Performed: Procedure(s): HYSTERECTOMY SUPRACERVICAL ABDOMINAL (N/A) left salpingectomy (Left) SALPINGO OOPHORECTOMY (Right)  Patient Location: PACU  Anesthesia Type:General  Level of Consciousness: awake and patient cooperative  Airway & Oxygen Therapy: Patient Spontanous Breathing and Patient connected to face mask oxygen  Post-op Assessment: Report given to RN, Post -op Vital signs reviewed and stable and Patient moving all extremities  Post vital signs: Reviewed and stable  Last Vitals:  Vitals:   06/03/17 0720 06/03/17 0725  BP: (!) 142/94 (!) 142/94  Pulse:    Resp: 18 (!) 22  Temp:      Last Pain:  Vitals:   06/03/17 0656  TempSrc: Oral      Patients Stated Pain Goal: 10 (50/35/46 5681)  Complications: No apparent anesthesia complications

## 2017-06-03 NOTE — Anesthesia Preprocedure Evaluation (Signed)
Anesthesia Evaluation  Patient identified by MRN, date of birth, ID band Patient awake    Reviewed: Allergy & Precautions, NPO status , Patient's Chart, lab work & pertinent test results  Airway Mallampati: II  TM Distance: >3 FB Neck ROM: Full    Dental  (+) Teeth Intact   Pulmonary neg pulmonary ROS,    breath sounds clear to auscultation       Cardiovascular negative cardio ROS   Rhythm:Regular     Neuro/Psych negative neurological ROS  negative psych ROS   GI/Hepatic Neg liver ROS, GERD  ,  Endo/Other  negative endocrine ROS  Renal/GU negative Renal ROS     Musculoskeletal negative musculoskeletal ROS (+)   Abdominal   Peds  Hematology  (+) anemia ,   Anesthesia Other Findings   Reproductive/Obstetrics                             Anesthesia Physical Anesthesia Plan  ASA: II  Anesthesia Plan: General   Post-op Pain Management:    Induction: Intravenous, Rapid sequence and Cricoid pressure planned  PONV Risk Score and Plan:   Airway Management Planned: Oral ETT  Additional Equipment:   Intra-op Plan:   Post-operative Plan: Extubation in OR  Informed Consent: I have reviewed the patients History and Physical, chart, labs and discussed the procedure including the risks, benefits and alternatives for the proposed anesthesia with the patient or authorized representative who has indicated his/her understanding and acceptance.     Plan Discussed with:   Anesthesia Plan Comments:         Anesthesia Quick Evaluation

## 2017-06-03 NOTE — Progress Notes (Signed)
Sleeping quietly. resp adequate/nonlabored. Vital signs stable. Waiting on bed availability. Will continue to monitor.

## 2017-06-03 NOTE — H&P (Signed)
Preoperative History and Physical  [name] is a 41 y.o. G0P0 with Patient's last menstrual period was 06/03/2017. admitted for a supracervical abdominal hysterectomy with removal of both Fallopian tubes.   See note from 05/21/2017 visit below: Patient is seen today as a work in from Dr. Vickey Sages office The patient had been in her usual state of good health until 6 days ago when she began to have right lower quadrant pain She has had bladder infections in the past and thought that that's what was going on again She had some extra antibiotic and hand and decided take it but her pain has worsened and she did not improve She went in to see Dr. 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 causing dramatic uptake in her pain Exam revealed 26 weeks size fibroids Management:: Certainly this is not an acute uterine growth because there are multiple fibroids and is not consistent with a leiomyosarcoma picture However I do believe that the fibroids have evidently out strip to the blood supply of the fibroids causing acute pain which I would term degenerating Sonogram confirms this The patient is nulliparous and states that she is pretty much given up in pregnancy We discussed options in general regarding management of the fibroids including myomectomy versus supracervical hysterectomy We discussed the pluses and minuses of this including excessive blood loss need for transfusion much more likely with a myomectomy but can certainly happen also with hysterectomy Tentatively we have scheduled the patient for surgery and July 25 but she is can decide between myomectomy and hysterectomy It is too big for her laparoscopic approach and she will need an  abdominal supracervical hysterectomy Pt contemplated options noted above including myomectomy vs hysterectomy and she has opted for a supracervical hysterectomy with planned preservation of the ovaries  PMH:    Past Medical History:  Diagnosis Date  . Anemia   . GERD (gastroesophageal reflux disease)   . Helicobacter pylori (H. pylori)    prevpac  . Hemorrhoids     PSH:     Past Surgical History:  Procedure Laterality Date  . APPENDECTOMY     41 years old  . ESOPHAGOGASTRODUODENOSCOPY  09/14/2007   Dr. Hurley Cisco quadrant distal esophageal erosion consistant with  moderately severe erosive reflux esophagitits, o/w normal esophagus.,tiny antral erosions o/w normal stomach, inflammation on bx  . ESOPHAGOGASTRODUODENOSCOPY (EGD) WITH ESOPHAGEAL DILATION  12/09/2012   Dr. Gala Romney- normal esophagus s/p maloney dilation, small hiatal hernia    POb/GynH:      OB History    Gravida Para Term Preterm AB Living             0   SAB TAB Ectopic Multiple Live Births                  SH:   Social History  Substance Use Topics  . Smoking status: Never Smoker  . Smokeless tobacco: Never Used  . Alcohol use No    FH:   History reviewed. No pertinent family history.   Allergies:  Allergies  Allergen Reactions  . Tamiflu [Oseltamivir Phosphate] Other (See Comments)    Anxiety, insomnia, shaky, vomiting  . Levofloxacin   . Sulfonamide Derivatives Hives    Medications:  Current Facility-Administered Medications:  .  bupivacaine liposome (EXPAREL) 1.3 % injection 266 mg, 20 mL, Infiltration, Once, Worth Kober H, MD .  ceFAZolin (ANCEF) IVPB 2g/100 mL premix, 2 g, Intravenous, On Call to OR, Florian Buff, MD .  ketorolac (TORADOL) 30 MG/ML injection 30 mg, 30 mg, Intravenous, Once, Florian Buff, MD  Review of Systems:   Review of Systems  Constitutional: Negative for fever, chills, weight loss, malaise/fatigue and diaphoresis.  HENT: Negative for hearing loss, ear pain,  nosebleeds, congestion, sore throat, neck pain, tinnitus and ear discharge.   Eyes: Negative for blurred vision, double vision, photophobia, pain, discharge and redness.  Respiratory: Negative for cough, hemoptysis, sputum production, shortness of breath, wheezing and stridor.   Cardiovascular: Negative for chest pain, palpitations, orthopnea, claudication, leg swelling and PND.  Gastrointestinal: Positive for abdominal pain. Negative for heartburn, nausea, vomiting, diarrhea, constipation, blood in stool and melena.  Genitourinary: Negative for dysuria, urgency, frequency, hematuria and flank pain.  Musculoskeletal: Negative for myalgias, back pain, joint pain and falls.  Skin: Negative for itching and rash.  Neurological: Negative for dizziness, tingling, tremors, sensory change, speech change, focal weakness, seizures, loss of consciousness, weakness and headaches.  Endo/Heme/Allergies: Negative for environmental allergies and polydipsia. Does not bruise/bleed easily.  Psychiatric/Behavioral: Negative for depression, suicidal ideas, hallucinations, memory loss and substance abuse. The patient is not nervous/anxious and does not have insomnia.      PHYSICAL EXAM:  Blood pressure (!) 129/91, pulse 84, temperature 98.3 F (36.8 C), temperature source Oral, resp. rate 18, height 5\' 1"  (1.549 m), weight 159 lb (72.1 kg), last menstrual period 06/03/2017, SpO2 98 %.    Vitals reviewed. Constitutional: She is oriented to person, place, and time. She appears well-developed and well-nourished.  HENT:  Head: Normocephalic and atraumatic.  Right Ear: External ear normal.  Left Ear: External ear normal.  Nose: Nose normal.  Mouth/Throat: Oropharynx is clear and moist.  Eyes: Conjunctivae and EOM are normal. Pupils are equal, round, and reactive to light. Right eye exhibits no discharge. Left eye exhibits no discharge. No scleral icterus.  Neck: Normal range of motion. Neck supple. No tracheal  deviation present. No thyromegaly present.  Cardiovascular: Normal rate, regular rhythm, normal heart sounds and intact distal pulses.  Exam reveals no gallop and no friction rub.   No murmur heard. Respiratory: Effort normal and breath sounds normal. No respiratory distress. She has no wheezes. She has no rales. She exhibits no tenderness.  GI: Soft. Bowel sounds are normal. She exhibits no distension and no mass. There is tenderness. There is no rebound and no guarding.  Genitourinary:       Vulva is normal without lesions Vagina is pink moist without discharge Cervix normal in appearance and pap is normal Uterus is 26 weeks size due to multiple fibroids Adnexa is negative with normal sized ovaries by sonogram  Musculoskeletal: Normal range of motion. She exhibits no edema and no tenderness.  Neurological: She is alert and oriented to person, place, and time. She has normal reflexes. She displays normal reflexes. No cranial nerve deficit. She exhibits normal muscle tone. Coordination normal.  Skin: Skin is warm and dry. No rash noted. No erythema. No pallor.  Psychiatric: She has a normal mood and affect. Her behavior is normal. Judgment and thought content normal.    Labs: Results for orders placed or performed during the hospital encounter of 05/29/17 (from the past 336 hour(s))  Surgical pcr screen   Collection Time: 05/29/17  8:19 AM  Result Value Ref Range   MRSA, PCR NEGATIVE NEGATIVE   Staphylococcus aureus NEGATIVE NEGATIVE  CBC   Collection Time: 05/29/17  8:19 AM  Result Value Ref Range   WBC 6.4 4.0 - 10.5 K/uL   RBC 4.33 3.87 - 5.11 MIL/uL   Hemoglobin 12.5 12.0 - 15.0 g/dL   HCT 37.4 36.0 - 46.0 %   MCV 86.4 78.0 - 100.0 fL   MCH 28.9 26.0 - 34.0 pg   MCHC 33.4 30.0 - 36.0 g/dL   RDW 15.5 11.5 - 15.5 %   Platelets 326 150 - 400 K/uL  Comprehensive metabolic panel   Collection Time: 05/29/17  8:19 AM  Result Value Ref Range   Sodium 138 135 - 145 mmol/L    Potassium 3.4 (L) 3.5 - 5.1 mmol/L   Chloride 106 101 - 111 mmol/L   CO2 24 22 - 32 mmol/L   Glucose, Bld 86 65 - 99 mg/dL   BUN 7 6 - 20 mg/dL   Creatinine, Ser 0.78 0.44 - 1.00 mg/dL   Calcium 9.3 8.9 - 10.3 mg/dL   Total Protein 7.5 6.5 - 8.1 g/dL   Albumin 4.0 3.5 - 5.0 g/dL   AST 19 15 - 41 U/L   ALT 11 (L) 14 - 54 U/L   Alkaline Phosphatase 76 38 - 126 U/L   Total Bilirubin 0.5 0.3 - 1.2 mg/dL   GFR calc non Af Amer >60 >60 mL/min   GFR calc Af Amer >60 >60 mL/min   Anion gap 8 5 - 15  hCG, quantitative, pregnancy   Collection Time: 05/29/17  8:19 AM  Result Value Ref Range   hCG, Beta Chain, Quant, S 1 <5 mIU/mL  Rapid HIV screen (HIV 1/2 Ab+Ag)   Collection Time: 05/29/17  8:19 AM  Result Value Ref Range   HIV-1 P24 Antigen - HIV24 NON REACTIVE NON REACTIVE   HIV 1/2 Antibodies NON REACTIVE NON REACTIVE   Interpretation (HIV Ag Ab)      A non reactive test result means that HIV 1 or HIV 2 antibodies and HIV 1 p24 antigen were not detected in the specimen.  Urinalysis, Routine w reflex microscopic   Collection Time: 05/29/17  8:19 AM  Result Value Ref Range   Color, Urine YELLOW YELLOW   APPearance HAZY (A) CLEAR   Specific Gravity, Urine 1.003 (L) 1.005 - 1.030   pH 6.0 5.0 - 8.0   Glucose, UA NEGATIVE NEGATIVE mg/dL   Hgb urine dipstick SMALL (A) NEGATIVE   Bilirubin Urine NEGATIVE NEGATIVE   Ketones, ur NEGATIVE NEGATIVE mg/dL   Protein, ur NEGATIVE NEGATIVE mg/dL   Nitrite NEGATIVE NEGATIVE   Leukocytes, UA LARGE (A) NEGATIVE   RBC / HPF 6-30 0 - 5 RBC/hpf   WBC, UA 6-30 0 - 5 WBC/hpf   Bacteria, UA RARE (A) NONE SEEN   Squamous Epithelial / LPF 6-30 (A) NONE SEEN   Mucous PRESENT     EKG: No orders found for this or any previous visit.  Imaging Studies: US Abdomen Complete  Result Date: 05/19/2017 CLINICAL DATA:  Abdominal pain, RIGHT lower quadrant pain. Possible ovarian cyst. EXAM: ABDOMEN ULTRASOUND COMPLETE COMPARISON:  None. FINDINGS:  Gallbladder: No gallstones or wall thickening visualized. Mildly contracted, patient was not NPO. No sonographic Murphy sign noted by sonographer. Common bile duct: Diameter: 4 mm Liver: No focal lesion identified. Within normal limits in parenchymal echogenicity. Hepatopetal portal vein. IVC: No abnormality visualized. Pancreas: Visualized portion  unremarkable. Spleen: Size and appearance within normal limits. Right Kidney: Length: 7.9 cm. Echogenicity within normal limits. No mass or hydronephrosis visualized. Left Kidney: Length: 9 cm. Echogenicity within normal limits. No mass or hydronephrosis visualized. Abdominal aorta: No aneurysm visualized. Other findings: None. IMPRESSION: Negative complete abdominal ultrasound. Electronically Signed   By: Elon Alas M.D.   On: 05/19/2017 17:03   US Transvaginal Non-ob  Result Date: 05/19/2017 CLINICAL DATA:  RIGHT lower quadrant abdominal pain EXAM: TRANSABDOMINAL AND TRANSVAGINAL ULTRASOUND OF PELVIS TECHNIQUE: Both transabdominal and transvaginal ultrasound examinations of the pelvis were performed. Transabdominal technique was performed for global imaging of the pelvis including uterus, ovaries, adnexal regions, and pelvic cul-de-sac. It was necessary to proceed with endovaginal exam following the transabdominal exam to visualize the endometrium and ovaries. COMPARISON:  None recent; remote CT abdomen and pelvis 05/18/2007 FINDINGS: Uterus Measurements: Uncertain; uterus appears significantly enlarged measuring at least 13.6 x 6.7 x 9.4 cm though full extent is only questionably visualized due to shadowing from bowel gas. At least 2 large leiomyomata are seen at the upper uterine segment, measuring 8.4 x 6.1 x 7.7 cm and 5.4 x 4.6 x 5.4 cm. Patient had 2 small leiomyomata on the remote CT exam from 2008. Endometrium Obscured by distortion of the uterus by the above large leiomyomata Right ovary Not identified on either transabdominal or endovaginal imaging  likely due to displacement by a markedly enlarged uterus and shadowing by bowel gas Left ovary Not identified on either transabdominal or endovaginal imaging likely due to displacement by a markedly enlarged uterus and shadowing by bowel gas Other findings No gross free pelvic fluid or adnexal masses IMPRESSION: Markedly enlarged uterus likely at least 2 large leiomyomata measuring 8.4 cm and 5.4 cm in greatest sizes. Nonvisualization of ovaries. Due to suboptimal visualization of the uterus and nonvisualization of the ovaries, recommend pelvic MRI for further assessment. Electronically Signed   By: Lavonia Dana M.D.   On: 05/19/2017 17:51   US Pelvis Complete  Result Date: 05/19/2017 CLINICAL DATA:  RIGHT lower quadrant abdominal pain EXAM: TRANSABDOMINAL AND TRANSVAGINAL ULTRASOUND OF PELVIS TECHNIQUE: Both transabdominal and transvaginal ultrasound examinations of the pelvis were performed. Transabdominal technique was performed for global imaging of the pelvis including uterus, ovaries, adnexal regions, and pelvic cul-de-sac. It was necessary to proceed with endovaginal exam following the transabdominal exam to visualize the endometrium and ovaries. COMPARISON:  None recent; remote CT abdomen and pelvis 05/18/2007 FINDINGS: Uterus Measurements: Uncertain; uterus appears significantly enlarged measuring at least 13.6 x 6.7 x 9.4 cm though full extent is only questionably visualized due to shadowing from bowel gas. At least 2 large leiomyomata are seen at the upper uterine segment, measuring 8.4 x 6.1 x 7.7 cm and 5.4 x 4.6 x 5.4 cm. Patient had 2 small leiomyomata on the remote CT exam from 2008. Endometrium Obscured by distortion of the uterus by the above large leiomyomata Right ovary Not identified on either transabdominal or endovaginal imaging likely due to displacement by a markedly enlarged uterus and shadowing by bowel gas Left ovary Not identified on either transabdominal or endovaginal imaging likely  due to displacement by a markedly enlarged uterus and shadowing by bowel gas Other findings No gross free pelvic fluid or adnexal masses IMPRESSION: Markedly enlarged uterus likely at least 2 large leiomyomata measuring 8.4 cm and 5.4 cm in greatest sizes. Nonvisualization of ovaries. Due to suboptimal visualization of the uterus and nonvisualization of the ovaries, recommend pelvic MRI for further assessment. Electronically  Signed   By: Lavonia Dana M.D.   On: 05/19/2017 17:51      Assessment: 26 weeks sized fibroid uterus Abdominal pain due to degenerating fibroids Patient Active Problem List   Diagnosis Date Noted  . GERD 03/07/2010  . EPIGASTRIC PAIN 03/07/2010  . HELICOBACTER PYLORI INFECTION, HX OF 03/07/2010    Plan: Supracervical abdominal hysterectomy with removal of both Fallopian tubes  Pt understands the risks of surgery including but not limited t  excessive bleeding requiring transfusion or reoperation, post-operative infection requiring prolonged hospitalization or re-hospitalization and antibiotic therapy, and damage to other organs including bladder, bowel, ureters and major vessels.  The patient also understands the alternative treatment options which were discussed in full.  All questions were answered.  Cedrik Heindl H 06/03/2017 7:04 AM   Larone Kliethermes H 06/03/2017 7:03 AM

## 2017-06-03 NOTE — Anesthesia Procedure Notes (Signed)
Procedure Name: Intubation Date/Time: 06/03/2017 7:39 AM Performed by: Charmaine Downs Pre-anesthesia Checklist: Patient identified, Patient being monitored, Timeout performed, Emergency Drugs available and Suction available Patient Re-evaluated:Patient Re-evaluated prior to induction Oxygen Delivery Method: Circle System Utilized Preoxygenation: Pre-oxygenation with 100% oxygen Induction Type: IV induction and Cricoid Pressure applied Ventilation: Mask ventilation without difficulty and Oral airway inserted - appropriate to patient size Laryngoscope Size: Mac and 4 Grade View: Grade II Tube type: Oral Tube size: 7.0 mm Number of attempts: 1 Airway Equipment and Method: stylet Placement Confirmation: ETT inserted through vocal cords under direct vision,  positive ETCO2 and breath sounds checked- equal and bilateral Secured at: 22 cm Tube secured with: Tape Dental Injury: Teeth and Oropharynx as per pre-operative assessment

## 2017-06-03 NOTE — Progress Notes (Signed)
Dr Elonda Husky at bedside to check pt. Pt continues to be restless and c/o postop abd pain. Yelling. Moaning/groaning.  Order given per Dr Elonda Husky for ativan. Dr Elonda Husky viewed Hgb 8.8 results. No further orders given.

## 2017-06-03 NOTE — Anesthesia Postprocedure Evaluation (Signed)
Anesthesia Post Note  Patient: Ilham A Stefanko  Procedure(s) Performed: Procedure(s) (LRB): HYSTERECTOMY SUPRACERVICAL ABDOMINAL (N/A) left salpingectomy (Left) SALPINGO OOPHORECTOMY (Right)  Patient location during evaluation: PACU Anesthesia Type: General Level of consciousness: awake and patient cooperative Pain management: pain level controlled Vital Signs Assessment: post-procedure vital signs reviewed and stable Respiratory status: spontaneous breathing, nonlabored ventilation and respiratory function stable Cardiovascular status: blood pressure returned to baseline Postop Assessment: no signs of nausea or vomiting Anesthetic complications: no     Last Vitals:  Vitals:   06/03/17 0720 06/03/17 0725  BP: (!) 142/94 (!) 142/94  Pulse:    Resp: 18 (!) 22  Temp:      Last Pain:  Vitals:   06/03/17 0656  TempSrc: Oral                 Tinzlee Craker J

## 2017-06-03 NOTE — Progress Notes (Signed)
Blood drawn and sent to lab for hgb/hct results.

## 2017-06-03 NOTE — Op Note (Signed)
Preoperative diagnosis:  1.  26 week sized fibroid uterus                                          2.  Abdominal pain due to degenerating fibroids                                         3.  Patient elects to have bilateral salpingectomy for ovarian cancer prophylaxis                                         4.    Postoperative diagnosis:  Same as above plus right hydrosalpinx with ovarian involvement as well consistent with the past pelvic inflammatory disease and adhesions of the right tube and ovary to the pelvic sidewall  Procedure:  Abdominal hysterectomy, supracervical with removal of right tube and ovary and removal of left fallopian tube(preservation of the left ovary)  Surgeon:  Florian Buff  Assistant:    Anesthesia:  General endotracheal  Preoperative clinical summary:  I saw the patient initially on May 21 2017 as a emergent referral from Dr. Karie Kirks.  The patient had acute onset of left lower quadrant abdominal pain she thought it was related to urinary tract infection began taken antibiotics but it did not improve.  She saw Dr. Karie Kirks who ordered a an abdominal and pelvic ultrasound which revealed a significantly enlarged fibroid uterus. I then saw her 2 days later and felt as if she was suffering with degeneration of one or more leiomyoma causing the acute pain.  She had multiple fibroids and this made it much less likely that this was pain from rapid growth such as a leiomyosarcoma.  We discussed surgical options including myomectomy versus hysterectomy and the patient chose a abdominal supracervical hysterectomy with removal of both fallopian tubes prophylactically.  Intraoperative findings: Multiple fibroids probably greater than 15 in total, 26 weeks size filling the pelvis sidewall to sidewall and front to back as well, probable compression of the right ureter with enlarged right ureter, right hydrosalpinx with adherent right ovary with ovarian cyst also on the right  ovary  Description of operation:  Patient was taken to the operating room and placed in the supine position where she underwent general endotracheal anesthesia.  She was then prepped and draped in the usual sterile fashion and a Foley catheter was placed for continuous bladder drainage.  A Pfannenstiel skin incision was made and carried down sharply to the rectus fascia which was scored in the midline and extended laterally.  The fascia was taken off the muscles superiorly and inferiorly without difficulty.  The muscles were divided.  The peritoneal cavity was entered.  The uterus was impressively enlarged 26 weeks' size filling the pelvis sidewall to sidewall and also front to back with many many fibroids.  The only way that I could get to the uterine blood supply on both sides was to begin doing myomectomies.  I probably performed 6 or 7 myomectomies before I could get to the uterine blood supply.  I used the electrocautery unit thyroid tenaculum and manual dissection to remove the fibroids doing the best I could to manage blood loss along  the way with pressure and electrocautery. Once I could get to the uterine blood supply bilaterally house able to deliver the entire remaining part of the uterus through the abdominal incision. I then took the bladder down without difficulty. I then crossclamped both uterine vessels at the level of the internal os of the cervix and amputated the uterus at this point.  Transfixion suture ligation was performed with good hemostasis.  I then took one more pedicle down the cervix medial to the uterine blood supply bilaterally.  This pedicle was clamped cut and transfixion suture ligated as well with good hemostasis and the distal portion of the uterus and proximal portion of the cervix were removed.  I did a small reverse cone and then was able to close the cervix with interrupted figure-of-eight sutures incorporating the posterior peritoneum which was redundant due to an  large posterior myoma.      The right ovary and tube were significantly abnormal and adherent to the pelvic sidewall down low in the posterior portion of the uterus.  She had a hydrosalpinx and adhesions which was engulfing the tube and the ovary.  I dissected the tube and the ovary off the posterior pelvic sidewall on the right and decided because of how enlarged it was an abnormal was that it was in the patient's best interest this point to remove the tube and ovary to prevent future problems with adhesion pain.  The infundibulopelvic ligament on the right was crossclamped and double suture ligated and the tube and ovary on the right removed.    The left ovary was preserved but the left fallopian tube was crossclamped and suture ligated using a fore and aft technique with good hemostasis.  I was at this point able to identify the right ureter which was significantly enlarged probably secondary to compression and it was displaced time the pelvic sidewall again probably secondary to fibroids.  The peritoneum overlying the ureter was disrupted and I was not able to achieve hemostasis with the electrocautery unit or suturing because of fear of damaging the ureter.  As result I used Arista 2 containers to provide matrix for hemostasis for this area.  Again I tried to suture it or use electrocautery unit was likely to injure the right ureter because of this dilation and location.   The pelvis was irrigated vigorously and all pedicles were examined and found to be hemostatic.    All packs were removed and all counts were correct at this point x 3.  The muscles and peritoneum were reapproximated loosely.  The fascia was closed with 0 Vicryl running.    The skin was closed using 3-0 Vicryl on a Keith needle in a subcuticular fashion.  Dermabond was then applied for additional wound integrity and to serve as a postoperative bacterial barrier.  The patient was awakened from anesthesia taken to the recovery room in  good stable condition. All sponge instrument and needle counts were correct x 3.  The patient received Ancef and Toradol prophylactically preoperatively.  Estimated blood loss for the procedure was 1200  cc. a hemoglobin and hematocrit will be obtained in the PACU for interval evaluation  Florian Buff, MD 06/03/2017 10:05 AM

## 2017-06-03 NOTE — Progress Notes (Signed)
From OR. Awake. Restless. Yelling. Moaning/groaning. C/O postop abd pain. Reassurance given. Oriented to place per nurse.

## 2017-06-04 LAB — CBC
HCT: 25.2 % — ABNORMAL LOW (ref 36.0–46.0)
HEMOGLOBIN: 8.4 g/dL — AB (ref 12.0–15.0)
MCH: 28.8 pg (ref 26.0–34.0)
MCHC: 33.3 g/dL (ref 30.0–36.0)
MCV: 86.3 fL (ref 78.0–100.0)
PLATELETS: 367 10*3/uL (ref 150–400)
RBC: 2.92 MIL/uL — ABNORMAL LOW (ref 3.87–5.11)
RDW: 13.8 % (ref 11.5–15.5)
WBC: 9.5 10*3/uL (ref 4.0–10.5)

## 2017-06-04 LAB — BASIC METABOLIC PANEL
Anion gap: 7 (ref 5–15)
BUN: 5 mg/dL — AB (ref 6–20)
CALCIUM: 8.6 mg/dL — AB (ref 8.9–10.3)
CO2: 26 mmol/L (ref 22–32)
CREATININE: 0.72 mg/dL (ref 0.44–1.00)
Chloride: 102 mmol/L (ref 101–111)
Glucose, Bld: 134 mg/dL — ABNORMAL HIGH (ref 65–99)
Potassium: 4 mmol/L (ref 3.5–5.1)
SODIUM: 135 mmol/L (ref 135–145)

## 2017-06-04 MED ORDER — KETOROLAC TROMETHAMINE 10 MG PO TABS: 10.0000 mg | ORAL_TABLET | Freq: Three times a day (TID) | ORAL | 0 refills | Status: DC | PRN

## 2017-06-04 MED ORDER — KETOROLAC TROMETHAMINE 30 MG/ML IJ SOLN
30.0000 mg | Freq: Once | INTRAMUSCULAR | Status: AC
  Administered 2017-06-04: 30 mg via INTRAVENOUS
  Filled 2017-06-04: qty 1

## 2017-06-04 MED ORDER — SODIUM CHLORIDE 0.9 % IV SOLN
510.0000 mg | Freq: Once | INTRAVENOUS | Status: AC
  Administered 2017-06-04: 510 mg via INTRAVENOUS
  Filled 2017-06-04: qty 17

## 2017-06-04 MED ORDER — ONDANSETRON HCL 8 MG PO TABS: 8.0000 mg | ORAL_TABLET | Freq: Four times a day (QID) | ORAL | 0 refills | Status: DC | PRN

## 2017-06-04 MED ORDER — OXYCODONE-ACETAMINOPHEN 5-325 MG PO TABS: 1.0000 | ORAL_TABLET | ORAL | 0 refills | Status: DC | PRN

## 2017-06-04 NOTE — Progress Notes (Signed)
Patient ambulated with one assist to the bathroom, minimal assist with washing up this morning.

## 2017-06-04 NOTE — Progress Notes (Signed)
Patient IV removed, tolerated well. Patient given discharge instructions, family at bedside.  

## 2017-06-04 NOTE — Progress Notes (Signed)
Foley removed by nurse tech. Pt tolerated well.

## 2017-06-04 NOTE — Discharge Instructions (Signed)
Abdominal Hysterectomy, Care After °This sheet gives you information about how to care for yourself after your procedure. Your health care provider may also give you more specific instructions. If you have problems or questions, contact your health care provider. °What can I expect after the procedure? °After your procedure, it is common to have: °· Pain. °· Fatigue. °· Poor appetite. °· Less interest in sex. °· Vaginal bleeding and discharge. You may need to use a sanitary napkin after this procedure. ° °Follow these instructions at home: °Bathing °· Do not take baths, swim, or use a hot tub until your health care provider approves. Ask your health care provider if you can take showers. You may only be allowed to take sponge baths for bathing. °· Keep the bandage (dressing) dry until your health care provider says it can be removed. °Incision care °· Follow instructions from your health care provider about how to take care of your incision. Make sure you: °? Wash your hands with soap and water before you change your bandage (dressing). If soap and water are not available, use hand sanitizer. °? Change your dressing as told by your health care provider. °? Leave stitches (sutures), skin glue, or adhesive strips in place. These skin closures may need to stay in place for 2 weeks or longer. If adhesive strip edges start to loosen and curl up, you may trim the loose edges. Do not remove adhesive strips completely unless your health care provider tells you to do that. °· Check your incision area every day for signs of infection. Check for: °? Redness, swelling, or pain. °? Fluid or blood. °? Warmth. °? Pus or a bad smell. °Activity °· Do gentle, daily exercises as told by your health care provider. You may be told to take short walks every day and go farther each time. °· Do not lift anything that is heavier than 10 lb (4.5 kg), or the limit that your health care provider tells you, until he or she says that it is  safe. °· Do not drive or use heavy machinery while taking prescription pain medicine. °· Do not drive for 24 hours if you were given a medicine to help you relax (sedative). °· Follow your health care provider's instructions about exercise, driving, and general activities. Ask your health care provider what activities are safe for you. °Lifestyle °· Do not douche, use tampons, or have sex for at least 6 weeks or as told by your health care provider. °· Do not drink alcohol until your health care provider approves. °· Drink enough fluid to keep your urine clear or pale yellow. °· Try to have someone at home with you for the first 1-2 weeks to help. °· Do not use any products that contain nicotine or tobacco, such as cigarettes and e-cigarettes. These can delay healing. If you need help quitting, ask your health care provider. °General instructions °· Take over-the-counter and prescription medicines only as told by your health care provider. °· Do not take aspirin or ibuprofen. These medicines can cause bleeding. °· To prevent or treat constipation while you are taking prescription pain medicine, your health care provider may recommend that you: °? Drink enough fluid to keep your urine clear or pale yellow. °? Take over-the-counter or prescription medicines. °? Eat foods that are high in fiber, such as fresh fruits and vegetables, whole grains, and beans. °? Limit foods that are high in fat and processed sugars, such as fried and sweet foods. °· Keep all   follow-up visits as told by your health care provider. This is important. °Contact a health care provider if: °· You have chills or fever. °· You have redness, swelling, or pain around your incision. °· You have fluid or blood coming from your incision. °· Your incision feels warm to the touch. °· You have pus or a bad smell coming from your incision. °· Your incision breaks open. °· You feel dizzy or light-headed. °· You have pain or bleeding when you urinate. °· You  have persistent diarrhea. °· You have persistent nausea and vomiting. °· You have abnormal vaginal discharge. °· You have a rash. °· You have any type of abnormal reaction or you develop an allergy to your medicine. °· Your pain medicine does not help. °Get help right away if: °· You have a fever and your symptoms suddenly get worse. °· You have severe abdominal pain. °· You have shortness of breath. °· You faint. °· You have pain, swelling, or redness in your leg. °· You have heavy vaginal bleeding with blood clots. °Summary °· After your procedure, it is common to have pain, fatigue and vaginal discharge. °· Do not take baths, swim, or use a hot tub until your health care provider approves. Ask your health care provider if you can take showers. You may only be allowed to take sponge baths for bathing. °· Follow your health care provider's instructions about exercise, driving, and general activities. Ask your health care provider what activities are safe for you. °· Do not lift anything that is heavier than 10 lb (4.5 kg), or the limit that your health care provider tells you, until he or she says that it is safe. °· Try to have someone at home with you for the first 1-2 weeks to help. °This information is not intended to replace advice given to you by your health care provider. Make sure you discuss any questions you have with your health care provider. °Document Released: 05/16/2005 Document Revised: 10/15/2016 Document Reviewed: 10/15/2016 °Elsevier Interactive Patient Education © 2017 [employer] ° °

## 2017-06-04 NOTE — Discharge Summary (Signed)
Physician Discharge Summary  Patient ID: [name] MRN: [id number] DOB/AGE: Jun 15, 1976 41 y.o.  Admit date: 06/03/2017 Discharge date: 06/04/2017  Admission Diagnoses: 26 week sized fibroid uterus Abdominal pain due to degenerating fibroids  Discharge Diagnoses:  Active Problems:   S/P abdominal supracervical subtotal hysterectomy   Discharged Condition: good  Hospital Course: Unremarkable postoperative course  Consults: None  Significant Diagnostic Studies: labs: Postoperative  Treatments: surgery: Abdominal supracervical hysterectomy with removal of right tube and ovary and removal of left fallopian tube, Preservation of left ovary  Discharge Exam: Blood pressure 113/62, pulse 97, temperature 98.1 F (36.7 C), temperature source Oral, resp. rate 16, height 5\' 1"  (1.549 m), weight 159 lb (72.1 kg), last menstrual period 06/03/2017, SpO2 100 %. General appearance: alert, cooperative and no distress GI: soft, non-tender; bowel sounds normal; no masses,  no organomegaly Incision is clean dry and intact Disposition: 01-Home or Self Care  Discharge Instructions    Call MD for:  persistant nausea and vomiting    Complete by:  As directed    Call MD for:  severe uncontrolled pain    Complete by:  As directed    Call MD for:  temperature >100.4    Complete by:  As directed    Diet - low sodium heart healthy    Complete by:  As directed    Driving Restrictions    Complete by:  As directed    No driving for 1 week   Increase activity slowly    Complete by:  As directed    Leave dressing on - Keep it clean, dry, and intact until clinic visit    Complete by:  As directed    Lifting restrictions    Complete by:  As directed    Do not lift more than 10 pounds for 6 weeks   Sexual Activity Restrictions    Complete by:  As directed    No sex for 6 weeks     Allergies as of 06/04/2017      Reactions   Tamiflu [oseltamivir Phosphate] Other (See Comments)   Anxiety,  insomnia, shaky, vomiting   Levofloxacin    Sulfonamide Derivatives Hives      Medication List    STOP taking these medications   ferrous sulfate 325 (65 FE) MG tablet     TAKE these medications   albuterol 108 (90 Base) MCG/ACT inhaler Commonly known as:  PROVENTIL HFA;VENTOLIN HFA Inhale into the lungs every 6 (six) hours as needed for wheezing or shortness of breath.   cetirizine 10 MG tablet Commonly known as:  ZYRTEC Take 10 mg by mouth daily as needed for allergies.   ketorolac 10 MG tablet Commonly known as:  TORADOL Take 1 tablet (10 mg total) by mouth every 8 (eight) hours as needed.   MULTIVITAMIN ADULT PO Take 1 tablet by mouth daily.   omeprazole 20 MG capsule Commonly known as:  PRILOSEC Take 20 mg by mouth 2 (two) times daily before a meal.   ondansetron 8 MG tablet Commonly known as:  ZOFRAN Take 1 tablet (8 mg total) by mouth every 6 (six) hours as needed for nausea.   oxyCODONE-acetaminophen 5-325 MG tablet Commonly known as:  PERCOCET/ROXICET Take 1-2 tablets by mouth every 4 (four) hours as needed (moderate to severe pain (when tolerating fluids)).   triamcinolone cream 0.1 % Commonly known as:  KENALOG Apply 1 application topically 2 (two) times daily as needed (eczema).      Follow-up Information  Florian Buff, MD Follow up on 06/11/2017.   Specialties:  Obstetrics and Gynecology, Radiology Contact information: [place] [id number] [phone]           Signed: Florian Buff 06/04/2017, 2:56 PM

## 2017-06-04 NOTE — Anesthesia Postprocedure Evaluation (Signed)
Anesthesia Post Note  Patient: [name]  Procedure(s) Performed: Procedure(s) (LRB): HYSTERECTOMY SUPRACERVICAL ABDOMINAL (N/A) left salpingectomy (Left) SALPINGO OOPHORECTOMY (Right)  Patient location during evaluation: Nursing Unit Anesthesia Type: General Level of consciousness: awake and alert and oriented Pain management: pain level controlled Vital Signs Assessment: post-procedure vital signs reviewed and stable Respiratory status: spontaneous breathing Cardiovascular status: blood pressure returned to baseline Postop Assessment: no signs of nausea or vomiting Anesthetic complications: no     Last Vitals:  Vitals:   06/03/17 2132 06/04/17 0534  BP: 131/86 125/80  Pulse: 93 95  Resp: 18 18  Temp: 36.7 C 36.9 C    Last Pain:  Vitals:   06/04/17 0935  TempSrc:   PainSc: 5                  Cleto Claggett

## 2017-06-04 NOTE — Addendum Note (Signed)
Addendum  created 06/04/17 1439 by Ollen Bowl, CRNA   Sign clinical note

## 2017-06-05 ENCOUNTER — Encounter (HOSPITAL_COMMUNITY): Payer: Self-pay | Admitting: Obstetrics & Gynecology

## 2017-06-08 ENCOUNTER — Telehealth: Payer: Self-pay | Admitting: Obstetrics & Gynecology

## 2017-06-08 ENCOUNTER — Emergency Department (HOSPITAL_COMMUNITY): Payer: BC Managed Care – PPO

## 2017-06-08 ENCOUNTER — Emergency Department (HOSPITAL_COMMUNITY)
Admission: EM | Admit: 2017-06-08 | Discharge: 2017-06-08 | Disposition: A | Discharge status: Home / Self Care | Payer: BC Managed Care – PPO | Attending: Emergency Medicine | Admitting: Emergency Medicine

## 2017-06-08 ENCOUNTER — Encounter (HOSPITAL_COMMUNITY): Payer: Self-pay | Admitting: Emergency Medicine

## 2017-06-08 DIAGNOSIS — R1084 Generalized abdominal pain: Secondary | ICD-10-CM | POA: Diagnosis present

## 2017-06-08 DIAGNOSIS — K5900 Constipation, unspecified: Secondary | ICD-10-CM | POA: Insufficient documentation

## 2017-06-08 DIAGNOSIS — R109 Unspecified abdominal pain: Secondary | ICD-10-CM

## 2017-06-08 DIAGNOSIS — Z79899 Other long term (current) drug therapy: Secondary | ICD-10-CM | POA: Insufficient documentation

## 2017-06-08 LAB — BASIC METABOLIC PANEL
Anion gap: 8 (ref 5–15)
BUN: 6 mg/dL (ref 6–20)
CHLORIDE: 103 mmol/L (ref 101–111)
CO2: 26 mmol/L (ref 22–32)
CREATININE: 0.75 mg/dL (ref 0.44–1.00)
Calcium: 9.2 mg/dL (ref 8.9–10.3)
GFR calc Af Amer: >60 mL/min (ref >60)
GFR calc non Af Amer: >60 mL/min (ref >60)
GLUCOSE: 106 mg/dL — AB (ref 65–99)
POTASSIUM: 3.2 mmol/L — AB (ref 3.5–5.1)
SODIUM: 137 mmol/L (ref 135–145)

## 2017-06-08 LAB — CBC WITH DIFFERENTIAL/PLATELET
Basophils Absolute: 0.1 10*3/uL (ref 0.0–0.1)
Basophils Relative: 1 %
EOS ABS: 0.3 10*3/uL (ref 0.0–0.7)
EOS PCT: 3 %
HCT: 26 % — ABNORMAL LOW (ref 36.0–46.0)
Hemoglobin: 8.6 g/dL — ABNORMAL LOW (ref 12.0–15.0)
LYMPHS ABS: 1.6 10*3/uL (ref 0.7–4.0)
LYMPHS PCT: 18 %
MCH: 29.5 pg (ref 26.0–34.0)
MCHC: 33.1 g/dL (ref 30.0–36.0)
MCV: 89 fL (ref 78.0–100.0)
MONO ABS: 0.4 10*3/uL (ref 0.1–1.0)
MONOS PCT: 5 %
Neutro Abs: 6.7 10*3/uL (ref 1.7–7.7)
Neutrophils Relative %: 73 %
PLATELETS: 563 10*3/uL — AB (ref 150–400)
RBC: 2.92 MIL/uL — AB (ref 3.87–5.11)
RDW: 15.4 % (ref 11.5–15.5)
WBC: 9.1 10*3/uL (ref 4.0–10.5)

## 2017-06-08 MED ORDER — IOPAMIDOL (ISOVUE-300) INJECTION 61%
INTRAVENOUS | Status: AC
  Filled 2017-06-08: qty 30

## 2017-06-08 MED ORDER — IOPAMIDOL (ISOVUE-300) INJECTION 61%
100.0000 mL | Freq: Once | INTRAVENOUS | Status: AC | PRN
  Administered 2017-06-08: 100 mL via INTRAVENOUS

## 2017-06-08 MED ORDER — SODIUM CHLORIDE 0.9 % IV BOLUS (SEPSIS)
500.0000 mL | Freq: Once | INTRAVENOUS | Status: AC
  Administered 2017-06-08: 500 mL via INTRAVENOUS

## 2017-06-08 MED ORDER — POLYETHYLENE GLYCOL 3350 17 G PO PACK: 17.0000 g | PACK | Freq: Every day | ORAL | 0 refills | Status: DC

## 2017-06-08 MED ORDER — MORPHINE SULFATE (PF) 4 MG/ML IV SOLN: 4.0000 mg | Freq: Once | INTRAVENOUS | Status: DC

## 2017-06-08 NOTE — ED Notes (Signed)
Soap suds enema administered; pt had small BM in BSC from first soap suds enema; pt encouraged to call nurse for assistance

## 2017-06-08 NOTE — ED Notes (Signed)
Stool results: small balls (moderate) with some liquid.

## 2017-06-08 NOTE — ED Provider Notes (Signed)
[facility] DEPT Provider Note   CSN: [id number] Arrival date & time: 06/08/17  1215     History   Chief Complaint Chief Complaint  Patient presents with  . Constipation    HPI [name] is a 41 y.o. female.  HPI Patient presents with abdominal pain. 5 days ago had abdominal hysterectomy by Dr.Eure. States she has not had a bowel movement since. States now his pain from a hysterectomy and a more diffuse pain. Somewhat crampy. Slight nausea. No vomiting. No fevers. States she has had some chills. Had a couple days of oxycodone but has not used pain medicines in 2 days.   Past Medical History:  Diagnosis Date  . Anemia   . GERD (gastroesophageal reflux disease)   . Helicobacter pylori (H. pylori)    prevpac  . Hemorrhoids     Patient Active Problem List   Diagnosis Date Noted  . S/P abdominal supracervical subtotal hysterectomy 06/03/2017  . GERD 03/07/2010  . EPIGASTRIC PAIN 03/07/2010  . HELICOBACTER PYLORI INFECTION, HX OF 03/07/2010    Past Surgical History:  Procedure Laterality Date  . ABDOMINAL HYSTERECTOMY    . APPENDECTOMY     41 years old  . ESOPHAGOGASTRODUODENOSCOPY  09/14/2007   Dr. Hurley Cisco quadrant distal esophageal erosion consistant with  moderately severe erosive reflux esophagitits, o/w normal esophagus.,tiny antral erosions o/w normal stomach, inflammation on bx  . ESOPHAGOGASTRODUODENOSCOPY (EGD) WITH ESOPHAGEAL DILATION  12/09/2012   Dr. Gala Romney- normal esophagus s/p maloney dilation, small hiatal hernia  . SALPINGOOPHORECTOMY Right 06/03/2017   Procedure: SALPINGO OOPHORECTOMY;  Surgeon: Florian Buff, MD;  Location: [facility];  Service: Gynecology;  Laterality: Right;  . SUPRACERVICAL ABDOMINAL HYSTERECTOMY N/A 06/03/2017   Procedure: HYSTERECTOMY SUPRACERVICAL ABDOMINAL;  Surgeon: Florian Buff, MD;  Location: [facility];  Service: Gynecology;  Laterality: N/A;  . UNILATERAL SALPINGECTOMY Left 06/03/2017   Procedure: left salpingectomy;   Surgeon: Florian Buff, MD;  Location: [facility];  Service: Gynecology;  Laterality: Left;    OB History    Gravida Para Term Preterm AB Living             0   SAB TAB Ectopic Multiple Live Births                   Home Medications    Prior to Admission medications   Medication Sig Start Date End Date Taking? Authorizing Provider  albuterol (PROVENTIL HFA;VENTOLIN HFA) 108 (90 Base) MCG/ACT inhaler Inhale into the lungs every 6 (six) hours as needed for wheezing or shortness of breath.   Yes [provider]  cetirizine (ZYRTEC) 10 MG tablet Take 10 mg by mouth daily as needed for allergies.    Yes [provider]  ketorolac (TORADOL) 10 MG tablet Take 1 tablet (10 mg total) by mouth every 8 (eight) hours as needed. 06/04/17  Yes Florian Buff, MD  Multiple Vitamins-Minerals (MULTIVITAMIN ADULT PO) Take 1 tablet by mouth daily.   Yes [provider]  omeprazole (PRILOSEC) 20 MG capsule Take 20 mg by mouth 2 (two) times daily before a meal.   Yes [provider]  ondansetron (ZOFRAN) 8 MG tablet Take 1 tablet (8 mg total) by mouth every 6 (six) hours as needed for nausea. 06/04/17  Yes Florian Buff, MD  oxyCODONE-acetaminophen (PERCOCET/ROXICET) 5-325 MG tablet Take 1-2 tablets by mouth every 4 (four) hours as needed (moderate to severe pain (when tolerating fluids)). 06/04/17  Yes Florian Buff, MD  triamcinolone cream (KENALOG) 0.1 % Apply 1 application topically 2 (two) times daily as needed (eczema).    Yes [provider]    Family History History reviewed. No pertinent family history.  Social History Social History  Substance Use Topics  . Smoking status: Never Smoker  . Smokeless tobacco: Never Used  . Alcohol use No     Allergies   Tamiflu [oseltamivir phosphate]; Levofloxacin; and Sulfonamide derivatives   Review of Systems Review of Systems  Constitutional: Negative for appetite change.  HENT: Negative for congestion.     Respiratory: Negative for shortness of breath.   Gastrointestinal: Positive for abdominal pain and constipation. Negative for diarrhea and nausea.  Genitourinary: Negative for dyspareunia and vaginal bleeding.  Musculoskeletal: Negative for back pain.  Skin: Negative for wound.  Neurological: Negative for headaches.  Hematological: Negative for adenopathy.  Psychiatric/Behavioral: Negative for confusion.     Physical Exam Updated Vital Signs BP 115/68   Pulse 90   Temp 98 F (36.7 C) (Oral)   Resp 18   Ht 5\' 1"  (1.549 m)   Wt 72.1 kg (159 lb)   LMP 06/03/2017   SpO2 100%   BMI 30.04 kg/m   Physical Exam  Constitutional: She appears well-developed.  HENT:  Head: Atraumatic.  Eyes: EOM are normal.  Neck: Neck supple.  Cardiovascular: Normal rate.   Pulmonary/Chest: Effort normal.  Abdominal: There is tenderness.  Suprapubic wound dressing is intact. Diffuse moderate tenderness of abdomen. No distention.  Musculoskeletal: She exhibits no edema.  Neurological: She is alert.  Skin: Skin is warm. Capillary refill takes less than 2 seconds.  Psychiatric: She has a normal mood and affect.     ED Treatments / Results  Labs (all labs ordered are listed, but only abnormal results are displayed) Labs Reviewed  BASIC METABOLIC PANEL - Abnormal; Notable for the following:       Result Value   Potassium 3.2 (*)    Glucose, Bld 106 (*)    All other components within normal limits  CBC WITH DIFFERENTIAL/PLATELET - Abnormal; Notable for the following:    RBC 2.92 (*)    Hemoglobin 8.6 (*)    HCT 26.0 (*)    Platelets 563 (*)    All other components within normal limits  URINALYSIS, ROUTINE W REFLEX MICROSCOPIC    EKG  EKG Interpretation None       Radiology Dg Abd 2 Views  Result Date: 06/08/2017 CLINICAL DATA:  Abdominal pain. No bowel movement since hysterectomy 06/03/2017 EXAM: ABDOMEN - 2 VIEW COMPARISON:  None. FINDINGS: The lung bases are clear. Heart size  is normal. Moderate stool is present at the rectum. There is no obstruction. There is no free air. The bowel gas pattern is otherwise normal. IMPRESSION: 1. Moderate stool at the rectum without obstruction. 2. Otherwise normal bowel gas pattern. Electronically Signed   By: San Morelle M.D.   On: 06/08/2017 14:42    Procedures Procedures (including critical care time)  Medications Ordered in ED Medications  morphine 4 MG/ML injection 4 mg (4 mg Intravenous Refused 06/08/17 1347)  iopamidol (ISOVUE-300) 61 % injection (not administered)  sodium chloride 0.9 % bolus 500 mL (0 mLs Intravenous Stopped 06/08/17 1451)     Initial Impression / Assessment and Plan / ED Course  I have reviewed the triage vital signs and the nursing notes.  Pertinent labs & imaging results that were available during my care of the patient were reviewed by me and considered in  my medical decision making (see chart for details).     Patient with constipation and abdominal pain. Hysterectomy 5 days ago. Lab work reassuring but has more tenderness than expected this time. Rectal exam did show stool in the vault but no fecal impaction. Will get CT scan. Care turned over to Dr. Roderic Palau.  Final Clinical Impressions(s) / ED Diagnoses   Final diagnoses:  Abdominal pain  Constipation, unspecified constipation type  Abdominal pain, unspecified abdominal location    New Prescriptions New Prescriptions   No medications on file     Davonna Belling, MD 06/08/17 [phone]

## 2017-06-08 NOTE — Telephone Encounter (Signed)
Patient states she has not had a bowel movement since last Tuesday and is now having abdominal pain. She was told she was going to be prescribed something at discharge but nothing was sent in. Informed patient that she could take an over the counter stool softner like Colace or [place] daily until she has a bowl movement and also that any type of pain medication can cause constipation. States she is at the ED now because she could not wait.

## 2017-06-08 NOTE — Discharge Instructions (Signed)
Start taking MiraLAX and follow-up with Dr. Glo Herring at the OB/GYN clinic here in [place] at 8:30 AM Wednesday

## 2017-06-08 NOTE — ED Notes (Signed)
Soap suds enema administered, pt encouraged to call nurse for assistance if needed; BSC at bedside

## 2017-06-08 NOTE — ED Provider Notes (Signed)
Patient with constipation. She was given an enema with some relief. She also has a possible hematoma in her pelvis from surgery this week. I spoke with Dr. Glo Herring and he stated the patient can go home on MiraLAX and see him Wednesday at 8:30 AM in the office   Milton Ferguson, MD 06/08/17 2107

## 2017-06-08 NOTE — ED Triage Notes (Signed)
Patient states she had a hysterectomy on July 25 and has not had a bowel movement since surgery. States she was given oxycodone for pain but has not taken any pain medication since Saturday.

## 2017-06-11 ENCOUNTER — Ambulatory Visit (INDEPENDENT_AMBULATORY_CARE_PROVIDER_SITE_OTHER): Payer: BC Managed Care – PPO | Admitting: Obstetrics & Gynecology

## 2017-06-11 ENCOUNTER — Encounter: Payer: Self-pay | Admitting: Obstetrics & Gynecology

## 2017-06-11 ENCOUNTER — Telehealth: Payer: Self-pay | Admitting: Obstetrics & Gynecology

## 2017-06-11 VITALS — BP 100/68 | HR 67 | Wt 148.0 lb

## 2017-06-11 DIAGNOSIS — Z9889 Other specified postprocedural states: Secondary | ICD-10-CM

## 2017-06-11 DIAGNOSIS — Z90710 Acquired absence of both cervix and uterus: Secondary | ICD-10-CM

## 2017-06-11 NOTE — Progress Notes (Signed)
  HPI: Patient returns for routine postoperative follow-up having undergone supracervical abdominal hysterectomy with removal of right salpingo oophorectomy and removal of left Fallopian tube on 06/03/2017.  The patient's immediate postoperative recovery has been unremarkable. Since hospital discharge the patient reports incisional pain, post op constipation.   Current Outpatient Prescriptions: albuterol (PROVENTIL HFA;VENTOLIN HFA) 108 (90 Base) MCG/ACT inhaler, Inhale into the lungs every 6 (six) hours as needed for wheezing or shortness of breath., Disp: , Rfl:  omeprazole (PRILOSEC) 20 MG capsule, Take 20 mg by mouth 2 (two) times daily before a meal., Disp: , Rfl:  polyethylene glycol (MIRALAX / GLYCOLAX) packet, Take 17 g by mouth daily., Disp: 14 each, Rfl: 0 triamcinolone cream (KENALOG) 0.1 %, Apply 1 application topically 2 (two) times daily as needed (eczema). , Disp: , Rfl:  cetirizine (ZYRTEC) 10 MG tablet, Take 10 mg by mouth daily as needed for allergies. , Disp: , Rfl:  ketorolac (TORADOL) 10 MG tablet, Take 1 tablet (10 mg total) by mouth every 8 (eight) hours as needed. (Patient not taking: Reported on 06/11/2017), Disp: 15 tablet, Rfl: 0 Multiple Vitamins-Minerals (MULTIVITAMIN ADULT PO), Take 1 tablet by mouth daily., Disp: , Rfl:  ondansetron (ZOFRAN) 8 MG tablet, Take 1 tablet (8 mg total) by mouth every 6 (six) hours as needed for nausea. (Patient not taking: Reported on 06/11/2017), Disp: 20 tablet, Rfl: 0 oxyCODONE-acetaminophen (PERCOCET/ROXICET) 5-325 MG tablet, Take 1-2 tablets by mouth every 4 (four) hours as needed (moderate to severe pain (when tolerating fluids)). (Patient not taking: Reported on 06/11/2017), Disp: 40 tablet, Rfl: 0  No current facility-administered medications for this visit.     Blood pressure 100/68, pulse 67, weight 148 lb (67.1 kg), last menstrual period 06/03/2017.  Physical Exam: Clean dry intact  Diagnostic  Tests:   Pathology: benign  Impression: S/p supracervical hyst RSO  Plan: Routine post op care  Follow up: 4  weeks  Florian Buff, MD

## 2017-06-11 NOTE — Telephone Encounter (Signed)
Spoke with pt letting her know she can take a regular shower. Pt voiced understanding. [place]

## 2017-06-12 ENCOUNTER — Other Ambulatory Visit (INDEPENDENT_AMBULATORY_CARE_PROVIDER_SITE_OTHER): Payer: BC Managed Care – PPO

## 2017-06-12 ENCOUNTER — Telehealth: Payer: Self-pay | Admitting: Obstetrics & Gynecology

## 2017-06-12 DIAGNOSIS — R300 Dysuria: Secondary | ICD-10-CM

## 2017-06-12 LAB — POCT URINALYSIS DIPSTICK
Blood, UA: NEGATIVE
Glucose, UA: NEGATIVE
KETONES UA: NEGATIVE
LEUKOCYTES UA: NEGATIVE
Nitrite, UA: NEGATIVE
PROTEIN UA: NEGATIVE

## 2017-06-12 NOTE — Telephone Encounter (Signed)
Pt called stating that she was having some pain with urination after her surgery. She is worried that since she had a catheter in at the hospital during surgery that she may be getting a UTI. Pt came by and left a urine sample. Urine was negative. I informed pt that we would send her urine for a culture to see if anything grew. Informed pt if it did then we would send something in to treat it. Pt verbalized understanding.

## 2017-06-14 LAB — URINE CULTURE

## 2017-06-15 NOTE — Telephone Encounter (Signed)
noted 

## 2017-07-07 ENCOUNTER — Ambulatory Visit (INDEPENDENT_AMBULATORY_CARE_PROVIDER_SITE_OTHER): Payer: BC Managed Care – PPO | Admitting: Obstetrics & Gynecology

## 2017-07-07 ENCOUNTER — Telehealth: Payer: Self-pay | Admitting: *Deleted

## 2017-07-07 ENCOUNTER — Encounter: Payer: Self-pay | Admitting: Obstetrics & Gynecology

## 2017-07-07 ENCOUNTER — Encounter: Payer: Self-pay | Admitting: *Deleted

## 2017-07-07 VITALS — BP 100/78 | HR 80 | Wt 150.5 lb

## 2017-07-07 DIAGNOSIS — Z9889 Other specified postprocedural states: Secondary | ICD-10-CM

## 2017-07-07 DIAGNOSIS — Z90710 Acquired absence of both cervix and uterus: Secondary | ICD-10-CM

## 2017-07-07 MED ORDER — METRONIDAZOLE 0.75 % VA GEL: VAGINAL | 0 refills | Status: DC

## 2017-07-07 NOTE — Progress Notes (Signed)
  HPI: Patient returns for routine postoperative follow-up having undergone abdominal supracervical hysterectomy with removal of the right tube and ovary and removal of the left fallopian tube on 06/03/2017.  The patient's immediate postoperative recovery has been unremarkable. Since hospital discharge the patient reports normal postoperative pain.   Current Outpatient Prescriptions: cetirizine (ZYRTEC) 10 MG tablet, Take 10 mg by mouth daily as needed for allergies. , Disp: , Rfl:  polyethylene glycol (MIRALAX / GLYCOLAX) packet, Take 17 g by mouth daily., Disp: 14 each, Rfl: 0 triamcinolone cream (KENALOG) 0.1 %, Apply 1 application topically 2 (two) times daily as needed (eczema). , Disp: , Rfl:  albuterol (PROVENTIL HFA;VENTOLIN HFA) 108 (90 Base) MCG/ACT inhaler, Inhale into the lungs every 6 (six) hours as needed for wheezing or shortness of breath., Disp: , Rfl:  ketorolac (TORADOL) 10 MG tablet, Take 1 tablet (10 mg total) by mouth every 8 (eight) hours as needed. (Patient not taking: Reported on 07/07/2017), Disp: 15 tablet, Rfl: 0 Multiple Vitamins-Minerals (MULTIVITAMIN ADULT PO), Take 1 tablet by mouth daily., Disp: , Rfl:  omeprazole (PRILOSEC) 20 MG capsule, Take 20 mg by mouth 2 (two) times daily before a meal., Disp: , Rfl:  ondansetron (ZOFRAN) 8 MG tablet, Take 1 tablet (8 mg total) by mouth every 6 (six) hours as needed for nausea. (Patient not taking: Reported on 06/11/2017), Disp: 20 tablet, Rfl: 0 oxyCODONE-acetaminophen (PERCOCET/ROXICET) 5-325 MG tablet, Take 1-2 tablets by mouth every 4 (four) hours as needed (moderate to severe pain (when tolerating fluids)). (Patient not taking: Reported on 06/11/2017), Disp: 40 tablet, Rfl: 0  No current facility-administered medications for this visit.     Blood pressure 100/78, pulse 80, weight 150 lb 8 oz (68.3 kg), last menstrual period 06/03/2017.  Physical Exam: Incision is clean dry and intact and abdominal exam is  benign Vaginally the cervix is normal  Diagnostic Tests:   Pathology: Benign fibroid uterus  Impression: Status post abdominal supracervical hysterectomy with removal of right tube and ovary and left fallopian tube, preservation of left ovary  Plan: Routine postoperative care  Follow up: Return in about 1 year (around 07/07/2018) for Follow up, with Dr Elonda Husky.   Florian Buff, MD

## 2017-07-09 ENCOUNTER — Encounter: Payer: BC Managed Care – PPO | Admitting: Obstetrics & Gynecology

## 2017-07-27 ENCOUNTER — Telehealth: Payer: Self-pay | Admitting: *Deleted

## 2017-07-27 NOTE — Telephone Encounter (Signed)
Informed patient per Dr Elonda Husky that is fine, she had a supracervical hysterectomy so it is not from a vaginal cuff just from the cervix, so no problem. Verbalized understanding.

## 2017-07-27 NOTE — Telephone Encounter (Signed)
That is fine, she had a supracervical hysterectomy so it is not from a vaginal cuff just from the cervix, so no problem

## 2017-07-27 NOTE — Telephone Encounter (Signed)
Patient called with complaints of dark brown discharge yesterday. She did have intercourse last night in which today she noticed some brighter red spotting after she used the restroom. No pain. Abd Hysterectomy on 7/25. Pt wanted me to make you aware. Please advise further if needed.

## 2017-08-24 ENCOUNTER — Ambulatory Visit (INDEPENDENT_AMBULATORY_CARE_PROVIDER_SITE_OTHER): Payer: BC Managed Care – PPO | Admitting: Obstetrics & Gynecology

## 2017-08-24 ENCOUNTER — Encounter: Payer: Self-pay | Admitting: Obstetrics & Gynecology

## 2017-08-24 VITALS — BP 138/90 | HR 73 | Ht 62.0 in | Wt 154.0 lb

## 2017-08-24 DIAGNOSIS — N760 Acute vaginitis: Secondary | ICD-10-CM

## 2017-08-24 DIAGNOSIS — B9689 Other specified bacterial agents as the cause of diseases classified elsewhere: Secondary | ICD-10-CM

## 2017-08-24 DIAGNOSIS — N72 Inflammatory disease of cervix uteri: Secondary | ICD-10-CM

## 2017-08-24 MED ORDER — METRONIDAZOLE 0.75 % VA GEL: VAGINAL | 0 refills | Status: DC

## 2017-08-24 MED ORDER — DOXYCYCLINE HYCLATE 100 MG PO TABS: 100.0000 mg | ORAL_TABLET | Freq: Two times a day (BID) | ORAL | 0 refills | Status: DC

## 2017-08-24 NOTE — Progress Notes (Signed)
Chief Complaint  Patient presents with  . having vaginal bleeding and discharge    has noticed an odor    Blood pressure 138/90, pulse 73, height 5\' 2"  (1.575 m), weight 154 lb (69.9 kg), last menstrual period 06/03/2017.  41 y.o. G0P0 Patient's last menstrual period was 06/03/2017. The current method of family planning is status post hysterectomy.  Subjective Vaginal discharge for 3weeks Itching yes Irritation yes Odor yes Similar to previous no Not associated with any other issues and she has not been on other antibiotics She has some spotting when she has the discharge She denies any pain  Previous treatment n/a  Past Medical History:  Diagnosis Date  . Anemia   . GERD (gastroesophageal reflux disease)   . Helicobacter pylori (H. pylori)    prevpac  . Hemorrhoids     Past Surgical History:  Procedure Laterality Date  . ABDOMINAL HYSTERECTOMY    . APPENDECTOMY     42 years old  . ESOPHAGOGASTRODUODENOSCOPY  09/14/2007   Dr. Hurley Cisco quadrant distal esophageal erosion consistant with  moderately severe erosive reflux esophagitits, o/w normal esophagus.,tiny antral erosions o/w normal stomach, inflammation on bx  . ESOPHAGOGASTRODUODENOSCOPY (EGD) WITH ESOPHAGEAL DILATION  12/09/2012   Dr. Gala Romney- normal esophagus s/p maloney dilation, small hiatal hernia  . SALPINGOOPHORECTOMY Right 06/03/2017   Procedure: SALPINGO OOPHORECTOMY;  Surgeon: Florian Buff, MD;  Location: [facility];  Service: Gynecology;  Laterality: Right;  . SUPRACERVICAL ABDOMINAL HYSTERECTOMY N/A 06/03/2017   Procedure: HYSTERECTOMY SUPRACERVICAL ABDOMINAL;  Surgeon: Florian Buff, MD;  Location: [facility];  Service: Gynecology;  Laterality: N/A;  . UNILATERAL SALPINGECTOMY Left 06/03/2017   Procedure: left salpingectomy;  Surgeon: Florian Buff, MD;  Location: [facility];  Service: Gynecology;  Laterality: Left;    OB History    Gravida Para Term Preterm AB Living             0   SAB TAB Ectopic  Multiple Live Births                  Allergies  Allergen Reactions  . Tamiflu [Oseltamivir Phosphate] Other (See Comments)    Anxiety, insomnia, shaky, vomiting  . Levofloxacin   . Sulfonamide Derivatives Hives    Social History   Social History  . Marital status: Single    Spouse name: N/A  . Number of children: N/A  . Years of education: N/A   Social History Main Topics  . Smoking status: Never Smoker  . Smokeless tobacco: Never Used  . Alcohol use No  . Drug use: No  . Sexual activity: Yes    Birth control/ protection: Surgical     Comment: hyst   Other Topics Concern  . None   Social History Narrative  . None    History reviewed. No pertinent family history.  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 2 drops of saline  were placed on a slide and the cotton swab was immersed in the saline. Microscopic evaluation was performed and results were as follows:  Negative  for yeast  positive for clue cells , consistent with Bacterial vaginosis Negative for trichomonas  Abnormal- very high population WBC population   Whiff test: Positive     Impression Diagnoses this Encounter::   ICD-10-CM   1. Bacterial vaginosis N76.0    B96.89   2. Cervicitis and endocervicitis N72     Established relevant diagnosis(es):   Plan/Recommendations: Meds ordered this encounter    Medications  . metroNIDAZOLE (METROGEL VAGINAL) 0.75 % vaginal gel    Sig: Nightly x 5 nights    Dispense:  70 g    Refill:  0  . doxycycline (VIBRA-TABS) 100 MG tablet    Sig: Take 1 tablet (100 mg total) by mouth 2 (two) times daily.    Dispense:  20 tablet    Refill:  0    Labs or Scans Ordered: No orders of the defined types were placed in this encounter.   Management:: this is a little bit unusual, Florencia has both a definitive bacterial vaginosis with lots of clue cells and also a very heavy white blood cell population or discharge No Trichomonas is seen  Is result I would treat her for both problems Metronidazole vaginally for the bacterial vaginosis Doxycycline 100 mg twice a day for the cervicitis and endocervicitis  I have told her not to have sex until I see her back which is to be on November 12 for a follow-up evaluation in wet prep   Follow up Return in about 4 weeks (around 09/21/2017) for 3:30 PM, Follow up, with Dr Elonda Husky.    All questions were answered.

## 2017-09-07 ENCOUNTER — Ambulatory Visit: Payer: BC Managed Care – PPO | Admitting: Obstetrics & Gynecology

## 2017-09-14 ENCOUNTER — Ambulatory Visit (INDEPENDENT_AMBULATORY_CARE_PROVIDER_SITE_OTHER): Payer: BC Managed Care – PPO | Admitting: Obstetrics and Gynecology

## 2017-09-14 ENCOUNTER — Encounter: Payer: Self-pay | Admitting: Obstetrics and Gynecology

## 2017-09-14 VITALS — BP 120/82 | HR 82 | Ht 62.0 in | Wt 155.8 lb

## 2017-09-14 DIAGNOSIS — B373 Candidiasis of vulva and vagina: Secondary | ICD-10-CM

## 2017-09-14 DIAGNOSIS — B3731 Acute candidiasis of vulva and vagina: Secondary | ICD-10-CM

## 2017-09-14 DIAGNOSIS — N898 Other specified noninflammatory disorders of vagina: Secondary | ICD-10-CM | POA: Diagnosis not present

## 2017-09-14 LAB — POCT WET PREP WITH KOH
TRICHOMONAS UA: NEGATIVE
YEAST WET PREP PER HPF POC: NEGATIVE

## 2017-09-14 NOTE — Progress Notes (Signed)
Patient ID: [name], female   DOB: 08/31/1976, 41 y.o.   MRN: [id number]   [facility] Visit  @DATE @            Patient name: [name] MRN [id number]  Date of birth: 10-Jan-1976  CC & HPI:  [name] is a 41 y.o. female presenting today for continued vaginal discharge and itching that started after her hysterectomy on 06/03/2017. She reports that her incision has been sore and painful. Pt was seen three times after her procedure by Dr. Elonda Husky, with the most recent visit on 08/24/2017. She was diagnosed with bacterial vaginosis, cervicitis and endocervicitis. She was prescribed Metrogel and Doxycycline. She continues to have symptoms and notes she thinks she may now have a yeast infection. She has been using vaginal monistat cream, last dose this morning. Pt denies any other symptoms or complaints at this time.  ROS:  ROS + vaginal itching + vaginal discharge + abdominal incision discomfort All systems are negative except as noted in the HPI and PMH.   Pertinent History Reviewed:   Reviewed: Medical         Past Medical History:  Diagnosis Date  . Anemia   . GERD (gastroesophageal reflux disease)   . Helicobacter pylori (H. pylori)    prevpac  . Hemorrhoids                               Surgical Hx:    Past Surgical History:  Procedure Laterality Date  . ABDOMINAL HYSTERECTOMY    . APPENDECTOMY     41 years old  . ESOPHAGOGASTRODUODENOSCOPY  09/14/2007   Dr. Hurley Cisco quadrant distal esophageal erosion consistant with  moderately severe erosive reflux esophagitits, o/w normal esophagus.,tiny antral erosions o/w normal stomach, inflammation on bx   Medications: Reviewed & Updated - see associated section                       Current Outpatient Medications:  .  albuterol (PROVENTIL HFA;VENTOLIN HFA) 108 (90 Base) MCG/ACT inhaler, Inhale into the lungs every 6 (six) hours as needed for wheezing or shortness of breath., Disp: , Rfl:  .   cetirizine (ZYRTEC) 10 MG tablet, Take 10 mg by mouth daily as needed for allergies. , Disp: , Rfl:  .  doxycycline (VIBRA-TABS) 100 MG tablet, Take 1 tablet (100 mg total) by mouth 2 (two) times daily., Disp: 20 tablet, Rfl: 0 .  metroNIDAZOLE (METROGEL VAGINAL) 0.75 % vaginal gel, Nightly x 5 nights, Disp: 70 g, Rfl: 0 .  Multiple Vitamins-Minerals (MULTIVITAMIN ADULT PO), Take 1 tablet by mouth as needed. , Disp: , Rfl:  .  omeprazole (PRILOSEC) 20 MG capsule, Take 20 mg by mouth 2 (two) times daily before a meal., Disp: , Rfl:  .  triamcinolone cream (KENALOG) 0.1 %, Apply 1 application topically 2 (two) times daily as needed (eczema). , Disp: , Rfl:    Social History: Reviewed -  reports that  has never smoked. she has never used smokeless tobacco.  Objective Findings:  Vitals: Blood pressure 120/82, pulse 82, height 5\' 2"  (1.575 m), weight 155 lb 12.8 oz (70.7 kg), last menstrual period 06/03/2017.  Physical Examination:  General appearance: alert, well appearing, and in no distress and oriented to person, place, and time Abdomen: incision well healed Pelvic:   Vulva: normal Vagina:  Cervix: normal mucous, mobile Uterus: surgically removed Wet  Prep: occasional clue, negative trichimosis, plenty epithelial cells, rare white cells  Assessment & Plan:   A:  1. Yeast Infection 2. 3. 4.   P:  1. Continue Monistat for 3-4 days 2. 3. 4.   By signing my name below, I, Margit Banda, attest that this documentation has been prepared under the direction and in the presence of Jonnie Kind, MD. Electronically Signed: Margit Banda, Medical Scribe. 09/14/17. 1:43 PM.  I personally performed the services described in this documentation, which was SCRIBED in my presence. The recorded information has been reviewed and considered accurate. It has been edited as necessary during review. Jonnie Kind, MD

## 2017-09-18 ENCOUNTER — Ambulatory Visit: Payer: BC Managed Care – PPO | Admitting: Obstetrics & Gynecology

## 2017-09-20 DIAGNOSIS — B373 Candidiasis of vulva and vagina: Secondary | ICD-10-CM | POA: Insufficient documentation

## 2017-09-20 DIAGNOSIS — B3731 Acute candidiasis of vulva and vagina: Secondary | ICD-10-CM | POA: Insufficient documentation

## 2017-09-22 ENCOUNTER — Ambulatory Visit: Payer: BC Managed Care – PPO | Admitting: Obstetrics & Gynecology

## 2017-10-13 ENCOUNTER — Ambulatory Visit (INDEPENDENT_AMBULATORY_CARE_PROVIDER_SITE_OTHER): Payer: BC Managed Care – PPO | Admitting: Obstetrics & Gynecology

## 2017-10-13 ENCOUNTER — Encounter: Payer: Self-pay | Admitting: Obstetrics & Gynecology

## 2017-10-13 VITALS — BP 130/80 | HR 84 | Ht 62.0 in | Wt 159.0 lb

## 2017-10-13 DIAGNOSIS — N898 Other specified noninflammatory disorders of vagina: Secondary | ICD-10-CM

## 2017-10-13 NOTE — Progress Notes (Signed)
Chief Complaint  Patient presents with  . Follow-up      41 y.o. G0P0 Patient's last menstrual period was 06/03/2017. The current method of family planning is status post hysterectomy.  Outpatient Encounter Medications as of 10/13/2017  Medication Sig Note  . albuterol (PROVENTIL HFA;VENTOLIN HFA) 108 (90 Base) MCG/ACT inhaler Inhale into the lungs every 6 (six) hours as needed for wheezing or shortness of breath.   . cetirizine (ZYRTEC) 10 MG tablet Take 10 mg by mouth daily as needed for allergies.    . Multiple Vitamins-Minerals (MULTIVITAMIN ADULT PO) Take 1 tablet by mouth as needed.    Marland Kitchen omeprazole (PRILOSEC) 20 MG capsule Take 20 mg as needed by mouth.  05/27/2017: Pt is not currently taking - but does not want me to remove  . triamcinolone cream (KENALOG) 0.1 % Apply 1 application topically 2 (two) times daily as needed (eczema).     No facility-administered encounter medications on file as of 10/13/2017.     Subjective [name] is in for recheck after being treated for BV then yeast She has resolved symptoms of both now No complaints except for some occasional discharge Past Medical History:  Diagnosis Date  . Anemia   . GERD (gastroesophageal reflux disease)   . Helicobacter pylori (H. pylori)    prevpac  . Hemorrhoids     Past Surgical History:  Procedure Laterality Date  . ABDOMINAL HYSTERECTOMY    . APPENDECTOMY     41 years old  . ESOPHAGOGASTRODUODENOSCOPY  09/14/2007   Dr. Hurley Cisco quadrant distal esophageal erosion consistant with  moderately severe erosive reflux esophagitits, o/w normal esophagus.,tiny antral erosions o/w normal stomach, inflammation on bx  . ESOPHAGOGASTRODUODENOSCOPY (EGD) WITH ESOPHAGEAL DILATION  12/09/2012   Dr. Gala Romney- normal esophagus s/p maloney dilation, small hiatal hernia  . SALPINGOOPHORECTOMY Right 06/03/2017   Procedure: SALPINGO OOPHORECTOMY;  Surgeon: Florian Buff, MD;  Location: [facility];  Service:  Gynecology;  Laterality: Right;  . SUPRACERVICAL ABDOMINAL HYSTERECTOMY N/A 06/03/2017   Procedure: HYSTERECTOMY SUPRACERVICAL ABDOMINAL;  Surgeon: Florian Buff, MD;  Location: [facility];  Service: Gynecology;  Laterality: N/A;  . UNILATERAL SALPINGECTOMY Left 06/03/2017   Procedure: left salpingectomy;  Surgeon: Florian Buff, MD;  Location: [facility];  Service: Gynecology;  Laterality: Left;    OB History    Gravida Para Term Preterm AB Living             0   SAB TAB Ectopic Multiple Live Births                  Allergies  Allergen Reactions  . Tamiflu [Oseltamivir Phosphate] Other (See Comments)    Anxiety, insomnia, shaky, vomiting  . Levofloxacin   . Sulfonamide Derivatives Hives    Social History   Socioeconomic History  . Marital status: Single    Spouse name: None  . Number of children: None  . Years of education: None  . Highest education level: None  Social Needs  . Financial resource strain: None  . Food insecurity - worry: None  . Food insecurity - inability: None  . Transportation needs - medical: None  . Transportation needs - non-medical: None  Occupational History  . None  Tobacco Use  . Smoking status: Never Smoker  . Smokeless tobacco: Never Used  Substance and Sexual Activity  . Alcohol use: No  . Drug use: No  . Sexual activity: Yes    Birth control/protection: Surgical  Comment: hyst  Other Topics Concern  . None  Social History Narrative  . None    History reviewed. No pertinent family history.  Medications:       Current Outpatient Medications:  .  albuterol (PROVENTIL HFA;VENTOLIN HFA) 108 (90 Base) MCG/ACT inhaler, Inhale into the lungs every 6 (six) hours as needed for wheezing or shortness of breath., Disp: , Rfl:  .  cetirizine (ZYRTEC) 10 MG tablet, Take 10 mg by mouth daily as needed for allergies. , Disp: , Rfl:  .  Multiple Vitamins-Minerals (MULTIVITAMIN ADULT PO), Take 1 tablet by mouth as needed. , Disp: , Rfl:  .   omeprazole (PRILOSEC) 20 MG capsule, Take 20 mg as needed by mouth. , Disp: , Rfl:  .  triamcinolone cream (KENALOG) 0.1 %, Apply 1 application topically 2 (two) times daily as needed (eczema). , Disp: , Rfl:   Objective Blood pressure 130/80, pulse 84, height 5\' 2"  (1.575 m), weight 159 lb (72.1 kg), last menstrual period 06/03/2017.  Vulva exam normal Vagina no discharge Cuff intact no discharge  Pertinent ROS No burning with urination, frequency or urgency No nausea, vomiting or diarrhea Nor fever chills or other constitutional symptoms   Labs or studies No new    Impression Diagnoses this Encounter::   ICD-10-CM   1. Vaginal discharge N89.8    resolved    Established relevant diagnosis(es):   Plan/Recommendations: No orders of the defined types were placed in this encounter.   Labs or Scans Ordered: No orders of the defined types were placed in this encounter.   Management:: Normal findings No further therapy needed  Follow up Return if symptoms worsen or fail to improve, for yearly, with Dr Elonda Husky.     All questions were answered.

## 2019-01-10 NOTE — Telephone Encounter (Signed)
Note sent to nurse. 

## 2019-07-14 ENCOUNTER — Other Ambulatory Visit: Payer: Self-pay

## 2019-07-14 ENCOUNTER — Emergency Department (HOSPITAL_COMMUNITY)
Admission: EM | Admit: 2019-07-14 | Discharge: 2019-07-14 | Disposition: A | Discharge status: Home / Self Care | Payer: BC Managed Care – PPO | Attending: Emergency Medicine | Admitting: Emergency Medicine

## 2019-07-14 ENCOUNTER — Encounter (HOSPITAL_COMMUNITY): Payer: Self-pay | Admitting: Emergency Medicine

## 2019-07-14 DIAGNOSIS — Y999 Unspecified external cause status: Secondary | ICD-10-CM | POA: Diagnosis not present

## 2019-07-14 DIAGNOSIS — X58XXXA Exposure to other specified factors, initial encounter: Secondary | ICD-10-CM | POA: Diagnosis not present

## 2019-07-14 DIAGNOSIS — Y939 Activity, unspecified: Secondary | ICD-10-CM | POA: Diagnosis not present

## 2019-07-14 DIAGNOSIS — H5711 Ocular pain, right eye: Secondary | ICD-10-CM | POA: Diagnosis present

## 2019-07-14 DIAGNOSIS — T1591XA Foreign body on external eye, part unspecified, right eye, initial encounter: Secondary | ICD-10-CM | POA: Insufficient documentation

## 2019-07-14 DIAGNOSIS — Y929 Unspecified place or not applicable: Secondary | ICD-10-CM | POA: Diagnosis not present

## 2019-07-14 MED ORDER — TETRACAINE HCL 0.5 % OP SOLN
2.0000 [drp] | Freq: Once | OPHTHALMIC | Status: AC
  Administered 2019-07-14: 21:00:00 2 [drp] via OPHTHALMIC
  Filled 2019-07-14: qty 4

## 2019-07-14 MED ORDER — FLUORESCEIN SODIUM 1 MG OP STRP
1.0000 | ORAL_STRIP | Freq: Once | OPHTHALMIC | Status: AC
  Administered 2019-07-14: 21:00:00 1 via OPHTHALMIC
  Filled 2019-07-14: qty 1

## 2019-07-14 MED ORDER — KETOROLAC TROMETHAMINE 0.5 % OP SOLN
1.0000 [drp] | Freq: Four times a day (QID) | OPHTHALMIC | Status: DC
  Filled 2019-07-14: qty 5

## 2019-07-14 NOTE — ED Triage Notes (Signed)
Mowing grass last night and object flew  In right eye.  Right eye is red.

## 2019-07-14 NOTE — ED Provider Notes (Signed)
[facility] EMERGENCY DEPARTMENT Provider Note   CSN: [id number] Arrival date & time: 07/14/19  1725     History   Chief Complaint Chief Complaint  Patient presents with  . Foreign Body in [place]    right    HPI Kit A Staunton is a 43 y.o. female with a history of anemia and GERD, presenting with right thigh pain after she felt a foreign body fly in her eye yesterday as she was using a manual hedge clippers to trim around her home.  She reports swelling and irritation at her right medial eye corner which has become increasingly erythematous.  She denies drainage from the eye and has had no visual changes.  She has flush the eye without improvement in symptoms.  She denies headaches, fevers or chills, denies photophobia or blurred vision.     The history is provided by the patient.    Past Medical History:  Diagnosis Date  . Anemia   . GERD (gastroesophageal reflux disease)   . Helicobacter pylori (H. pylori)    prevpac  . Hemorrhoids     Patient Active Problem List   Diagnosis Date Noted  . Monilial vaginitis 09/20/2017  . S/P abdominal supracervical subtotal hysterectomy 06/03/2017  . GERD 03/07/2010  . EPIGASTRIC PAIN 03/07/2010  . HELICOBACTER PYLORI INFECTION, HX OF 03/07/2010    Past Surgical History:  Procedure Laterality Date  . ABDOMINAL HYSTERECTOMY    . APPENDECTOMY     43 years old  . ESOPHAGOGASTRODUODENOSCOPY  09/14/2007   Dr. Hurley Cisco quadrant distal esophageal erosion consistant with  moderately severe erosive reflux esophagitits, o/w normal esophagus.,tiny antral erosions o/w normal stomach, inflammation on bx  . ESOPHAGOGASTRODUODENOSCOPY (EGD) WITH ESOPHAGEAL DILATION  12/09/2012   Dr. Gala Romney- normal esophagus s/p maloney dilation, small hiatal hernia  . SALPINGOOPHORECTOMY Right 06/03/2017   Procedure: SALPINGO OOPHORECTOMY;  Surgeon: Florian Buff, MD;  Location: [facility];  Service: Gynecology;  Laterality: Right;  . SUPRACERVICAL ABDOMINAL  HYSTERECTOMY N/A 06/03/2017   Procedure: HYSTERECTOMY SUPRACERVICAL ABDOMINAL;  Surgeon: Florian Buff, MD;  Location: [facility];  Service: Gynecology;  Laterality: N/A;  . UNILATERAL SALPINGECTOMY Left 06/03/2017   Procedure: left salpingectomy;  Surgeon: Florian Buff, MD;  Location: [facility];  Service: Gynecology;  Laterality: Left;     OB History    Gravida      Para      Term      Preterm      AB      Living  0     SAB      TAB      Ectopic      Multiple      Live Births               Home Medications    Prior to Admission medications   Medication Sig Start Date End Date Taking? Authorizing Provider  albuterol (PROVENTIL HFA;VENTOLIN HFA) 108 (90 Base) MCG/ACT inhaler Inhale into the lungs every 6 (six) hours as needed for wheezing or shortness of breath.    [provider]  cetirizine (ZYRTEC) 10 MG tablet Take 10 mg by mouth daily as needed for allergies.     [provider]  Multiple Vitamins-Minerals (MULTIVITAMIN ADULT PO) Take 1 tablet by mouth as needed.     [provider]  omeprazole (PRILOSEC) 20 MG capsule Take 20 mg as needed by mouth.     [provider]  triamcinolone cream (KENALOG) 0.1 % Apply 1  application topically 2 (two) times daily as needed (eczema).     [provider]    Family History History reviewed. No pertinent family history.  Social History Social History   Tobacco Use  . Smoking status: Never Smoker  . Smokeless tobacco: Never Used  Substance Use Topics  . Alcohol use: No  . Drug use: No     Allergies   Tamiflu [oseltamivir phosphate], Levofloxacin, and Sulfonamide derivatives   Review of Systems Review of Systems  Constitutional: Negative for fever.  HENT: Negative for congestion and sore throat.   Eyes: Positive for pain and redness. Negative for photophobia, discharge, itching and visual disturbance.  Respiratory: Negative.   Cardiovascular: Negative.    Gastrointestinal: Negative.   Skin: Negative.  Negative for rash and wound.  Neurological: Negative for dizziness, weakness, light-headedness, numbness and headaches.  Psychiatric/Behavioral: Negative.   All other systems reviewed and are negative.    Physical Exam Updated Vital Signs BP 123/79 (BP Location: Right Arm)   Pulse 68   Temp 98.5 F (36.9 C)   Resp 16   Ht 5\' 2"  (1.575 m)   Wt 60.3 kg   LMP 06/03/2017   SpO2 100%   BMI 24.33 kg/m   Physical Exam Vitals signs and nursing note reviewed.  Constitutional:      Appearance: She is well-developed.  HENT:     Head: Normocephalic and atraumatic.     Nose: Nose normal.  Eyes:     General: Lids are normal.        Right eye: Foreign body present.     Conjunctiva/sclera:     Right eye: Right conjunctiva is injected.     Pupils: Pupils are equal, round, and reactive to light.     Right eye: No corneal abrasion or fluorescein uptake.     Slit lamp exam:    Right eye: No corneal ulcer.     Comments: Visual Acuity R Distance: 20/20 L Distance: 20/20  Mild chemosis right medial canthus.    Neck:     Musculoskeletal: Normal range of motion.  Cardiovascular:     Rate and Rhythm: Normal rate.  Pulmonary:     Effort: Pulmonary effort is normal.  Skin:    General: Skin is warm and dry.  Neurological:     Mental Status: She is alert.      ED Treatments / Results  Labs (all labs ordered are listed, but only abnormal results are displayed) Labs Reviewed - No data to display  EKG None  Radiology No results found.  Procedures .Foreign Body Removal  Date/Time: 07/14/2019 8:55 PM Performed by: Evalee Jefferson, PA-C Authorized by: Evalee Jefferson, PA-C  Consent: Verbal consent obtained. Consent given by: patient Patient identity confirmed: verbally with patient Time out: Immediately prior to procedure a "time out" was called to verify the correct patient, procedure, equipment, support staff and site/side marked as  required. Body area: eye Location details: right conjunctiva Localization method: visualized Removal mechanism: moist cotton swab Eye examined with fluorescein. No fluorescein uptake. Depth: superficial Complexity: simple 1 objects recovered. Objects recovered: organic fb, ? grass remnant Post-procedure assessment: foreign body removed Comments: Foreign body at right medial canthus.  Easily removed using cotton swab.     (including critical care time)  Medications Ordered in ED Medications  tetracaine (PONTOCAINE) 0.5 % ophthalmic solution 2 drop (has no administration in time range)  ketorolac (ACULAR) 0.5 % ophthalmic solution 1 drop (has no administration in time range)  fluorescein  ophthalmic strip 1 strip (1 strip Right Eye Given 07/14/19 2047)     Initial Impression / Assessment and Plan / ED Course  I have reviewed the triage vital signs and the nursing notes.  Pertinent labs & imaging results that were available during my care of the patient were reviewed by me and considered in my medical decision making (see chart for details).        Pt with simple foreign body medial canthus with residual erythema and chemosis.  No fluorescein uptake.  She was given ketorolac for home use for inflammation.  She has care with Dr. Gershon Crane (does not wear contacts). Prn f/u with him if sx persist or worsen.  Pt sx free at time of dc.   Final Clinical Impressions(s) / ED Diagnoses   Final diagnoses:  Eye foreign body, right, initial encounter    ED Discharge Orders    None       Landis Martins 07/14/19 2059    Hayden Rasmussen, MD 07/15/19 1050

## 2019-07-14 NOTE — Discharge Instructions (Addendum)
You may apply one drop of the medicine given in your right eye every 4 hours if needed for inflammation or pain.  Avoid rubbing your eye.  Applying a cool compress can also be soothing.

## 2019-08-01 ENCOUNTER — Other Ambulatory Visit (HOSPITAL_COMMUNITY): Payer: Self-pay | Admitting: Family Medicine

## 2019-08-01 DIAGNOSIS — Z1231 Encounter for screening mammogram for malignant neoplasm of breast: Secondary | ICD-10-CM

## 2019-08-03 ENCOUNTER — Ambulatory Visit (HOSPITAL_COMMUNITY)
Admission: RE | Admit: 2019-08-03 | Discharge: 2019-08-03 | Disposition: A | Discharge status: Home / Self Care | Payer: BC Managed Care – PPO | Source: Ambulatory Visit | Attending: Family Medicine | Admitting: Family Medicine

## 2019-08-03 ENCOUNTER — Other Ambulatory Visit: Payer: Self-pay

## 2019-08-03 DIAGNOSIS — Z1231 Encounter for screening mammogram for malignant neoplasm of breast: Secondary | ICD-10-CM | POA: Diagnosis present

## 2019-08-08 ENCOUNTER — Other Ambulatory Visit (HOSPITAL_COMMUNITY): Payer: Self-pay | Admitting: Family Medicine

## 2019-08-08 DIAGNOSIS — R928 Other abnormal and inconclusive findings on diagnostic imaging of breast: Secondary | ICD-10-CM

## 2019-08-17 ENCOUNTER — Other Ambulatory Visit (HOSPITAL_COMMUNITY): Payer: Self-pay | Admitting: Family Medicine

## 2019-08-17 ENCOUNTER — Ambulatory Visit (HOSPITAL_COMMUNITY): Admission: RE | Admit: 2019-08-17 | Payer: BC Managed Care – PPO | Source: Ambulatory Visit

## 2019-08-17 ENCOUNTER — Ambulatory Visit (HOSPITAL_COMMUNITY)
Admission: RE | Admit: 2019-08-17 | Discharge: 2019-08-17 | Disposition: A | Discharge status: Home / Self Care | Payer: BC Managed Care – PPO | Source: Ambulatory Visit | Attending: Family Medicine | Admitting: Family Medicine

## 2019-08-17 ENCOUNTER — Other Ambulatory Visit: Payer: Self-pay

## 2019-08-17 DIAGNOSIS — R928 Other abnormal and inconclusive findings on diagnostic imaging of breast: Secondary | ICD-10-CM | POA: Diagnosis present

## 2019-10-25 ENCOUNTER — Other Ambulatory Visit: Payer: Self-pay

## 2019-10-25 ENCOUNTER — Ambulatory Visit: Payer: BC Managed Care – PPO | Attending: Internal Medicine

## 2019-10-25 DIAGNOSIS — Z20828 Contact with and (suspected) exposure to other viral communicable diseases: Secondary | ICD-10-CM | POA: Insufficient documentation

## 2019-10-25 DIAGNOSIS — Z20822 Contact with and (suspected) exposure to covid-19: Secondary | ICD-10-CM

## 2019-10-26 LAB — NOVEL CORONAVIRUS, NAA: SARS-CoV-2, NAA: NOT DETECTED

## 2019-10-27 NOTE — Progress Notes (Signed)
Order(s) created erroneously. Erroneous order ID: [id number]  Order moved by: Brigitte Pulse  Order move date/time: 10/27/2019 11:23 AM  Source Patient: [id number]  Source Contact: 10/25/2019  Destination Patient: [id number]  Destination Contact: 10/25/2019

## 2019-10-27 NOTE — Progress Notes (Signed)
Moving orders to this encounter.  

## 2019-10-27 NOTE — Progress Notes (Signed)
Order(s) created erroneously. Erroneous order ID: [id number]  Order moved by: Brigitte Pulse  Order move date/time: 10/27/2019 11:23 AM  Source Patient: [id number]  Source Contact: 10/25/2019  Destination Patient: [id number]  Destination Contact: 10/25/2019

## 2019-12-28 ENCOUNTER — Other Ambulatory Visit: Payer: BC Managed Care – PPO

## 2020-05-03 ENCOUNTER — Emergency Department (HOSPITAL_COMMUNITY): Payer: BC Managed Care – PPO

## 2020-05-03 ENCOUNTER — Observation Stay (HOSPITAL_BASED_OUTPATIENT_CLINIC_OR_DEPARTMENT_OTHER): Payer: BC Managed Care – PPO

## 2020-05-03 ENCOUNTER — Observation Stay (HOSPITAL_COMMUNITY): Payer: BC Managed Care – PPO

## 2020-05-03 ENCOUNTER — Observation Stay (HOSPITAL_COMMUNITY)
Admission: EM | Admit: 2020-05-03 | Discharge: 2020-05-03 | Disposition: A | Discharge status: Home / Self Care | Payer: BC Managed Care – PPO | Attending: Family Medicine | Admitting: Family Medicine

## 2020-05-03 ENCOUNTER — Other Ambulatory Visit: Payer: Self-pay

## 2020-05-03 ENCOUNTER — Encounter (HOSPITAL_COMMUNITY): Payer: Self-pay | Admitting: Emergency Medicine

## 2020-05-03 DIAGNOSIS — Z881 Allergy status to other antibiotic agents status: Secondary | ICD-10-CM | POA: Diagnosis not present

## 2020-05-03 DIAGNOSIS — R55 Syncope and collapse: Secondary | ICD-10-CM

## 2020-05-03 DIAGNOSIS — R42 Dizziness and giddiness: Secondary | ICD-10-CM | POA: Diagnosis present

## 2020-05-03 DIAGNOSIS — R2681 Unsteadiness on feet: Secondary | ICD-10-CM

## 2020-05-03 DIAGNOSIS — R112 Nausea with vomiting, unspecified: Secondary | ICD-10-CM

## 2020-05-03 DIAGNOSIS — H9319 Tinnitus, unspecified ear: Secondary | ICD-10-CM | POA: Diagnosis not present

## 2020-05-03 DIAGNOSIS — Z79899 Other long term (current) drug therapy: Secondary | ICD-10-CM | POA: Insufficient documentation

## 2020-05-03 DIAGNOSIS — Z882 Allergy status to sulfonamides status: Secondary | ICD-10-CM | POA: Diagnosis not present

## 2020-05-03 DIAGNOSIS — Z20822 Contact with and (suspected) exposure to covid-19: Secondary | ICD-10-CM | POA: Diagnosis not present

## 2020-05-03 DIAGNOSIS — K219 Gastro-esophageal reflux disease without esophagitis: Secondary | ICD-10-CM | POA: Insufficient documentation

## 2020-05-03 LAB — CBC WITH DIFFERENTIAL/PLATELET
Abs Immature Granulocytes: 0.03 10*3/uL (ref 0.00–0.07)
Basophils Absolute: 0.1 10*3/uL (ref 0.0–0.1)
Basophils Relative: 1 %
Eosinophils Absolute: 0.2 10*3/uL (ref 0.0–0.5)
Eosinophils Relative: 2 %
HCT: 40.4 % (ref 36.0–46.0)
Hemoglobin: 13.1 g/dL (ref 12.0–15.0)
Immature Granulocytes: 0 %
Lymphocytes Relative: 12 %
Lymphs Abs: 1.4 10*3/uL (ref 0.7–4.0)
MCH: 28.7 pg (ref 26.0–34.0)
MCHC: 32.4 g/dL (ref 30.0–36.0)
MCV: 88.4 fL (ref 80.0–100.0)
Monocytes Absolute: 0.5 10*3/uL (ref 0.1–1.0)
Monocytes Relative: 5 %
Neutro Abs: 9.2 10*3/uL — ABNORMAL HIGH (ref 1.7–7.7)
Neutrophils Relative %: 80 %
Platelets: 411 10*3/uL — ABNORMAL HIGH (ref 150–400)
RBC: 4.57 MIL/uL (ref 3.87–5.11)
RDW: 12.9 % (ref 11.5–15.5)
WBC: 11.4 10*3/uL — ABNORMAL HIGH (ref 4.0–10.5)
nRBC: 0 % (ref 0.0–0.2)

## 2020-05-03 LAB — URINALYSIS, ROUTINE W REFLEX MICROSCOPIC
Bacteria, UA: NONE SEEN
Bilirubin Urine: NEGATIVE
Glucose, UA: NEGATIVE mg/dL
Hgb urine dipstick: NEGATIVE
Ketones, ur: NEGATIVE mg/dL
Nitrite: NEGATIVE
Protein, ur: NEGATIVE mg/dL
Specific Gravity, Urine: 1.026 (ref 1.005–1.030)
pH: 7 (ref 5.0–8.0)

## 2020-05-03 LAB — BASIC METABOLIC PANEL
Anion gap: 9 (ref 5–15)
BUN: 7 mg/dL (ref 6–20)
CO2: 26 mmol/L (ref 22–32)
Calcium: 9.4 mg/dL (ref 8.9–10.3)
Chloride: 103 mmol/L (ref 98–111)
Creatinine, Ser: 0.68 mg/dL (ref 0.44–1.00)
GFR calc Af Amer: >60 mL/min (ref >60)
GFR calc non Af Amer: >60 mL/min (ref >60)
Glucose, Bld: 118 mg/dL — ABNORMAL HIGH (ref 70–99)
Potassium: 3.5 mmol/L (ref 3.5–5.1)
Sodium: 138 mmol/L (ref 135–145)

## 2020-05-03 LAB — RAPID URINE DRUG SCREEN, HOSP PERFORMED
Amphetamines: NOT DETECTED
Barbiturates: NOT DETECTED
Benzodiazepines: NOT DETECTED
Cocaine: NOT DETECTED
Opiates: NOT DETECTED
Tetrahydrocannabinol: NOT DETECTED

## 2020-05-03 LAB — PROTIME-INR
INR: 1 (ref 0.8–1.2)
Prothrombin Time: 12.7 seconds (ref 11.4–15.2)

## 2020-05-03 LAB — CBC
HCT: 37.1 % (ref 36.0–46.0)
Hemoglobin: 12.2 g/dL (ref 12.0–15.0)
MCH: 29 pg (ref 26.0–34.0)
MCHC: 32.9 g/dL (ref 30.0–36.0)
MCV: 88.3 fL (ref 80.0–100.0)
Platelets: 349 10*3/uL (ref 150–400)
RBC: 4.2 MIL/uL (ref 3.87–5.11)
RDW: 13 % (ref 11.5–15.5)
WBC: 10.9 10*3/uL — ABNORMAL HIGH (ref 4.0–10.5)
nRBC: 0 % (ref 0.0–0.2)

## 2020-05-03 LAB — ECHOCARDIOGRAM COMPLETE
Height: 62 in
Weight: 2160 oz

## 2020-05-03 LAB — COMPREHENSIVE METABOLIC PANEL
ALT: 13 U/L (ref 0–44)
AST: 15 U/L (ref 15–41)
Albumin: 3.6 g/dL (ref 3.5–5.0)
Alkaline Phosphatase: 92 U/L (ref 38–126)
Anion gap: 9 (ref 5–15)
BUN: 6 mg/dL (ref 6–20)
CO2: 21 mmol/L — ABNORMAL LOW (ref 22–32)
Calcium: 8.6 mg/dL — ABNORMAL LOW (ref 8.9–10.3)
Chloride: 107 mmol/L (ref 98–111)
Creatinine, Ser: 0.57 mg/dL (ref 0.44–1.00)
GFR calc Af Amer: >60 mL/min (ref >60)
GFR calc non Af Amer: >60 mL/min (ref >60)
Glucose, Bld: 118 mg/dL — ABNORMAL HIGH (ref 70–99)
Potassium: 3.8 mmol/L (ref 3.5–5.1)
Sodium: 137 mmol/L (ref 135–145)
Total Bilirubin: 0.3 mg/dL (ref 0.3–1.2)
Total Protein: 7.2 g/dL (ref 6.5–8.1)

## 2020-05-03 LAB — I-STAT CHEM 8, ED
BUN: 6 mg/dL (ref 6–20)
Calcium, Ion: 1.29 mmol/L (ref 1.15–1.40)
Chloride: 103 mmol/L (ref 98–111)
Creatinine, Ser: 0.6 mg/dL (ref 0.44–1.00)
Glucose, Bld: 119 mg/dL — ABNORMAL HIGH (ref 70–99)
HCT: 39 % (ref 36.0–46.0)
Hemoglobin: 13.3 g/dL (ref 12.0–15.0)
Potassium: 3.7 mmol/L (ref 3.5–5.1)
Sodium: 141 mmol/L (ref 135–145)
TCO2: 27 mmol/L (ref 22–32)

## 2020-05-03 LAB — HIV ANTIBODY (ROUTINE TESTING W REFLEX): HIV Screen 4th Generation wRfx: NONREACTIVE

## 2020-05-03 LAB — HCG, SERUM, QUALITATIVE: Preg, Serum: NEGATIVE

## 2020-05-03 LAB — ETHANOL: Alcohol, Ethyl (B): <10 mg/dL (ref <10)

## 2020-05-03 LAB — TROPONIN I (HIGH SENSITIVITY)
Troponin I (High Sensitivity): <2 ng/L (ref <18)
Troponin I (High Sensitivity): <2 ng/L (ref <18)

## 2020-05-03 LAB — POC URINE PREG, ED: Preg Test, Ur: NEGATIVE

## 2020-05-03 LAB — CBG MONITORING, ED: Glucose-Capillary: 95 mg/dL (ref 70–99)

## 2020-05-03 LAB — SARS CORONAVIRUS 2 BY RT PCR (HOSPITAL ORDER, PERFORMED IN ~~LOC~~ HOSPITAL LAB): SARS Coronavirus 2: NEGATIVE

## 2020-05-03 LAB — APTT: aPTT: 29 seconds (ref 24–36)

## 2020-05-03 MED ORDER — SODIUM CHLORIDE 0.9 % IV SOLN: INTRAVENOUS | Status: DC

## 2020-05-03 MED ORDER — ACETAMINOPHEN 325 MG PO TABS: 650.0000 mg | ORAL_TABLET | Freq: Four times a day (QID) | ORAL | Status: DC | PRN

## 2020-05-03 MED ORDER — ENOXAPARIN SODIUM 40 MG/0.4ML ~~LOC~~ SOLN: 40.0000 mg | SUBCUTANEOUS | Status: DC

## 2020-05-03 MED ORDER — ONDANSETRON HCL 4 MG/2ML IJ SOLN
4.0000 mg | Freq: Once | INTRAMUSCULAR | Status: AC
  Administered 2020-05-03: 4 mg via INTRAVENOUS
  Filled 2020-05-03: qty 2

## 2020-05-03 MED ORDER — ASPIRIN 81 MG PO CHEW
324.0000 mg | CHEWABLE_TABLET | Freq: Once | ORAL | Status: AC
  Administered 2020-05-03: 324 mg via ORAL
  Filled 2020-05-03: qty 4

## 2020-05-03 MED ORDER — ACETAMINOPHEN 650 MG RE SUPP: 650.0000 mg | Freq: Four times a day (QID) | RECTAL | Status: DC | PRN

## 2020-05-03 MED ORDER — ONDANSETRON HCL 4 MG PO TABS: 4.0000 mg | ORAL_TABLET | Freq: Four times a day (QID) | ORAL | Status: DC | PRN

## 2020-05-03 MED ORDER — MECLIZINE HCL 12.5 MG PO TABS: 25.0000 mg | ORAL_TABLET | Freq: Three times a day (TID) | ORAL | Status: DC | PRN

## 2020-05-03 MED ORDER — MECLIZINE HCL 25 MG PO TABS: 25.0000 mg | ORAL_TABLET | Freq: Three times a day (TID) | ORAL | 0 refills | Status: DC | PRN

## 2020-05-03 MED ORDER — LORAZEPAM 1 MG PO TABS
1.0000 mg | ORAL_TABLET | Freq: Once | ORAL | Status: DC
  Filled 2020-05-03 (×2): qty 1

## 2020-05-03 MED ORDER — SODIUM CHLORIDE 0.9 % IV BOLUS
1000.0000 mL | Freq: Once | INTRAVENOUS | Status: AC
  Administered 2020-05-03: 1000 mL via INTRAVENOUS

## 2020-05-03 MED ORDER — IOHEXOL 350 MG/ML SOLN
100.0000 mL | Freq: Once | INTRAVENOUS | Status: AC | PRN
  Administered 2020-05-03: 100 mL via INTRAVENOUS

## 2020-05-03 MED ORDER — ONDANSETRON HCL 4 MG/2ML IJ SOLN: 4.0000 mg | Freq: Four times a day (QID) | INTRAMUSCULAR | Status: DC | PRN

## 2020-05-03 MED ORDER — MECLIZINE HCL 12.5 MG PO TABS
25.0000 mg | ORAL_TABLET | Freq: Once | ORAL | Status: AC
  Administered 2020-05-03: 25 mg via ORAL
  Filled 2020-05-03: qty 2

## 2020-05-03 NOTE — Evaluation (Signed)
Physical Therapy Evaluation Patient Details Name: [name] MRN: [id number] DOB: Mar 29, 1976 Today's Date: 05/03/2020   History of Present Illness  [name]  is a 44 y.o. female, with no significant medical problem came to ED when patient had episode of lightheadedness while she was driving back home around 10:15 PM. Patient said that she had a sudden onset of lightheadedness along with nausea. Denies room spinning. Never had episode like this before. Patient had vomiting when she arrived in the ED. Denies blurred vision. Denies weakness of extremities. Denies numbness. Denies any vision changes. No slurred speech.    Clinical Impression  Patient functioning near baseline for functional mobility and gait other than slightly unsteady during ambulation without loss of balance.  Vestibular assessment for BPPV unable to be properly performed in the setting of patient receiving dizziness medication (meclizine).  Plan: recommend patient follow up in OP or home health setting for vestibular assessment for BPPV provided patient being of dizziness medication for at least 3-4 days prior to appointment.  This info explained to patient with understanding acknowledged.  Plan:  Patient discharged from physical therapy to care of nursing for ambulation daily as tolerated for length of stay.     Follow Up Recommendations Outpatient PT    Equipment Recommendations  None recommended by PT    Recommendations for Other Services       Precautions / Restrictions Precautions Precautions: None Restrictions Weight Bearing Restrictions: No      Mobility  Bed Mobility Overal bed mobility: Independent                Transfers Overall transfer level: Modified independent Equipment used: None                Ambulation/Gait Ambulation/Gait assistance: Modified independent (Device/Increase time) Gait Distance (Feet): 200 Feet Assistive device: None Gait Pattern/deviations:  WFL(Within Functional Limits) Gait velocity: decreased   General Gait Details: grossly WFL demonstrating slightly labored cadence without loss of balance  Stairs            Wheelchair Mobility    Modified Rankin (Stroke Patients Only)       Balance Overall balance assessment: No apparent balance deficits (not formally assessed)                                           Pertinent Vitals/Pain Pain Assessment: No/denies pain    Home Living Family/patient expects to be discharged to:: Private residence Living Arrangements: Alone Available Help at Discharge: Family;Available 24 hours/day Type of Home: House Home Access: Stairs to enter Entrance Stairs-Rails: None Entrance Stairs-Number of Steps: 1 Home Layout: Two level;Able to live on main level with bedroom/bathroom Home Equipment: None      Prior Function Level of Independence: Independent         Comments: Hydrographic surveyor, drives, works as a Surveyor, quantity Extremity Assessment Upper Extremity Assessment: Overall WFL for tasks assessed    Lower Extremity Assessment Lower Extremity Assessment: Overall WFL for tasks assessed    Cervical / Trunk Assessment Cervical / Trunk Assessment: Normal  Communication   Communication: No difficulties  Cognition Arousal/Alertness: Awake/alert Behavior During Therapy: WFL for tasks assessed/performed Overall Cognitive Status: Within Functional Limits for tasks assessed  General Comments      Exercises     Assessment/Plan    PT Assessment All further PT needs can be met in the next venue of care  PT Problem List Decreased activity tolerance;Decreased mobility;Other (comment) (dizziness)       PT Treatment Interventions      PT Goals (Current goals can be found in the Care Plan section)  Acute Rehab PT Goals Patient Stated  Goal: return home without dizziness PT Goal Formulation: With patient Time For Goal Achievement: 05/03/20 Potential to Achieve Goals: Good    Frequency     Barriers to discharge        Co-evaluation               AM-PAC PT "6 Clicks" Mobility  Outcome Measure Help needed turning from your back to your side while in a flat bed without using bedrails?: None Help needed moving from lying on your back to sitting on the side of a flat bed without using bedrails?: None Help needed moving to and from a bed to a chair (including a wheelchair)?: None Help needed standing up from a chair using your arms (e.g., wheelchair or bedside chair)?: None Help needed to walk in hospital room?: None Help needed climbing 3-5 steps with a railing? : None 6 Click Score: 24    End of Session   Activity Tolerance: Patient tolerated treatment well Patient left: in bed;with call bell/phone within reach Nurse Communication: Mobility status PT Visit Diagnosis: Unsteadiness on feet (R26.81);Other abnormalities of gait and mobility (R26.89);Difficulty in walking, not elsewhere classified (R26.2)    Time: 6381-7711 PT Time Calculation (min) (ACUTE ONLY): 24 min   Charges:   PT Evaluation $PT Eval Moderate Complexity: 1 Mod PT Treatments $Therapeutic Activity: 23-37 mins        10:10 AM, 05/03/20 Lonell Grandchild, MPT Physical Therapist with [facility] [phone] office [phone] mobile phone

## 2020-05-03 NOTE — ED Provider Notes (Signed)
[facility] EMERGENCY DEPARTMENT Provider Note   CSN: [id number] Arrival date & time: 05/03/20  0001     History Chief Complaint  Patient presents with  . Near Syncope    [name] is a 44 y.o. female.  Patient presents with acute onset of lightheadedness with nausea and vomiting.  States symptoms started about 1015 PM while she was driving.  She experienced sudden onset feeling lightheaded with nausea.  She denies room spinning dizziness however.  No focal weakness in her arms, legs.  No difficulty speaking or swallowing.  No chest pain or shortness of breath.  No abdominal pain, diarrhea, cough or fever. No vision changes.  States this has never happened before.  States she feels like she is going to pass out and feels like she is off balance but does not feel like the room is spinning.  She feels like there is "something wrong with her head" but does not have a headache.  She denies any drug or alcohol use.  States normal p.o. intake today.  No chronic medical conditions or chronic medical problems. She had one episode of vomiting prior to arrival one episode of vomiting here  The history is provided by the patient.  Near Syncope Pertinent negatives include no chest pain, no abdominal pain and no shortness of breath.       Past Medical History:  Diagnosis Date  . Anemia   . GERD (gastroesophageal reflux disease)   . Helicobacter pylori (H. pylori)    prevpac  . Hemorrhoids     Patient Active Problem List   Diagnosis Date Noted  . Monilial vaginitis 09/20/2017  . S/P abdominal supracervical subtotal hysterectomy 06/03/2017  . GERD 03/07/2010  . EPIGASTRIC PAIN 03/07/2010  . HELICOBACTER PYLORI INFECTION, HX OF 03/07/2010    Past Surgical History:  Procedure Laterality Date  . ABDOMINAL HYSTERECTOMY    . APPENDECTOMY     44 years old  . ESOPHAGOGASTRODUODENOSCOPY  09/14/2007   Dr. Hurley Cisco quadrant distal esophageal erosion consistant with  moderately  severe erosive reflux esophagitits, o/w normal esophagus.,tiny antral erosions o/w normal stomach, inflammation on bx  . ESOPHAGOGASTRODUODENOSCOPY (EGD) WITH ESOPHAGEAL DILATION  12/09/2012   Dr. Gala Romney- normal esophagus s/p maloney dilation, small hiatal hernia  . SALPINGOOPHORECTOMY Right 06/03/2017   Procedure: SALPINGO OOPHORECTOMY;  Surgeon: Florian Buff, MD;  Location: [facility];  Service: Gynecology;  Laterality: Right;  . SUPRACERVICAL ABDOMINAL HYSTERECTOMY N/A 06/03/2017   Procedure: HYSTERECTOMY SUPRACERVICAL ABDOMINAL;  Surgeon: Florian Buff, MD;  Location: [facility];  Service: Gynecology;  Laterality: N/A;  . UNILATERAL SALPINGECTOMY Left 06/03/2017   Procedure: left salpingectomy;  Surgeon: Florian Buff, MD;  Location: [facility];  Service: Gynecology;  Laterality: Left;     OB History    Gravida      Para      Term      Preterm      AB      Living  0     SAB      TAB      Ectopic      Multiple      Live Births              No family history on file.  Social History   Tobacco Use  . Smoking status: Never Smoker  . Smokeless tobacco: Never Used  Vaping Use  . Vaping Use: Never used  Substance Use Topics  . Alcohol use: No  . Drug use:  No    Home Medications Prior to Admission medications   Medication Sig Start Date End Date Taking? Authorizing Provider  albuterol (PROVENTIL HFA;VENTOLIN HFA) 108 (90 Base) MCG/ACT inhaler Inhale into the lungs every 6 (six) hours as needed for wheezing or shortness of breath.    [provider]  cetirizine (ZYRTEC) 10 MG tablet Take 10 mg by mouth daily as needed for allergies.     [provider]  Multiple Vitamins-Minerals (MULTIVITAMIN ADULT PO) Take 1 tablet by mouth as needed.     [provider]  omeprazole (PRILOSEC) 20 MG capsule Take 20 mg as needed by mouth.     [provider]  triamcinolone cream (KENALOG) 0.1 % Apply 1 application topically 2 (two) times daily as needed  (eczema).     [provider]    Allergies    Tamiflu [oseltamivir phosphate], Levofloxacin, and Sulfonamide derivatives  Review of Systems   Review of Systems  Constitutional: Negative for activity change, appetite change, fatigue and fever.  HENT: Negative for congestion and rhinorrhea.   Respiratory: Negative for cough and shortness of breath.   Cardiovascular: Positive for near-syncope. Negative for chest pain.  Gastrointestinal: Positive for nausea and vomiting. Negative for abdominal pain.  Genitourinary: Negative for dysuria and hematuria.  Musculoskeletal: Negative for arthralgias, back pain and myalgias.  Skin: Negative for wound.  Neurological: Positive for dizziness and light-headedness.   all other systems are negative except as noted in the HPI and PMH.    Physical Exam Updated Vital Signs BP (!) 143/88 (BP Location: Left Arm)   Pulse 85   Temp 98.5 F (36.9 C) (Oral)   Resp 15   Ht 5\' 2"  (1.575 m)   Wt 61.2 kg   LMP 06/03/2017   SpO2 100%   BMI 24.69 kg/m   Physical Exam Vitals and nursing note reviewed.  Constitutional:      General: She is not in acute distress.    Appearance: She is well-developed.     Comments: Lying still on stretcher, holding head  HENT:     Head: Normocephalic and atraumatic.     Mouth/Throat:     Pharynx: No oropharyngeal exudate.  Eyes:     Conjunctiva/sclera: Conjunctivae normal.     Pupils: Pupils are equal, round, and reactive to light.  Neck:     Comments: No meningismus. Cardiovascular:     Rate and Rhythm: Normal rate and regular rhythm.     Heart sounds: Normal heart sounds. No murmur heard.   Pulmonary:     Effort: Pulmonary effort is normal. No respiratory distress.     Breath sounds: Normal breath sounds.  Chest:     Chest wall: No tenderness.  Abdominal:     Palpations: Abdomen is soft.     Tenderness: There is no abdominal tenderness. There is no guarding or rebound.  Musculoskeletal:         General: No tenderness. Normal range of motion.     Cervical back: Normal range of motion and neck supple.  Skin:    General: Skin is warm.  Neurological:     Mental Status: She is alert and oriented to person, place, and time.     Cranial Nerves: No cranial nerve deficit.     Motor: No abnormal muscle tone.     Coordination: Coordination normal.     Comments: CN 2-12 intact, no ataxia on finger to nose, no nystagmus, 5/5 strength throughout, no pronator drift,   No  nystagmus.  No ataxia on finger-to-nose.  Unable to stand without assistance.  Wide-based stance, falling over when she tries to stand up. Unable to ambulate    Psychiatric:        Behavior: Behavior normal.     ED Results / Procedures / Treatments   Labs (all labs ordered are listed, but only abnormal results are displayed) Labs Reviewed  CBC WITH DIFFERENTIAL/PLATELET - Abnormal; Notable for the following components:      Result Value   WBC 11.4 (*)    Platelets 411 (*)    Neutro Abs 9.2 (*)    All other components within normal limits  BASIC METABOLIC PANEL - Abnormal; Notable for the following components:   Glucose, Bld 118 (*)    All other components within normal limits  URINALYSIS, ROUTINE W REFLEX MICROSCOPIC - Abnormal; Notable for the following components:   Color, Urine STRAW (*)    Leukocytes,Ua SMALL (*)    All other components within normal limits  I-STAT CHEM 8, ED - Abnormal; Notable for the following components:   Glucose, Bld 119 (*)    All other components within normal limits  SARS CORONAVIRUS 2 BY RT PCR (HOSPITAL ORDER, [place] [facility])  HCG, SERUM, QUALITATIVE  ETHANOL  PROTIME-INR  APTT  RAPID URINE DRUG SCREEN, HOSP PERFORMED  CBG MONITORING, ED  POC URINE PREG, ED  TROPONIN I (HIGH SENSITIVITY)  TROPONIN I (HIGH SENSITIVITY)    EKG EKG Interpretation  Date/Time:  Thursday May 03 2020 00:45:01 EDT Ventricular Rate:  82 PR Interval:    QRS  Duration: 79 QT Interval:  366 QTC Calculation: 428 R Axis:   5 Text Interpretation: Sinus rhythm Low voltage, precordial leads No previous ECGs available Confirmed by Ezequiel Essex [phone]) on 05/03/2020 1:07:23 AM   Radiology CT Angio Head W or Wo Contrast  Result Date: 05/03/2020 CLINICAL DATA:  Dizziness and ataxia EXAM: CT ANGIOGRAPHY HEAD AND NECK CT PERFUSION BRAIN TECHNIQUE: Multidetector CT imaging of the head and neck was performed using the standard protocol during bolus administration of intravenous contrast. Multiplanar CT image reconstructions and MIPs were obtained to evaluate the vascular anatomy. Carotid stenosis measurements (when applicable) are obtained utilizing NASCET criteria, using the distal internal carotid diameter as the denominator. Multiphase CT imaging of the brain was performed following IV bolus contrast injection. Subsequent parametric perfusion maps were calculated using RAPID software. CONTRAST:  162mL OMNIPAQUE IOHEXOL 350 MG/ML SOLN COMPARISON:  None. FINDINGS: CTA NECK FINDINGS SKELETON: There is no bony spinal canal stenosis. No lytic or blastic lesion. OTHER NECK: Normal pharynx, larynx and major salivary glands. No cervical lymphadenopathy. Unremarkable thyroid gland. UPPER CHEST: No pneumothorax or pleural effusion. No nodules or masses. AORTIC ARCH: There is no calcific atherosclerosis of the aortic arch. There is no aneurysm, dissection or hemodynamically significant stenosis of the visualized portion of the aorta. Conventional 3 vessel aortic branching pattern. The visualized proximal subclavian arteries are widely patent. RIGHT CAROTID SYSTEM: Normal without aneurysm, dissection or stenosis. LEFT CAROTID SYSTEM: Normal without aneurysm, dissection or stenosis. VERTEBRAL ARTERIES: Left dominant configuration. Both origins are clearly patent. There is no dissection, occlusion or flow-limiting stenosis to the skull base (V1-V3 segments). CTA HEAD FINDINGS  POSTERIOR CIRCULATION: --Vertebral arteries: Normal V4 segments. --Inferior cerebellar arteries: Normal. --Basilar artery: Normal. --Superior cerebellar arteries: Normal. --Posterior cerebral arteries (PCA): Normal. ANTERIOR CIRCULATION: --Intracranial internal carotid arteries: Normal. --Anterior cerebral arteries (ACA): Normal. Both A1 segments are present. Patent anterior communicating artery (a-comm). --  Middle cerebral arteries (MCA): Normal. VENOUS SINUSES: As permitted by contrast timing, patent. ANATOMIC VARIANTS: None Review of the MIP images confirms the above findings. CT Brain Perfusion Findings: ASPECTS: 10 CBF (<30%) Volume: 17mL Perfusion (Tmax>6.0s) volume: 70mL Mismatch Volume: 61mL Infarction Location:None IMPRESSION: 1. Normal CTA of the head and neck. 2. Normal CT perfusion. Electronically Signed   By: Ulyses Jarred M.D.   On: 05/03/2020 01:48   CT Angio Neck W and/or Wo Contrast  Result Date: 05/03/2020 CLINICAL DATA:  Dizziness and ataxia EXAM: CT ANGIOGRAPHY HEAD AND NECK CT PERFUSION BRAIN TECHNIQUE: Multidetector CT imaging of the head and neck was performed using the standard protocol during bolus administration of intravenous contrast. Multiplanar CT image reconstructions and MIPs were obtained to evaluate the vascular anatomy. Carotid stenosis measurements (when applicable) are obtained utilizing NASCET criteria, using the distal internal carotid diameter as the denominator. Multiphase CT imaging of the brain was performed following IV bolus contrast injection. Subsequent parametric perfusion maps were calculated using RAPID software. CONTRAST:  136mL OMNIPAQUE IOHEXOL 350 MG/ML SOLN COMPARISON:  None. FINDINGS: CTA NECK FINDINGS SKELETON: There is no bony spinal canal stenosis. No lytic or blastic lesion. OTHER NECK: Normal pharynx, larynx and major salivary glands. No cervical lymphadenopathy. Unremarkable thyroid gland. UPPER CHEST: No pneumothorax or pleural effusion. No nodules or  masses. AORTIC ARCH: There is no calcific atherosclerosis of the aortic arch. There is no aneurysm, dissection or hemodynamically significant stenosis of the visualized portion of the aorta. Conventional 3 vessel aortic branching pattern. The visualized proximal subclavian arteries are widely patent. RIGHT CAROTID SYSTEM: Normal without aneurysm, dissection or stenosis. LEFT CAROTID SYSTEM: Normal without aneurysm, dissection or stenosis. VERTEBRAL ARTERIES: Left dominant configuration. Both origins are clearly patent. There is no dissection, occlusion or flow-limiting stenosis to the skull base (V1-V3 segments). CTA HEAD FINDINGS POSTERIOR CIRCULATION: --Vertebral arteries: Normal V4 segments. --Inferior cerebellar arteries: Normal. --Basilar artery: Normal. --Superior cerebellar arteries: Normal. --Posterior cerebral arteries (PCA): Normal. ANTERIOR CIRCULATION: --Intracranial internal carotid arteries: Normal. --Anterior cerebral arteries (ACA): Normal. Both A1 segments are present. Patent anterior communicating artery (a-comm). --Middle cerebral arteries (MCA): Normal. VENOUS SINUSES: As permitted by contrast timing, patent. ANATOMIC VARIANTS: None Review of the MIP images confirms the above findings. CT Brain Perfusion Findings: ASPECTS: 10 CBF (<30%) Volume: 11mL Perfusion (Tmax>6.0s) volume: 24mL Mismatch Volume: 70mL Infarction Location:None IMPRESSION: 1. Normal CTA of the head and neck. 2. Normal CT perfusion. Electronically Signed   By: Ulyses Jarred M.D.   On: 05/03/2020 01:48   CT CEREBRAL PERFUSION W CONTRAST  Result Date: 05/03/2020 CLINICAL DATA:  Dizziness and ataxia EXAM: CT ANGIOGRAPHY HEAD AND NECK CT PERFUSION BRAIN TECHNIQUE: Multidetector CT imaging of the head and neck was performed using the standard protocol during bolus administration of intravenous contrast. Multiplanar CT image reconstructions and MIPs were obtained to evaluate the vascular anatomy. Carotid stenosis measurements (when  applicable) are obtained utilizing NASCET criteria, using the distal internal carotid diameter as the denominator. Multiphase CT imaging of the brain was performed following IV bolus contrast injection. Subsequent parametric perfusion maps were calculated using RAPID software. CONTRAST:  110mL OMNIPAQUE IOHEXOL 350 MG/ML SOLN COMPARISON:  None. FINDINGS: CTA NECK FINDINGS SKELETON: There is no bony spinal canal stenosis. No lytic or blastic lesion. OTHER NECK: Normal pharynx, larynx and major salivary glands. No cervical lymphadenopathy. Unremarkable thyroid gland. UPPER CHEST: No pneumothorax or pleural effusion. No nodules or masses. AORTIC ARCH: There is no calcific atherosclerosis of the aortic arch. There is  no aneurysm, dissection or hemodynamically significant stenosis of the visualized portion of the aorta. Conventional 3 vessel aortic branching pattern. The visualized proximal subclavian arteries are widely patent. RIGHT CAROTID SYSTEM: Normal without aneurysm, dissection or stenosis. LEFT CAROTID SYSTEM: Normal without aneurysm, dissection or stenosis. VERTEBRAL ARTERIES: Left dominant configuration. Both origins are clearly patent. There is no dissection, occlusion or flow-limiting stenosis to the skull base (V1-V3 segments). CTA HEAD FINDINGS POSTERIOR CIRCULATION: --Vertebral arteries: Normal V4 segments. --Inferior cerebellar arteries: Normal. --Basilar artery: Normal. --Superior cerebellar arteries: Normal. --Posterior cerebral arteries (PCA): Normal. ANTERIOR CIRCULATION: --Intracranial internal carotid arteries: Normal. --Anterior cerebral arteries (ACA): Normal. Both A1 segments are present. Patent anterior communicating artery (a-comm). --Middle cerebral arteries (MCA): Normal. VENOUS SINUSES: As permitted by contrast timing, patent. ANATOMIC VARIANTS: None Review of the MIP images confirms the above findings. CT Brain Perfusion Findings: ASPECTS: 10 CBF (<30%) Volume: 68mL Perfusion (Tmax>6.0s)  volume: 16mL Mismatch Volume: 15mL Infarction Location:None IMPRESSION: 1. Normal CTA of the head and neck. 2. Normal CT perfusion. Electronically Signed   By: Ulyses Jarred M.D.   On: 05/03/2020 01:48   CT HEAD CODE STROKE WO CONTRAST  Result Date: 05/03/2020 CLINICAL DATA:  Code stroke.  Vertigo EXAM: CT HEAD WITHOUT CONTRAST TECHNIQUE: Contiguous axial images were obtained from the base of the skull through the vertex without intravenous contrast. COMPARISON:  None. FINDINGS: Brain: There is no mass, hemorrhage or extra-axial collection. The size and configuration of the ventricles and extra-axial CSF spaces are normal. The brain parenchyma is normal, without evidence of acute or chronic infarction. Vascular: No abnormal hyperdensity of the major intracranial arteries or dural venous sinuses. No intracranial atherosclerosis. Skull: The visualized skull base, calvarium and extracranial soft tissues are normal. Sinuses/Orbits: No fluid levels or advanced mucosal thickening of the visualized paranasal sinuses. No mastoid or middle ear effusion. The orbits are normal. ASPECTS [facility] Stroke Program Early CT Score) - Ganglionic level infarction (caudate, lentiform nuclei, internal capsule, insula, M1-M3 cortex): 7 - Supraganglionic infarction (M4-M6 cortex): 3 Total score (0-10 with 10 being normal): 10 IMPRESSION: 1. Normal head CT. 2. ASPECTS is 10. These results were called by telephone at the time of interpretation on 05/03/2020 at 1:33 am to provider [facility] , who verbally acknowledged these results. Electronically Signed   By: Ulyses Jarred M.D.   On: 05/03/2020 01:33    Procedures .Critical Care Performed by: Ezequiel Essex, MD Authorized by: Ezequiel Essex, MD   Critical care provider statement:    Critical care time (minutes):  35   Critical care was necessary to treat or prevent imminent or life-threatening deterioration of the following conditions:  CNS failure or compromise    Critical care was time spent personally by me on the following activities:  Discussions with consultants, evaluation of patient's response to treatment, examination of patient, ordering and performing treatments and interventions, ordering and review of laboratory studies, ordering and review of radiographic studies, pulse oximetry, re-evaluation of patient's condition, obtaining history from patient or surrogate and review of old charts   (including critical care time)  Medications Ordered in ED Medications  sodium chloride 0.9 % bolus 1,000 mL (has no administration in time range)  ondansetron (ZOFRAN) injection 4 mg (has no administration in time range)    ED Course  I have reviewed the triage vital signs and the nursing notes.  Pertinent labs & imaging results that were available during my care of the patient were reviewed by me and considered in my medical  decision making (see chart for details).    MDM Rules/Calculators/A&P                         Patient with sudden onset lightheadedness and feeling off in her head with balance problems and positive Romberg.  New onset ataxia gait but normal finger to nose and no nystagmus.  Last seen normal at 10:15 PM.  Code stroke activated.  Patient not ataxic on finger-to-nose but does have unsteadiness when attempted standing attempted ambulation.  She denies any headache or pain.  CT is negative for hemorrhage.  CTA shows no large vessel occlusion.  Vertebral arteries and basilar arteries are patent.  CT perfusion study is negative.  Discussed with neurology Dr. Reesa Chew.  She evaluated patient.  She agrees that patient is unsteady on her feet but seems to be improving.  Does not recommend TPA though small non disabling CVA is still possible. Patient agrees to defer tPA as well. Dr. Reesa Chew Recommends admission for MRI and stroke work-up.  Suspect peripheral vertigo. Agrees with Zofran and meclizine and fluids  Results discussed with patient.  She  feels improved.  Denies headache.  Denies vision changes.  She is agreeable to admission for MRI and stroke rule out. Labs reassuring.   Discussed with Dr. Darrick Meigs. Final Clinical Impression(s) / ED Diagnoses Final diagnoses:  Unsteady gait  Non-intractable vomiting with nausea, unspecified vomiting type    Rx / DC Orders ED Discharge Orders    None       Dontez Hauss, Annie Main, MD 05/03/20 [phone]

## 2020-05-03 NOTE — ED Notes (Signed)
When patient got out of bed to perform standing orthostatic vitals patient was very unsteady and needed hands on assistance to stand. Patient shaking at this time and states that her head just doesn't feel right up there. Also obtained a CBG on patient. Patient back in bed at this time.

## 2020-05-03 NOTE — Consult Note (Signed)
[facility] Consult Services   Date of Service:   05/03/2020 01:22:01  Impression:     .  R42 - Dizziness/ Vertigo/ Giddiness  Comments/Sign-Out: 43 yr old woman with hx of anemia, GERD, with sudden onset positional dizziness, nausea, vomiting, she had headache earlier, and NIH 0- she has no ataxia on testing, but gait is wide based, and unsteady, she feels better when she does not move her head or neck. She has no focal weakness, numbness, vision, or speech impairment. CT head unremarkable CT perfusion normal CTA head and neck unremarkable, no LVO. Diff Dx: rule out CVA, peripheral vestibulopathy. Alteplase discussed with her, but she has no focal symptoms, and symptoms appear to be positional clinically, CTA and CT perfusion all normal, therefore, decision made with patient and ER all together to defer alteplase as risk may outweigh benefit. Patient in agreement with plan. Rec: - CVA rule out admit - ASA - MRI Brain to r/o cva - supportive treatment, vestibular therapy - Neurology follow up d/w pt, ER all of the above recs.  Metrics: Last Known Well: 05/02/2020 22:00:00 [facility] Notification Time: 05/03/2020 01:22:01 Arrival Time: 05/03/2020 00:01:00 Stamp Time: 05/03/2020 01:22:01 Time First Login Attempt: 05/03/2020 01:26:00 Symptoms: dizziness NIHSS Start Assessment Time: 05/03/2020 01:35:00 Patient is not a candidate for Alteplase/Activase. Alteplase Medical Decision: 05/03/2020 01:38:00 Patient was not deemed candidate for Alteplase/Activase thrombolytics because of following reasons: No disabling symptoms.  CT head showed no acute hemorrhage or acute core infarct.  Radiologist was not called back for review of advanced imaging because reviewed ED Physician notified of diagnostic impression and management plan on 05/03/2020 02:04:59  Advanced Imaging: CTA Head and Neck Completed.  CTP Completed.  LVO:No   Our recommendations are  outlined below.  Recommendations:     .  Activate Stroke Protocol Admission/Order Set     .  Stroke/Telemetry Floor     .  Neuro Checks     .  Bedside Swallow Eval     .  DVT Prophylaxis     .  IV Fluids, Normal Saline     .  Head of Bed 30 Degrees     .  Euglycemia and Avoid Hyperthermia (PRN Acetaminophen)     .  Antiplatelet Therapy Recommended     .  - CVA rule out admit     .  - ASA     .  - MRI Brain to r/o cva     .  - supportive treatment, vestibular therapy     .  - Neurology follow up     .  d/w pt, ER all of the above recs.  Routine Consultation with [facility] for Follow up Care  Sign Out:     .  Discussed with Emergency Department Provider    ------------------------------------------------------------------------------  History of Present Illness: Patient is a 44 year old Female.  Patient was brought by private transportation with symptoms of dizziness  44 yr old woman, wit hx of anemia, GERD, who was driving around 10: 15 pm and noted sudden onset positional dizziness, headache, and she had nausea, later she had vomiting. She was escorted home by police per mother who is at bedside, who then later brought her to ER. She has vomiting, and positional unsteadiness, when she tries to walk, she is unsteady and wide based. She has no diplopia, vision changes, no aphasia, dysarthria, no focal weakness, numbness, tingling, no ataxia on exam with finger to nose, heel to shin. Gait  is improving in ER.     NIHSS may not be reliable due to: positional symptoms of lightheadedness, and unsteady gait, no ataxia on FTN or Heel to shin exam  Examination: BP(137/91), Pulse(91), Blood Glucose(95) 1A: Level of Consciousness - Alert; keenly responsive + 0 1B: Ask Month and Age - Both Questions Right + 0 1C: Blink Eyes & Squeeze Hands - Performs Both Tasks + 0 2: Test Horizontal Extraocular Movements - Normal + 0 3: Test Visual Fields - No Visual Loss + 0 4: Test Facial  Palsy (Use Grimace if Obtunded) - Normal symmetry + 0 5A: Test Left Arm Motor Drift - No Drift for 10 Seconds + 0 5B: Test Right Arm Motor Drift - No Drift for 10 Seconds + 0 6A: Test Left Leg Motor Drift - No Drift for 5 Seconds + 0 6B: Test Right Leg Motor Drift - No Drift for 5 Seconds + 0 7: Test Limb Ataxia (FNF/Heel-Shin) - No Ataxia + 0 8: Test Sensation - Normal; No sensory loss + 0 9: Test Language/Aphasia - Normal; No aphasia + 0 10: Test Dysarthria - Normal + 0 11: Test Extinction/Inattention - No abnormality + 0  NIHSS Score: 0  Pre-Morbid Modified Ranking Scale: 0 Points = No symptoms at all   Patient/Family was informed the Neurology Consult would occur via TeleHealth consult by way of interactive audio and video telecommunications and consented to receiving care in this manner.   Patient is being evaluated for possible acute neurologic impairment and high probability of imminent or life-threatening deterioration. I spent total of 35 minutes providing care to this patient, including time for face to face visit via telemedicine, review of medical records, imaging studies and discussion of findings with providers, the patient and/or family.   Dr Lloyd Huger   [facility] [phone]  Case [id number]

## 2020-05-03 NOTE — ED Notes (Signed)
Patient transported to MRI 

## 2020-05-03 NOTE — ED Triage Notes (Addendum)
Pt reports feeling lightheaded and "like I was going to pass out" around 2230 tonight while driving. Pt reports nausea and vomiting. Denies pain.

## 2020-05-03 NOTE — ED Notes (Signed)
EKG given to Dr Evans Lance. Also made EDP aware of patients status at this time. EDP  In room to assess patient.

## 2020-05-03 NOTE — TOC Transition Note (Signed)
Transition of Care [facility]) - CM/SW Discharge Note  Patient Details  Name: YESLY GERETY MRN: [id number] Date of Birth: 1976-05-28  [facility] CM/SW Contact:  Sherie Don, LCSW Phone Number: 05/03/2020, 10:55 AM  Clinical Narrative: Patient is a 44 year old female under observation for dizziness. PT evaluation recommends OP PT due to unsteady gait. CSW called patient to discuss OP PT options. Patient agreeable to referral to [facility] as she resides in [place]. Order added to discharge. Hospitalist notified of need for co-sign. TOC signing off.  Final next level of care: OP Rehab Barriers to Discharge: Barriers Resolved  Patient Goals and CMS Choice Choice offered to / list presented to : Patient     DME Arranged: N/A DME Agency: NA HH Arranged: NA [place] Agency: NA  Readmission Risk Interventions No flowsheet data found.

## 2020-05-03 NOTE — Discharge Summary (Signed)
Physician Discharge Summary [facility]    Patient: [name]                   Admit date: 05/03/2020   DOB: 1976/05/29             Discharge date:05/03/2020/9:23 AM [id number]                          PCP: Lemmie Evens, MD  Disposition: Home  Recommendations for Outpatient Follow-up:   . Follow up: in 2 week  Discharge Condition: Stable   Code Status:   Code Status: Full Code  Diet recommendation: Cardiac diet   Discharge Diagnoses:    Active Problems:   Dizziness   History of Present Illness/ Hospital Course [name] Summary:   [name]  is a 44 y.o. female, with no significant medical problem came to ED when patient had episode of lightheadedness while she was driving back home around 10:15 PM. Patient said that she had a sudden onset of lightheadedness along with nausea. Denies room spinning. Never had episode like this before. Patient had vomiting when she arrived in the ED. Denies blurred vision. Denies weakness of extremities. Denies numbness. Denies any vision changes. No slurred speech. In the ED patient was evaluated by tele neurologist, and was advised to obtain MRI brain to rule out stroke. He denies chest pain or shortness of breath Denies dysuria, fever or chills. No previous history of stroke or seizures.    Dizziness-unclear etiology, BPPV versus stroke. --- Patient was placed in observation, completed the stroke work-up which included CT of the head, CT angiogram head and neck and MRI of the brain were all within normal limits.  Patient was continued hydrated with IV fluid normal saline, dizziness have improved.  Blood pressure stabilized, no change in heart rate.  2D echocardiogram ordered and reviewed.  Patient is feeling much better, patient was complaining of mild episodic tinnitus-patient was evaluated by physical therapy, for BPV evaluation and therapy.  Patient overall is feeling better ready to be discharged home she  is instructed to follow with PCP.  If continues have tinnitus she is to follow with ENT for further evaluation as an outpatient.   1 dose of aspirin given in the ED. Start meclizine 25 mg 3 times daily as needed.    Discharge Instructions:   Discharge Instructions    Activity as tolerated - No restrictions   Complete by: As directed    Call MD for:  extreme fatigue   Complete by: As directed    Call MD for:  persistant dizziness or light-headedness   Complete by: As directed    Call MD for:  temperature >100.4   Complete by: As directed    Diet - low sodium heart healthy   Complete by: As directed    Discharge instructions   Complete by: As directed    Continue rest, advance activity as tolerated, follow with PCP and recommended ENT follow-up if continue to have issues with vertigo and ringing in the ears   Increase activity slowly   Complete by: As directed        Medication List    TAKE these medications   albuterol 108 (90 Base) MCG/ACT inhaler Commonly known as: VENTOLIN HFA Inhale into the lungs every 6 (six) hours as needed for wheezing or shortness of breath.   cetirizine 10 MG tablet Commonly known as: ZYRTEC Take 10 mg by mouth  daily as needed for allergies.   meclizine 25 MG tablet Commonly known as: ANTIVERT Take 1 tablet (25 mg total) by mouth 3 (three) times daily as needed for dizziness.   MULTIVITAMIN ADULT PO Take 1 tablet by mouth as needed.   omeprazole 20 MG capsule Commonly known as: PRILOSEC Take 20 mg as needed by mouth.   triamcinolone cream 0.1 % Commonly known as: KENALOG Apply 1 application topically 2 (two) times daily as needed (eczema).       Allergies  Allergen Reactions  . Tamiflu [Oseltamivir Phosphate] Other (See Comments)    Anxiety, insomnia, shaky, vomiting  . Levofloxacin   . Sulfonamide Derivatives Hives     Procedures /Studies:   CT Angio Head W or Wo Contrast  Result Date: 05/03/2020 CLINICAL DATA:  Dizziness  and ataxia EXAM: CT ANGIOGRAPHY HEAD AND NECK CT PERFUSION BRAIN TECHNIQUE: Multidetector CT imaging of the head and neck was performed using the standard protocol during bolus administration of intravenous contrast. Multiplanar CT image reconstructions and MIPs were obtained to evaluate the vascular anatomy. Carotid stenosis measurements (when applicable) are obtained utilizing NASCET criteria, using the distal internal carotid diameter as the denominator. Multiphase CT imaging of the brain was performed following IV bolus contrast injection. Subsequent parametric perfusion maps were calculated using RAPID software. CONTRAST:  116mL OMNIPAQUE IOHEXOL 350 MG/ML SOLN COMPARISON:  None. FINDINGS: CTA NECK FINDINGS SKELETON: There is no bony spinal canal stenosis. No lytic or blastic lesion. OTHER NECK: Normal pharynx, larynx and major salivary glands. No cervical lymphadenopathy. Unremarkable thyroid gland. UPPER CHEST: No pneumothorax or pleural effusion. No nodules or masses. AORTIC ARCH: There is no calcific atherosclerosis of the aortic arch. There is no aneurysm, dissection or hemodynamically significant stenosis of the visualized portion of the aorta. Conventional 3 vessel aortic branching pattern. The visualized proximal subclavian arteries are widely patent. RIGHT CAROTID SYSTEM: Normal without aneurysm, dissection or stenosis. LEFT CAROTID SYSTEM: Normal without aneurysm, dissection or stenosis. VERTEBRAL ARTERIES: Left dominant configuration. Both origins are clearly patent. There is no dissection, occlusion or flow-limiting stenosis to the skull base (V1-V3 segments). CTA HEAD FINDINGS POSTERIOR CIRCULATION: --Vertebral arteries: Normal V4 segments. --Inferior cerebellar arteries: Normal. --Basilar artery: Normal. --Superior cerebellar arteries: Normal. --Posterior cerebral arteries (PCA): Normal. ANTERIOR CIRCULATION: --Intracranial internal carotid arteries: Normal. --Anterior cerebral arteries (ACA):  Normal. Both A1 segments are present. Patent anterior communicating artery (a-comm). --Middle cerebral arteries (MCA): Normal. VENOUS SINUSES: As permitted by contrast timing, patent. ANATOMIC VARIANTS: None Review of the MIP images confirms the above findings. CT Brain Perfusion Findings: ASPECTS: 10 CBF (<30%) Volume: 43mL Perfusion (Tmax>6.0s) volume: 39mL Mismatch Volume: 53mL Infarction Location:None IMPRESSION: 1. Normal CTA of the head and neck. 2. Normal CT perfusion. Electronically Signed   By: Ulyses Jarred M.D.   On: 05/03/2020 01:48   CT Angio Neck W and/or Wo Contrast  Result Date: 05/03/2020 CLINICAL DATA:  Dizziness and ataxia EXAM: CT ANGIOGRAPHY HEAD AND NECK CT PERFUSION BRAIN TECHNIQUE: Multidetector CT imaging of the head and neck was performed using the standard protocol during bolus administration of intravenous contrast. Multiplanar CT image reconstructions and MIPs were obtained to evaluate the vascular anatomy. Carotid stenosis measurements (when applicable) are obtained utilizing NASCET criteria, using the distal internal carotid diameter as the denominator. Multiphase CT imaging of the brain was performed following IV bolus contrast injection. Subsequent parametric perfusion maps were calculated using RAPID software. CONTRAST:  174mL OMNIPAQUE IOHEXOL 350 MG/ML SOLN COMPARISON:  None. FINDINGS: CTA NECK FINDINGS  SKELETON: There is no bony spinal canal stenosis. No lytic or blastic lesion. OTHER NECK: Normal pharynx, larynx and major salivary glands. No cervical lymphadenopathy. Unremarkable thyroid gland. UPPER CHEST: No pneumothorax or pleural effusion. No nodules or masses. AORTIC ARCH: There is no calcific atherosclerosis of the aortic arch. There is no aneurysm, dissection or hemodynamically significant stenosis of the visualized portion of the aorta. Conventional 3 vessel aortic branching pattern. The visualized proximal subclavian arteries are widely patent. RIGHT CAROTID SYSTEM:  Normal without aneurysm, dissection or stenosis. LEFT CAROTID SYSTEM: Normal without aneurysm, dissection or stenosis. VERTEBRAL ARTERIES: Left dominant configuration. Both origins are clearly patent. There is no dissection, occlusion or flow-limiting stenosis to the skull base (V1-V3 segments). CTA HEAD FINDINGS POSTERIOR CIRCULATION: --Vertebral arteries: Normal V4 segments. --Inferior cerebellar arteries: Normal. --Basilar artery: Normal. --Superior cerebellar arteries: Normal. --Posterior cerebral arteries (PCA): Normal. ANTERIOR CIRCULATION: --Intracranial internal carotid arteries: Normal. --Anterior cerebral arteries (ACA): Normal. Both A1 segments are present. Patent anterior communicating artery (a-comm). --Middle cerebral arteries (MCA): Normal. VENOUS SINUSES: As permitted by contrast timing, patent. ANATOMIC VARIANTS: None Review of the MIP images confirms the above findings. CT Brain Perfusion Findings: ASPECTS: 10 CBF (<30%) Volume: 59mL Perfusion (Tmax>6.0s) volume: 43mL Mismatch Volume: 62mL Infarction Location:None IMPRESSION: 1. Normal CTA of the head and neck. 2. Normal CT perfusion. Electronically Signed   By: Ulyses Jarred M.D.   On: 05/03/2020 01:48   MR BRAIN WO CONTRAST  Result Date: 05/03/2020 CLINICAL DATA:  Ataxia. Stroke suspected. The examination had to be discontinued prior to completion due to patient refusal of further imaging. EXAM: MRI HEAD WITHOUT CONTRAST TECHNIQUE: Multiplanar, multiecho pulse sequences of the brain and surrounding structures were obtained without intravenous contrast. COMPARISON:  CT head without contrast and CT perfusion 05/03/2020 FINDINGS: Brain: Diffusion-weighted images demonstrate no acute or subacute infarction. No acute hemorrhage or mass lesion is present. Ventricles are of normal size. No significant extra-axial fluid collection is present. Vascular: Incompletely assessed without T2 weighted sequences. Skull and upper cervical spine: The  craniocervical junction is normal. Upper cervical spine is within normal limits. Marrow signal is unremarkable. Sinuses/Orbits: The paranasal sinuses and mastoid air cells are clear. The globes and orbits are within normal limits. IMPRESSION: Normal MRI of the brain. No acute or focal lesion to explain the patient's symptoms. Electronically Signed   By: San Morelle M.D.   On: 05/03/2020 07:24   CT CEREBRAL PERFUSION W CONTRAST  Result Date: 05/03/2020 CLINICAL DATA:  Dizziness and ataxia EXAM: CT ANGIOGRAPHY HEAD AND NECK CT PERFUSION BRAIN TECHNIQUE: Multidetector CT imaging of the head and neck was performed using the standard protocol during bolus administration of intravenous contrast. Multiplanar CT image reconstructions and MIPs were obtained to evaluate the vascular anatomy. Carotid stenosis measurements (when applicable) are obtained utilizing NASCET criteria, using the distal internal carotid diameter as the denominator. Multiphase CT imaging of the brain was performed following IV bolus contrast injection. Subsequent parametric perfusion maps were calculated using RAPID software. CONTRAST:  126mL OMNIPAQUE IOHEXOL 350 MG/ML SOLN COMPARISON:  None. FINDINGS: CTA NECK FINDINGS SKELETON: There is no bony spinal canal stenosis. No lytic or blastic lesion. OTHER NECK: Normal pharynx, larynx and major salivary glands. No cervical lymphadenopathy. Unremarkable thyroid gland. UPPER CHEST: No pneumothorax or pleural effusion. No nodules or masses. AORTIC ARCH: There is no calcific atherosclerosis of the aortic arch. There is no aneurysm, dissection or hemodynamically significant stenosis of the visualized portion of the aorta. Conventional 3 vessel aortic branching pattern.  The visualized proximal subclavian arteries are widely patent. RIGHT CAROTID SYSTEM: Normal without aneurysm, dissection or stenosis. LEFT CAROTID SYSTEM: Normal without aneurysm, dissection or stenosis. VERTEBRAL ARTERIES: Left  dominant configuration. Both origins are clearly patent. There is no dissection, occlusion or flow-limiting stenosis to the skull base (V1-V3 segments). CTA HEAD FINDINGS POSTERIOR CIRCULATION: --Vertebral arteries: Normal V4 segments. --Inferior cerebellar arteries: Normal. --Basilar artery: Normal. --Superior cerebellar arteries: Normal. --Posterior cerebral arteries (PCA): Normal. ANTERIOR CIRCULATION: --Intracranial internal carotid arteries: Normal. --Anterior cerebral arteries (ACA): Normal. Both A1 segments are present. Patent anterior communicating artery (a-comm). --Middle cerebral arteries (MCA): Normal. VENOUS SINUSES: As permitted by contrast timing, patent. ANATOMIC VARIANTS: None Review of the MIP images confirms the above findings. CT Brain Perfusion Findings: ASPECTS: 10 CBF (<30%) Volume: 73mL Perfusion (Tmax>6.0s) volume: 71mL Mismatch Volume: 23mL Infarction Location:None IMPRESSION: 1. Normal CTA of the head and neck. 2. Normal CT perfusion. Electronically Signed   By: Ulyses Jarred M.D.   On: 05/03/2020 01:48   CT HEAD CODE STROKE WO CONTRAST  Result Date: 05/03/2020 CLINICAL DATA:  Code stroke.  Vertigo EXAM: CT HEAD WITHOUT CONTRAST TECHNIQUE: Contiguous axial images were obtained from the base of the skull through the vertex without intravenous contrast. COMPARISON:  None. FINDINGS: Brain: There is no mass, hemorrhage or extra-axial collection. The size and configuration of the ventricles and extra-axial CSF spaces are normal. The brain parenchyma is normal, without evidence of acute or chronic infarction. Vascular: No abnormal hyperdensity of the major intracranial arteries or dural venous sinuses. No intracranial atherosclerosis. Skull: The visualized skull base, calvarium and extracranial soft tissues are normal. Sinuses/Orbits: No fluid levels or advanced mucosal thickening of the visualized paranasal sinuses. No mastoid or middle ear effusion. The orbits are normal. ASPECTS [facility]  Stroke Program Early CT Score) - Ganglionic level infarction (caudate, lentiform nuclei, internal capsule, insula, M1-M3 cortex): 7 - Supraganglionic infarction (M4-M6 cortex): 3 Total score (0-10 with 10 being normal): 10 IMPRESSION: 1. Normal head CT. 2. ASPECTS is 10. These results were called by telephone at the time of interpretation on 05/03/2020 at 1:33 am to provider [facility] , who verbally acknowledged these results. Electronically Signed   By: Ulyses Jarred M.D.   On: 05/03/2020 01:33     Subjective:   Patient was seen and examined 05/03/2020, 9:23 AM Patient stable today. No acute distress.  No issues overnight Stable for discharge.  Discharge Exam:    Vitals:   05/03/20 0500 05/03/20 0530 05/03/20 0600 05/03/20 0758  BP: 106/66 103/65 114/80 104/76  Pulse: 73 75 79 78  Resp: 16 19 20 17   Temp:      TempSrc:      SpO2: 99% 100% 99% 97%  Weight:      Height:        General: Pt lying comfortably in bed & appears in no obvious distress. Cardiovascular: S1 & S2 heard, RRR, S1/S2 +. No murmurs, rubs, gallops or clicks. No JVD or pedal edema. Respiratory: Clear to auscultation without wheezing, rhonchi or crackles. No increased work of breathing. Abdominal:  Non-distended, non-tender & soft. No organomegaly or masses appreciated. Normal bowel sounds heard. CNS: Alert and oriented. No focal deficits. Extremities: no edema, no cyanosis    The results of significant diagnostics from this hospitalization (including imaging, microbiology, ancillary and laboratory) are listed below for reference.      Microbiology:   Recent Results (from the past 240 hour(s))  SARS Coronavirus 2 by RT PCR (hospital order, performed  in [facility] lab) Nasopharyngeal Nasopharyngeal Swab     Status: None   Collection Time: 05/03/20  3:05 AM   Specimen: Nasopharyngeal Swab  Result Value Ref Range Status   SARS Coronavirus 2 NEGATIVE NEGATIVE Final    Comment: (NOTE) SARS-CoV-2  target nucleic acids are NOT DETECTED.  The SARS-CoV-2 RNA is generally detectable in upper and lower respiratory specimens during the acute phase of infection. The lowest concentration of SARS-CoV-2 viral copies this assay can detect is 250 copies / mL. A negative result does not preclude SARS-CoV-2 infection and should not be used as the sole basis for treatment or other patient management decisions.  A negative result may occur with improper specimen collection / handling, submission of specimen other than nasopharyngeal swab, presence of viral mutation(s) within the areas targeted by this assay, and inadequate number of viral copies (<250 copies / mL). A negative result must be combined with clinical observations, patient history, and epidemiological information.  Fact Sheet for Patients:   StrictlyIdeas.no  Fact Sheet for Healthcare Providers: BankingDealers.co.za  This test is not yet approved or  cleared by the [place] FDA and has been authorized for detection and/or diagnosis of SARS-CoV-2 by FDA under an Emergency Use Authorization (EUA).  This EUA will remain in effect (meaning this test can be used) for the duration of the COVID-19 declaration under Section 564(b)(1) of the Act, 21 U.S.C. section 360bbb-3(b)(1), unless the authorization is terminated or revoked sooner.  Performed at [facility], [address] [place], [place] [address]      Labs:   CBC: Recent Labs  Lab 05/03/20 0100 05/03/20 0119 05/03/20 0415  WBC 11.4*  --  10.9*  NEUTROABS 9.2*  --   --   HGB 13.1 13.3 12.2  HCT 40.4 39.0 37.1  MCV 88.4  --  88.3  PLT 411*  --  253   Basic Metabolic Panel: Recent Labs  Lab 05/03/20 0100 05/03/20 0119 05/03/20 0415  NA 138 141 137  K 3.5 3.7 3.8  CL 103 103 107  CO2 26  --  21*  GLUCOSE 118* 119* 118*  BUN 7 6 6   CREATININE 0.68 0.60 0.57  CALCIUM 9.4  --  8.6*   Liver Function  Tests: Recent Labs  Lab 05/03/20 0415  AST 15  ALT 13  ALKPHOS 92  BILITOT 0.3  PROT 7.2  ALBUMIN 3.6   BNP (last 3 results) No results for input(s): BNP in the last 8760 hours. Cardiac Enzymes: No results for input(s): CKTOTAL, CKMB, CKMBINDEX, TROPONINI in the last 168 hours. CBG: Recent Labs  Lab 05/03/20 0048  GLUCAP 95   Hgb A1c No results for input(s): HGBA1C in the last 72 hours. Lipid Profile No results for input(s): CHOL, HDL, LDLCALC, TRIG, CHOLHDL, LDLDIRECT in the last 72 hours. Thyroid function studies No results for input(s): TSH, T4TOTAL, T3FREE, THYROIDAB in the last 72 hours.  Invalid input(s): FREET3 Anemia work up No results for input(s): VITAMINB12, FOLATE, FERRITIN, TIBC, IRON, RETICCTPCT in the last 72 hours. Urinalysis    Component Value Date/Time   COLORURINE STRAW (A) 05/03/2020 0205   APPEARANCEUR CLEAR 05/03/2020 0205   LABSPEC 1.026 05/03/2020 0205   PHURINE 7.0 05/03/2020 0205   GLUCOSEU NEGATIVE 05/03/2020 0205   HGBUR NEGATIVE 05/03/2020 0205   BILIRUBINUR NEGATIVE 05/03/2020 0205   KETONESUR NEGATIVE 05/03/2020 0205   PROTEINUR NEGATIVE 05/03/2020 0205   UROBILINOGEN 0.2 09/16/2013 1327   NITRITE NEGATIVE 05/03/2020 0205   LEUKOCYTESUR  SMALL (A) 05/03/2020 0205         Time coordinating discharge: Over 45 minutes  SIGNED: Deatra James, MD, FACP, [facility]. [facility],  Please use [email or web address] to Page If 7PM-7AM, please contact night-coverage [email or web address] 05/03/2020, 9:23 AM

## 2020-05-03 NOTE — ED Notes (Signed)
Code Stroke cancelled at this time.

## 2020-05-03 NOTE — Progress Notes (Signed)
*  PRELIMINARY RESULTS* Echocardiogram 2D Echocardiogram has been performed.  [name] 05/03/2020, 9:45 AM

## 2020-05-03 NOTE — ED Notes (Signed)
[place] paged @ [phone]

## 2020-05-03 NOTE — Progress Notes (Signed)
Code stroke time documentation: 111 Call time 108 Beeper time 118 Exam Started 122 Exam finished 122 Images sent to [facility] 123 Exam completed in [facility] called

## 2020-05-03 NOTE — H&P (Signed)
TRH H&P    Patient Demographics:    [name], is a 44 y.o. female  MRN: [id number]  DOB - 04/29/1976  Admit Date - 05/03/2020  Referring MD/NP/PA: Carleene Overlie  Outpatient Primary MD for the patient is Lemmie Evens, MD  Patient coming from: Home  Chief complaint-dizziness   HPI:    [name]  is a 44 y.o. female, with no significant medical problem came to ED when patient had episode of lightheadedness while she was driving back home around 10:15 PM. Patient said that she had a sudden onset of lightheadedness along with nausea. Denies room spinning. Never had episode like this before. Patient had vomiting when she arrived in the ED. Denies blurred vision. Denies weakness of extremities. Denies numbness. Denies any vision changes. No slurred speech. In the ED patient was evaluated by tele neurologist, and was advised to obtain MRI brain to rule out stroke. He denies chest pain or shortness of breath Denies dysuria, fever or chills. No previous history of stroke or seizures.    Review of systems:    In addition to the HPI above,    All other systems reviewed and are negative.    Past History of the following :    Past Medical History:  Diagnosis Date  . Anemia   . GERD (gastroesophageal reflux disease)   . Helicobacter pylori (H. pylori)    prevpac  . Hemorrhoids       Past Surgical History:  Procedure Laterality Date  . ABDOMINAL HYSTERECTOMY    . APPENDECTOMY     44 years old  . ESOPHAGOGASTRODUODENOSCOPY  09/14/2007   Dr. Hurley Cisco quadrant distal esophageal erosion consistant with  moderately severe erosive reflux esophagitits, o/w normal esophagus.,tiny antral erosions o/w normal stomach, inflammation on bx  . ESOPHAGOGASTRODUODENOSCOPY (EGD) WITH ESOPHAGEAL DILATION  12/09/2012   Dr. Gala Romney- normal esophagus s/p maloney dilation, small hiatal hernia  .  SALPINGOOPHORECTOMY Right 06/03/2017   Procedure: SALPINGO OOPHORECTOMY;  Surgeon: Florian Buff, MD;  Location: [facility];  Service: Gynecology;  Laterality: Right;  . SUPRACERVICAL ABDOMINAL HYSTERECTOMY N/A 06/03/2017   Procedure: HYSTERECTOMY SUPRACERVICAL ABDOMINAL;  Surgeon: Florian Buff, MD;  Location: [facility];  Service: Gynecology;  Laterality: N/A;  . UNILATERAL SALPINGECTOMY Left 06/03/2017   Procedure: left salpingectomy;  Surgeon: Florian Buff, MD;  Location: [facility];  Service: Gynecology;  Laterality: Left;      Social History:      Social History   Tobacco Use  . Smoking status: Never Smoker  . Smokeless tobacco: Never Used  Substance Use Topics  . Alcohol use: No       Family History :   No family history of stroke or seizures. No history of cancer   Home Medications:   Prior to Admission medications   Medication Sig Start Date End Date Taking? Authorizing Provider  albuterol (PROVENTIL HFA;VENTOLIN HFA) 108 (90 Base) MCG/ACT inhaler Inhale into the lungs every 6 (six) hours as needed for wheezing or shortness of breath.    [provider]  cetirizine (ZYRTEC) 10 MG tablet Take 10 mg by mouth daily as needed for allergies.     [provider]  Multiple Vitamins-Minerals (MULTIVITAMIN ADULT PO) Take 1 tablet by mouth as needed.     [provider]  omeprazole (PRILOSEC) 20 MG capsule Take 20 mg as needed by mouth.     [provider]  triamcinolone cream (KENALOG) 0.1 % Apply 1 application topically 2 (two) times daily as needed (eczema).     [provider]     Allergies:     Allergies  Allergen Reactions  . Tamiflu [Oseltamivir Phosphate] Other (See Comments)    Anxiety, insomnia, shaky, vomiting  . Levofloxacin   . Sulfonamide Derivatives Hives     Physical Exam:   Vitals  Blood pressure 118/76, pulse 84, temperature 98.5 F (36.9 C), temperature source Oral, resp. rate (!) 24, height 5\' 2"  (1.575 m),  weight 61.2 kg, last menstrual period 06/03/2017, SpO2 100 %.  1.  General: Appears in no acute distress  2. Psychiatric: Alert, oriented x3, intact insight and judgment  3. Neurologic: Cranial nerves II through XII grossly intact, no focal deficit noted, motor strength 5/5 in all extremities, DTRs 2+ bilaterally  4. HEENMT:  Atraumatic normocephalic, extraocular muscles are intact  5. Respiratory : Clear to auscultation bilaterally, no wheezing or crackles auscultated  6. Cardiovascular : S1-S2, regular, no murmur auscultated  7. Gastrointestinal:  Abdomen is soft, nontender, no organomegaly      Data Review:    CBC Recent Labs  Lab 05/03/20 0100 05/03/20 0119  WBC 11.4*  --   HGB 13.1 13.3  HCT 40.4 39.0  PLT 411*  --   MCV 88.4  --   MCH 28.7  --   MCHC 32.4  --   RDW 12.9  --   LYMPHSABS 1.4  --   MONOABS 0.5  --   EOSABS 0.2  --   BASOSABS 0.1  --    ------------------------------------------------------------------------------------------------------------------  Results for orders placed or performed during the hospital encounter of 05/03/20 (from the past 48 hour(s))  CBG monitoring, ED     Status: None   Collection Time: 05/03/20 12:48 AM  Result Value Ref Range   Glucose-Capillary 95 70 - 99 mg/dL    Comment: Glucose reference range applies only to samples taken after fasting for at least 8 hours.  CBC with Differential/Platelet     Status: Abnormal   Collection Time: 05/03/20  1:00 AM  Result Value Ref Range   WBC 11.4 (H) 4.0 - 10.5 K/uL   RBC 4.57 3.87 - 5.11 MIL/uL   Hemoglobin 13.1 12.0 - 15.0 g/dL   HCT 40.4 36 - 46 %   MCV 88.4 80.0 - 100.0 fL   MCH 28.7 26.0 - 34.0 pg   MCHC 32.4 30.0 - 36.0 g/dL   RDW 12.9 11.5 - 15.5 %   Platelets 411 (H) 150 - 400 K/uL   nRBC 0.0 0.0 - 0.2 %   Neutrophils Relative % 80 %   Neutro Abs 9.2 (H) 1.7 - 7.7 K/uL   Lymphocytes Relative 12 %   Lymphs Abs 1.4 0.7 - 4.0 K/uL   Monocytes Relative 5 %    Monocytes Absolute 0.5 0 - 1 K/uL   Eosinophils Relative 2 %   Eosinophils Absolute 0.2 0 - 0 K/uL   Basophils Relative 1 %   Basophils Absolute 0.1 0 - 0 K/uL   Immature Granulocytes 0 %   Abs Immature  Granulocytes 0.03 0.00 - 0.07 K/uL    Comment: Performed at [facility], [address], [place], [place] [address]  Basic metabolic panel     Status: Abnormal   Collection Time: 05/03/20  1:00 AM  Result Value Ref Range   Sodium 138 135 - 145 mmol/L   Potassium 3.5 3.5 - 5.1 mmol/L   Chloride 103 98 - 111 mmol/L   CO2 26 22 - 32 mmol/L   Glucose, Bld 118 (H) 70 - 99 mg/dL    Comment: Glucose reference range applies only to samples taken after fasting for at least 8 hours.   BUN 7 6 - 20 mg/dL   Creatinine, Ser 0.68 0.44 - 1.00 mg/dL   Calcium 9.4 8.9 - 10.3 mg/dL   GFR calc non Af Amer >60 >60 mL/min   GFR calc Af Amer >60 >60 mL/min   Anion gap 9 5 - 15    Comment: Performed at [facility], [address], [place], [place] [address]  Troponin I (High Sensitivity)     Status: None   Collection Time: 05/03/20  1:00 AM  Result Value Ref Range   Troponin I (High Sensitivity) <2 <18 ng/L    Comment: (NOTE) Elevated high sensitivity troponin I (hsTnI) values and significant  changes across serial measurements may suggest ACS but many other  chronic and acute conditions are known to elevate hsTnI results.  Refer to the "Links" section for chest pain algorithms and additional  guidance. Performed at [facility], [address], [place], [place] [address]   hCG, serum, qualitative     Status: None   Collection Time: 05/03/20  1:00 AM  Result Value Ref Range   Preg, Serum NEGATIVE NEGATIVE    Comment:        THE SENSITIVITY OF THIS METHODOLOGY IS >10 mIU/mL. Performed at [facility], [address], [place], [place] [address]   Ethanol     Status: None   Collection Time: 05/03/20  1:00 AM  Result Value Ref Range   Alcohol, Ethyl (B) <10 <10 mg/dL    Comment: (NOTE) Lowest  detectable limit for serum alcohol is 10 mg/dL.  For medical purposes only. Performed at [facility], [address], [place], [place] [address]   Protime-INR     Status: None   Collection Time: 05/03/20  1:00 AM  Result Value Ref Range   Prothrombin Time 12.7 11.4 - 15.2 seconds   INR 1.0 0.8 - 1.2    Comment: (NOTE) INR goal varies based on device and disease states. Performed at [facility], [address], [place], [place] [address]   APTT     Status: None   Collection Time: 05/03/20  1:00 AM  Result Value Ref Range   aPTT 29 24 - 36 seconds    Comment: Performed at [facility], [address] [place], [place] [address]  I-stat chem 8, ED     Status: Abnormal   Collection Time: 05/03/20  1:19 AM  Result Value Ref Range   Sodium 141 135 - 145 mmol/L   Potassium 3.7 3.5 - 5.1 mmol/L   Chloride 103 98 - 111 mmol/L   BUN 6 6 - 20 mg/dL   Creatinine, Ser 0.60 0.44 - 1.00 mg/dL   Glucose, Bld 119 (H) 70 - 99 mg/dL    Comment: Glucose reference range applies only to samples taken after fasting for at least 8 hours.   Calcium, Ion 1.29 1.15 - 1.40 mmol/L  TCO2 27 22 - 32 mmol/L   Hemoglobin 13.3 12.0 - 15.0 g/dL   HCT 39.0 36 - 46 %  Urine rapid drug screen (hosp performed)     Status: None   Collection Time: 05/03/20  2:05 AM  Result Value Ref Range   Opiates NONE DETECTED NONE DETECTED   Cocaine NONE DETECTED NONE DETECTED   Benzodiazepines NONE DETECTED NONE DETECTED   Amphetamines NONE DETECTED NONE DETECTED   Tetrahydrocannabinol NONE DETECTED NONE DETECTED   Barbiturates NONE DETECTED NONE DETECTED    Comment: (NOTE) DRUG SCREEN FOR MEDICAL PURPOSES ONLY.  IF CONFIRMATION IS NEEDED FOR ANY PURPOSE, NOTIFY LAB WITHIN 5 DAYS.  LOWEST DETECTABLE LIMITS FOR URINE DRUG SCREEN Drug Class                     Cutoff (ng/mL) Amphetamine and metabolites    1000 Barbiturate and metabolites    200 Benzodiazepine                 676 Tricyclics and metabolites      300 Opiates and metabolites        300 Cocaine and metabolites        300 THC                            50 Performed at [facility] [place], [place] [address]   Urinalysis, Routine w reflex microscopic     Status: Abnormal   Collection Time: 05/03/20  2:05 AM  Result Value Ref Range   Color, Urine STRAW (A) YELLOW   APPearance CLEAR CLEAR   Specific Gravity, Urine 1.026 1.005 - 1.030   pH 7.0 5.0 - 8.0   Glucose, UA NEGATIVE NEGATIVE mg/dL   Hgb urine dipstick NEGATIVE NEGATIVE   Bilirubin Urine NEGATIVE NEGATIVE   Ketones, ur NEGATIVE NEGATIVE mg/dL   Protein, ur NEGATIVE NEGATIVE mg/dL   Nitrite NEGATIVE NEGATIVE   Leukocytes,Ua SMALL (A) NEGATIVE   RBC / HPF 0-5 0 - 5 RBC/hpf   WBC, UA 0-5 0 - 5 WBC/hpf   Bacteria, UA NONE SEEN NONE SEEN   Squamous Epithelial / LPF 0-5 0 - 5    Comment: Performed at [facility], [address], [place], [place] [address]  POC urine preg, ED     Status: None   Collection Time: 05/03/20  2:07 AM  Result Value Ref Range   Preg Test, Ur NEGATIVE NEGATIVE    Comment:        THE SENSITIVITY OF THIS METHODOLOGY IS >24 mIU/mL   Troponin I (High Sensitivity)     Status: None   Collection Time: 05/03/20  3:16 AM  Result Value Ref Range   Troponin I (High Sensitivity) <2 <18 ng/L    Comment: (NOTE) Elevated high sensitivity troponin I (hsTnI) values and significant  changes across serial measurements may suggest ACS but many other  chronic and acute conditions are known to elevate hsTnI results.  Refer to the "Links" section for chest pain algorithms and additional  guidance. Performed at [facility], [address] [place], [place] [address]     Chemistries  Recent Labs  Lab 05/03/20 0100 05/03/20 0119  NA 138 141  K 3.5 3.7  CL 103 103  CO2 26  --   GLUCOSE 118* 119*  BUN 7 6  CREATININE 0.68 0.60  CALCIUM 9.4  --     ------------------------------------------------------------------------------------------------------------------  ------------------------------------------------------------------------------------------------------------------  GFR: Estimated Creatinine Clearance: 78 mL/min (by C-G formula based on SCr of 0.6 mg/dL). Liver Function Tests: No results for input(s): AST, ALT, ALKPHOS, BILITOT, PROT, ALBUMIN in the last 168 hours. No results for input(s): LIPASE, AMYLASE in the last 168 hours. No results for input(s): AMMONIA in the last 168 hours. Coagulation Profile: Recent Labs  Lab 05/03/20 0100  INR 1.0   Cardiac Enzymes: No results for input(s): CKTOTAL, CKMB, CKMBINDEX, TROPONINI in the last 168 hours. BNP (last 3 results) No results for input(s): PROBNP in the last 8760 hours. HbA1C: No results for input(s): HGBA1C in the last 72 hours. CBG: Recent Labs  Lab 05/03/20 0048  GLUCAP 95   Lipid Profile: No results for input(s): CHOL, HDL, LDLCALC, TRIG, CHOLHDL, LDLDIRECT in the last 72 hours. Thyroid Function Tests: No results for input(s): TSH, T4TOTAL, FREET4, T3FREE, THYROIDAB in the last 72 hours. Anemia Panel: No results for input(s): VITAMINB12, FOLATE, FERRITIN, TIBC, IRON, RETICCTPCT in the last 72 hours.  --------------------------------------------------------------------------------------------------------------- Urine analysis:    Component Value Date/Time   COLORURINE STRAW (A) 05/03/2020 0205   APPEARANCEUR CLEAR 05/03/2020 0205   LABSPEC 1.026 05/03/2020 0205   PHURINE 7.0 05/03/2020 0205   GLUCOSEU NEGATIVE 05/03/2020 0205   HGBUR NEGATIVE 05/03/2020 0205   BILIRUBINUR NEGATIVE 05/03/2020 0205   KETONESUR NEGATIVE 05/03/2020 0205   PROTEINUR NEGATIVE 05/03/2020 0205   UROBILINOGEN 0.2 09/16/2013 1327   NITRITE NEGATIVE 05/03/2020 0205   LEUKOCYTESUR SMALL (A) 05/03/2020 0205      Imaging Results:    CT Angio Head W or Wo  Contrast  Result Date: 05/03/2020 CLINICAL DATA:  Dizziness and ataxia EXAM: CT ANGIOGRAPHY HEAD AND NECK CT PERFUSION BRAIN TECHNIQUE: Multidetector CT imaging of the head and neck was performed using the standard protocol during bolus administration of intravenous contrast. Multiplanar CT image reconstructions and MIPs were obtained to evaluate the vascular anatomy. Carotid stenosis measurements (when applicable) are obtained utilizing NASCET criteria, using the distal internal carotid diameter as the denominator. Multiphase CT imaging of the brain was performed following IV bolus contrast injection. Subsequent parametric perfusion maps were calculated using RAPID software. CONTRAST:  114mL OMNIPAQUE IOHEXOL 350 MG/ML SOLN COMPARISON:  None. FINDINGS: CTA NECK FINDINGS SKELETON: There is no bony spinal canal stenosis. No lytic or blastic lesion. OTHER NECK: Normal pharynx, larynx and major salivary glands. No cervical lymphadenopathy. Unremarkable thyroid gland. UPPER CHEST: No pneumothorax or pleural effusion. No nodules or masses. AORTIC ARCH: There is no calcific atherosclerosis of the aortic arch. There is no aneurysm, dissection or hemodynamically significant stenosis of the visualized portion of the aorta. Conventional 3 vessel aortic branching pattern. The visualized proximal subclavian arteries are widely patent. RIGHT CAROTID SYSTEM: Normal without aneurysm, dissection or stenosis. LEFT CAROTID SYSTEM: Normal without aneurysm, dissection or stenosis. VERTEBRAL ARTERIES: Left dominant configuration. Both origins are clearly patent. There is no dissection, occlusion or flow-limiting stenosis to the skull base (V1-V3 segments). CTA HEAD FINDINGS POSTERIOR CIRCULATION: --Vertebral arteries: Normal V4 segments. --Inferior cerebellar arteries: Normal. --Basilar artery: Normal. --Superior cerebellar arteries: Normal. --Posterior cerebral arteries (PCA): Normal. ANTERIOR CIRCULATION: --Intracranial internal  carotid arteries: Normal. --Anterior cerebral arteries (ACA): Normal. Both A1 segments are present. Patent anterior communicating artery (a-comm). --Middle cerebral arteries (MCA): Normal. VENOUS SINUSES: As permitted by contrast timing, patent. ANATOMIC VARIANTS: None Review of the MIP images confirms the above findings. CT Brain Perfusion Findings: ASPECTS: 10 CBF (<30%) Volume: 86mL Perfusion (Tmax>6.0s) volume: 22mL Mismatch Volume: 34mL Infarction Location:None IMPRESSION: 1. Normal CTA  of the head and neck. 2. Normal CT perfusion. Electronically Signed   By: Ulyses Jarred M.D.   On: 05/03/2020 01:48   CT Angio Neck W and/or Wo Contrast  Result Date: 05/03/2020 CLINICAL DATA:  Dizziness and ataxia EXAM: CT ANGIOGRAPHY HEAD AND NECK CT PERFUSION BRAIN TECHNIQUE: Multidetector CT imaging of the head and neck was performed using the standard protocol during bolus administration of intravenous contrast. Multiplanar CT image reconstructions and MIPs were obtained to evaluate the vascular anatomy. Carotid stenosis measurements (when applicable) are obtained utilizing NASCET criteria, using the distal internal carotid diameter as the denominator. Multiphase CT imaging of the brain was performed following IV bolus contrast injection. Subsequent parametric perfusion maps were calculated using RAPID software. CONTRAST:  139mL OMNIPAQUE IOHEXOL 350 MG/ML SOLN COMPARISON:  None. FINDINGS: CTA NECK FINDINGS SKELETON: There is no bony spinal canal stenosis. No lytic or blastic lesion. OTHER NECK: Normal pharynx, larynx and major salivary glands. No cervical lymphadenopathy. Unremarkable thyroid gland. UPPER CHEST: No pneumothorax or pleural effusion. No nodules or masses. AORTIC ARCH: There is no calcific atherosclerosis of the aortic arch. There is no aneurysm, dissection or hemodynamically significant stenosis of the visualized portion of the aorta. Conventional 3 vessel aortic branching pattern. The visualized proximal  subclavian arteries are widely patent. RIGHT CAROTID SYSTEM: Normal without aneurysm, dissection or stenosis. LEFT CAROTID SYSTEM: Normal without aneurysm, dissection or stenosis. VERTEBRAL ARTERIES: Left dominant configuration. Both origins are clearly patent. There is no dissection, occlusion or flow-limiting stenosis to the skull base (V1-V3 segments). CTA HEAD FINDINGS POSTERIOR CIRCULATION: --Vertebral arteries: Normal V4 segments. --Inferior cerebellar arteries: Normal. --Basilar artery: Normal. --Superior cerebellar arteries: Normal. --Posterior cerebral arteries (PCA): Normal. ANTERIOR CIRCULATION: --Intracranial internal carotid arteries: Normal. --Anterior cerebral arteries (ACA): Normal. Both A1 segments are present. Patent anterior communicating artery (a-comm). --Middle cerebral arteries (MCA): Normal. VENOUS SINUSES: As permitted by contrast timing, patent. ANATOMIC VARIANTS: None Review of the MIP images confirms the above findings. CT Brain Perfusion Findings: ASPECTS: 10 CBF (<30%) Volume: 85mL Perfusion (Tmax>6.0s) volume: 39mL Mismatch Volume: 27mL Infarction Location:None IMPRESSION: 1. Normal CTA of the head and neck. 2. Normal CT perfusion. Electronically Signed   By: Ulyses Jarred M.D.   On: 05/03/2020 01:48   CT CEREBRAL PERFUSION W CONTRAST  Result Date: 05/03/2020 CLINICAL DATA:  Dizziness and ataxia EXAM: CT ANGIOGRAPHY HEAD AND NECK CT PERFUSION BRAIN TECHNIQUE: Multidetector CT imaging of the head and neck was performed using the standard protocol during bolus administration of intravenous contrast. Multiplanar CT image reconstructions and MIPs were obtained to evaluate the vascular anatomy. Carotid stenosis measurements (when applicable) are obtained utilizing NASCET criteria, using the distal internal carotid diameter as the denominator. Multiphase CT imaging of the brain was performed following IV bolus contrast injection. Subsequent parametric perfusion maps were calculated using  RAPID software. CONTRAST:  142mL OMNIPAQUE IOHEXOL 350 MG/ML SOLN COMPARISON:  None. FINDINGS: CTA NECK FINDINGS SKELETON: There is no bony spinal canal stenosis. No lytic or blastic lesion. OTHER NECK: Normal pharynx, larynx and major salivary glands. No cervical lymphadenopathy. Unremarkable thyroid gland. UPPER CHEST: No pneumothorax or pleural effusion. No nodules or masses. AORTIC ARCH: There is no calcific atherosclerosis of the aortic arch. There is no aneurysm, dissection or hemodynamically significant stenosis of the visualized portion of the aorta. Conventional 3 vessel aortic branching pattern. The visualized proximal subclavian arteries are widely patent. RIGHT CAROTID SYSTEM: Normal without aneurysm, dissection or stenosis. LEFT CAROTID SYSTEM: Normal without aneurysm, dissection or stenosis. VERTEBRAL ARTERIES:  Left dominant configuration. Both origins are clearly patent. There is no dissection, occlusion or flow-limiting stenosis to the skull base (V1-V3 segments). CTA HEAD FINDINGS POSTERIOR CIRCULATION: --Vertebral arteries: Normal V4 segments. --Inferior cerebellar arteries: Normal. --Basilar artery: Normal. --Superior cerebellar arteries: Normal. --Posterior cerebral arteries (PCA): Normal. ANTERIOR CIRCULATION: --Intracranial internal carotid arteries: Normal. --Anterior cerebral arteries (ACA): Normal. Both A1 segments are present. Patent anterior communicating artery (a-comm). --Middle cerebral arteries (MCA): Normal. VENOUS SINUSES: As permitted by contrast timing, patent. ANATOMIC VARIANTS: None Review of the MIP images confirms the above findings. CT Brain Perfusion Findings: ASPECTS: 10 CBF (<30%) Volume: 58mL Perfusion (Tmax>6.0s) volume: 36mL Mismatch Volume: 44mL Infarction Location:None IMPRESSION: 1. Normal CTA of the head and neck. 2. Normal CT perfusion. Electronically Signed   By: Ulyses Jarred M.D.   On: 05/03/2020 01:48   CT HEAD CODE STROKE WO CONTRAST  Result Date:  05/03/2020 CLINICAL DATA:  Code stroke.  Vertigo EXAM: CT HEAD WITHOUT CONTRAST TECHNIQUE: Contiguous axial images were obtained from the base of the skull through the vertex without intravenous contrast. COMPARISON:  None. FINDINGS: Brain: There is no mass, hemorrhage or extra-axial collection. The size and configuration of the ventricles and extra-axial CSF spaces are normal. The brain parenchyma is normal, without evidence of acute or chronic infarction. Vascular: No abnormal hyperdensity of the major intracranial arteries or dural venous sinuses. No intracranial atherosclerosis. Skull: The visualized skull base, calvarium and extracranial soft tissues are normal. Sinuses/Orbits: No fluid levels or advanced mucosal thickening of the visualized paranasal sinuses. No mastoid or middle ear effusion. The orbits are normal. ASPECTS [facility] Stroke Program Early CT Score) - Ganglionic level infarction (caudate, lentiform nuclei, internal capsule, insula, M1-M3 cortex): 7 - Supraganglionic infarction (M4-M6 cortex): 3 Total score (0-10 with 10 being normal): 10 IMPRESSION: 1. Normal head CT. 2. ASPECTS is 10. These results were called by telephone at the time of interpretation on 05/03/2020 at 1:33 am to provider [facility] , who verbally acknowledged these results. Electronically Signed   By: Ulyses Jarred M.D.   On: 05/03/2020 01:33    My personal review of EKG: Rhythm NSR, no ST changes   Assessment & Plan:    Active Problems:   Dizziness   1. Dizziness-unclear etiology, BPPV versus stroke. Placed under observation, will obtain MRI brain in a.m. Monitor on telemetry. CTA head and neck is negative for acute abnormality. Will obtain echocardiogram in a.m. If MRI brain is negative, consider PT evaluation for BPPV. 1 dose of aspirin given in the ED. Start meclizine 25 mg 3 times daily as needed.   DVT Prophylaxis-   Lovenox   AM Labs Ordered, also please review Full Orders  Family Communication:  Admission, patients condition and plan of care including tests being ordered have been discussed with the patient  who indicate understanding and agree with the plan and Code Status.  Code Status: Full code  Admission status: Observation Time spent in minutes : 60 minutes   Aurther Harlin S Kallen Delatorre M.D

## 2020-05-30 ENCOUNTER — Encounter (HOSPITAL_COMMUNITY): Payer: Self-pay | Admitting: Physical Therapy

## 2020-05-30 ENCOUNTER — Ambulatory Visit (HOSPITAL_COMMUNITY): Payer: BC Managed Care – PPO | Attending: Family Medicine | Admitting: Physical Therapy

## 2020-05-30 ENCOUNTER — Other Ambulatory Visit: Payer: Self-pay

## 2020-05-30 DIAGNOSIS — R2689 Other abnormalities of gait and mobility: Secondary | ICD-10-CM

## 2020-05-30 DIAGNOSIS — R42 Dizziness and giddiness: Secondary | ICD-10-CM | POA: Diagnosis present

## 2020-05-30 NOTE — Therapy (Signed)
[place], [place], [address] Phone: [phone]   Fax:  [phone]  Physical Therapy Evaluation  Patient Details  Name: [name] MRN: [id number] Date of Birth: Oct 26, 1976 Referring Provider (PT): Skipper Cliche MD   Encounter Date: 05/30/2020   PT End of Session - 05/30/20 1519    Visit Number 1    Number of Visits 1    Date for PT Re-Evaluation 05/30/20    Authorization Type [employer] (no auth, VL 30)    PT Start Time 1432    PT Stop Time 1512    PT Time Calculation (min) 40 min    Activity Tolerance Patient tolerated treatment well    Behavior During Therapy [facility] for tasks assessed/performed           Past Medical History:  Diagnosis Date  . Anemia   . GERD (gastroesophageal reflux disease)   . Helicobacter pylori (H. pylori)    prevpac  . Hemorrhoids     Past Surgical History:  Procedure Laterality Date  . ABDOMINAL HYSTERECTOMY    . APPENDECTOMY     44 years old  . ESOPHAGOGASTRODUODENOSCOPY  09/14/2007   Dr. Hurley Cisco quadrant distal esophageal erosion consistant with  moderately severe erosive reflux esophagitits, o/w normal esophagus.,tiny antral erosions o/w normal stomach, inflammation on bx  . ESOPHAGOGASTRODUODENOSCOPY (EGD) WITH ESOPHAGEAL DILATION  12/09/2012   Dr. Gala Romney- normal esophagus s/p maloney dilation, small hiatal hernia  . SALPINGOOPHORECTOMY Right 06/03/2017   Procedure: SALPINGO OOPHORECTOMY;  Surgeon: Florian Buff, MD;  Location: [facility];  Service: Gynecology;  Laterality: Right;  . SUPRACERVICAL ABDOMINAL HYSTERECTOMY N/A 06/03/2017   Procedure: HYSTERECTOMY SUPRACERVICAL ABDOMINAL;  Surgeon: Florian Buff, MD;  Location: [facility];  Service: Gynecology;  Laterality: N/A;  . UNILATERAL SALPINGECTOMY Left 06/03/2017   Procedure: left salpingectomy;  Surgeon: Florian Buff, MD;  Location: [facility];  Service: Gynecology;  Laterality: Left;    There were no vitals filed for  this visit.    Subjective Assessment - 05/30/20 1432    Subjective Patient is a 44 y.o. female who present to physical therapy with c/o unsteady gait. Patient states she went to the hospital about 3 weeks ago. She stated she was lightheaded and was dizzy and when she got to the ED she vomited. They did a bunch of tests and think she may have had an inner ear infection. She took prednisone given by her MD and she started feeling better several days later. More of a light headedness vs dizziness. She had a little bit of the lightheadness yesterday. Her ear felt like it was plugged up. She notes lightheadedness with occasionally turning to the right. She has not had any falls.    Currently in Pain? No/denies              [facility] PT Assessment - 05/30/20 0001      Assessment   Medical Diagnosis Unsteady Gait    Referring Provider (PT) Skipper Cliche MD    Next MD Visit none    Prior Therapy none      Precautions   Precautions None      Restrictions   Weight Bearing Restrictions No      Balance Screen   Has the patient fallen in the past 6 months No    Has the patient had a decrease in activity level because of a fear of falling?  No    Is the patient reluctant to  leave their home because of a fear of falling?  No      Prior Function   Level of Independence Independent      Cognition   Overall Cognitive Status Within Functional Limits for tasks assessed                  Vestibular Assessment - 05/30/20 0001      Symptom Behavior   Type of Dizziness  Lightheadedness    History of similar episodes No      Oculomotor Exam   Oculomotor Alignment Normal    Ocular ROM WFL    Spontaneous Absent    Gaze-induced  Absent    Head shaking Horizontal Absent    Head Shaking Vertical Absent    Smooth Pursuits Intact    Saccades Intact    Comment VBI testing negative      Oculomotor Exam-Fixation Suppressed    Left Head Impulse negative    Right Head Impulse negative        Vestibulo-Ocular Reflex   VOR 1 Head Only (x 1 viewing) negative      Positional Testing   Dix-Hallpike Dix-Hallpike Right;Dix-Hallpike Left    Sidelying Test Sidelying Right;Sidelying Left      Dix-Hallpike Right   Dix-Hallpike Right Symptoms No nystagmus      Dix-Hallpike Left   Dix-Hallpike Left Symptoms No nystagmus      Positional Sensitivities   Sit to Supine No dizziness    Supine to Sitting No dizziness    Right Hallpike No dizziness    Up from Right Hallpike No dizziness    Up from Left Hallpike No dizziness              Objective measurements completed on examination: See above findings.               PT Education - 05/30/20 1431    Education Details Patient educated on exam findings, POC, scope of PT, central vs peripheral impairments, VBI, BPPV    Person(s) Educated Patient    Methods Explanation;Demonstration    Comprehension Verbalized understanding;Returned demonstration            PT Short Term Goals - 05/30/20 1614      PT SHORT TERM GOAL #1   Title Patient will be educated on exam findings, pathology, vestibular impairments, and returning to PT if necessary    Time 1    Period Days    Status Achieved    Target Date 05/30/20                     Plan - 05/30/20 1603    Clinical Impression Statement Patient is a 44 y.o. female who present to physical therapy with c/o unsteady gait which has resolved and intermittent lightheadness with right head turns. No symptoms were elicited today with vestibular testing, oculomotor testing, or vertebral artery testing. Patient did not demonstrate any abnormalities with oculomotor testing or with positional changes. Patient to follow up with ENT in several weeks to further assess inner ear she states. Patient educated returning to physical therapy if needed and common vestibular pathology. Examination today did not reveal any further physical therapy needs at this time.    Personal Factors  and Comorbidities Time since onset of injury/illness/exacerbation    Examination-Activity Limitations Other   turning head   Stability/Clinical Decision Making Stable/Uncomplicated    Clinical Decision Making Low    Rehab Potential Fair    PT Frequency One  time visit    PT Treatment/Interventions Gait training;Stair training;Functional mobility training;Therapeutic activities;Therapeutic exercise;Balance training;Neuromuscular re-education;Canalith Repostioning;DME Instruction;Passive range of motion;Manual techniques;Vestibular;Spinal Manipulations;Joint Manipulations    PT Next Visit Plan n/a    Consulted and Agree with Plan of Care Patient           Patient will benefit from skilled therapeutic intervention in order to improve the following deficits and impairments:  Decreased balance, Impaired sensation, Dizziness  Visit Diagnosis: Other abnormalities of gait and mobility  Dizziness and giddiness     Problem List Patient Active Problem List   Diagnosis Date Noted  . Dizziness 05/03/2020  . Monilial vaginitis 09/20/2017  . S/P abdominal supracervical subtotal hysterectomy 06/03/2017  . GERD 03/07/2010  . EPIGASTRIC PAIN 03/07/2010  . HELICOBACTER PYLORI INFECTION, HX OF 03/07/2010    4:15 PM, 05/30/20 Mearl Latin PT, DPT Physical Therapist at [facility] [place], [place], [address] Phone: [phone]   Fax:  [phone]  Name: [name] MRN: [id number] Date of Birth: 12/27/1975

## 2021-07-22 ENCOUNTER — Other Ambulatory Visit: Payer: Self-pay

## 2021-07-22 ENCOUNTER — Encounter (HOSPITAL_COMMUNITY): Payer: Self-pay | Admitting: Emergency Medicine

## 2021-07-22 ENCOUNTER — Emergency Department (HOSPITAL_COMMUNITY)
Admission: EM | Admit: 2021-07-22 | Discharge: 2021-07-22 | Disposition: A | Discharge status: Home / Self Care | Payer: BC Managed Care – PPO | Attending: Emergency Medicine | Admitting: Emergency Medicine

## 2021-07-22 DIAGNOSIS — Z20822 Contact with and (suspected) exposure to covid-19: Secondary | ICD-10-CM | POA: Diagnosis not present

## 2021-07-22 DIAGNOSIS — H9201 Otalgia, right ear: Secondary | ICD-10-CM | POA: Diagnosis present

## 2021-07-22 DIAGNOSIS — H6991 Unspecified Eustachian tube disorder, right ear: Secondary | ICD-10-CM | POA: Insufficient documentation

## 2021-07-22 LAB — RESP PANEL BY RT-PCR (FLU A&B, COVID) ARPGX2
Influenza A by PCR: NEGATIVE
Influenza B by PCR: NEGATIVE
SARS Coronavirus 2 by RT PCR: NEGATIVE

## 2021-07-22 MED ORDER — PREDNISONE 10 MG PO TABS
ORAL_TABLET | ORAL | 0 refills | Status: DC
Start: 2021-07-22 — End: 2022-03-21

## 2021-07-22 NOTE — ED Triage Notes (Signed)
Pt has had earache/fullness and dizziness x 3 weeks. Pt has been seen 2x by pcp and told she had fluid behind her ear. Pt has been given amoxicillin and augmentin. Pt states today shes been "hot and cold", dizziness, and ear fullness.

## 2021-07-22 NOTE — Discharge Instructions (Signed)
Take the entire course of the prednisone as discussed.  Take each dose around the same time each day.  Plan to see your doctor next week for recheck as discussed.  You have been screened for COVID-19, although I do not think today's symptoms are from this infection.  This test should result within several hours and should be available on your MyChart.  You will also be notified if it is positive.  If it is negative it would be safe for you to return to work on Wednesday.

## 2021-07-24 NOTE — ED Provider Notes (Signed)
[facility] EMERGENCY DEPARTMENT Provider Note   CSN: [id number] Arrival date & time: 07/22/21  1037     History Chief Complaint  Patient presents with   Ear Fullness    [name] is a 45 y.o. female presenting with complaint of persistent right ear fullness, pressure sensation along with reduced hearing acuity and episodes of lightheadedness for the past 3 weeks.  Sx initially also involved the left ear which has improved.  She was seen by her pcp twice for this problem, first told was a virus, and was told she had fluid behind the ear drum at her second visit, was prescribed augmentin, now on day 5 without sig improvement.  She denies headache, denies documented fever but has had hot and cold spells. She denies sinus or nasal congestion or drainage.  She had similar sx last ear and was seen by Dr. Benjamine Mola of ENT, does not recall the diagnosis but was placed on prednisone which resolved her sx.  She has tried otc allergy tablets without relief.  Since being she had a viral infection, has concerns about Covid and would like screening.  The history is provided by the patient.      Past Medical History:  Diagnosis Date   Anemia    GERD (gastroesophageal reflux disease)    Helicobacter pylori (H. pylori)    prevpac   Hemorrhoids     Patient Active Problem List   Diagnosis Date Noted   Dizziness 05/03/2020   Monilial vaginitis 09/20/2017   S/P abdominal supracervical subtotal hysterectomy 06/03/2017   GERD 03/07/2010   EPIGASTRIC PAIN 0000000   HELICOBACTER PYLORI INFECTION, HX OF 03/07/2010    Past Surgical History:  Procedure Laterality Date   ABDOMINAL HYSTERECTOMY     APPENDECTOMY     45 years old   ESOPHAGOGASTRODUODENOSCOPY  09/14/2007   Dr. Hurley Cisco quadrant distal esophageal erosion consistant with  moderately severe erosive reflux esophagitits, o/w normal esophagus.,tiny antral erosions o/w normal stomach, inflammation on bx   ESOPHAGOGASTRODUODENOSCOPY  (EGD) WITH ESOPHAGEAL DILATION  12/09/2012   Dr. Gala Romney- normal esophagus s/p maloney dilation, small hiatal hernia   SALPINGOOPHORECTOMY Right 06/03/2017   Procedure: SALPINGO OOPHORECTOMY;  Surgeon: Florian Buff, MD;  Location: [facility];  Service: Gynecology;  Laterality: Right;   SUPRACERVICAL ABDOMINAL HYSTERECTOMY N/A 06/03/2017   Procedure: HYSTERECTOMY SUPRACERVICAL ABDOMINAL;  Surgeon: Florian Buff, MD;  Location: [facility];  Service: Gynecology;  Laterality: N/A;   UNILATERAL SALPINGECTOMY Left 06/03/2017   Procedure: left salpingectomy;  Surgeon: Florian Buff, MD;  Location: [facility];  Service: Gynecology;  Laterality: Left;     OB History     Gravida      Para      Term      Preterm      AB      Living  0      SAB      IAB      Ectopic      Multiple      Live Births              History reviewed. No pertinent family history.  Social History   Tobacco Use   Smoking status: Never   Smokeless tobacco: Never  Vaping Use   Vaping Use: Never used  Substance Use Topics   Alcohol use: No   Drug use: No    Home Medications Prior to Admission medications   Medication Sig Start Date End Date Taking? Authorizing Provider  predniSONE (DELTASONE) 10 MG tablet 5, 4, 3, 2 then 1 tablet by mouth daily for 5 days total. 07/22/21  Yes Clanton Emanuelson, Almyra Free, PA-C  meclizine (ANTIVERT) 25 MG tablet Take 1 tablet (25 mg total) by mouth 3 (three) times daily as needed for dizziness. 05/03/20   Shahmehdi, Valeria Batman, MD  omeprazole (PRILOSEC) 20 MG capsule Take 20 mg as needed by mouth.  Patient not taking: Reported on 05/03/2020    [provider]    Allergies    Tamiflu [oseltamivir phosphate], Levofloxacin, and Sulfonamide derivatives  Review of Systems   Review of Systems  Constitutional:  Positive for chills. Negative for fever.  HENT:  Positive for congestion, ear pain and hearing loss. Negative for ear discharge, postnasal drip, rhinorrhea, sinus pressure, sinus  pain, sore throat, tinnitus, trouble swallowing and voice change.   Eyes:  Negative for discharge and visual disturbance.  Respiratory:  Negative for cough, shortness of breath, wheezing and stridor.   Cardiovascular:  Negative for chest pain.  Gastrointestinal:  Negative for abdominal pain.  Genitourinary: Negative.   Neurological:  Positive for dizziness. Negative for headaches.   Physical Exam Updated Vital Signs BP (!) 131/93 (BP Location: Right Arm)   Pulse 71   Temp 99.6 F (37.6 C) (Oral)   Resp 17   Ht '5\' 2"'$  (1.575 m)   Wt 61.2 kg   LMP 06/03/2017   SpO2 100%   BMI 24.69 kg/m   Physical Exam Vitals reviewed.  Constitutional:      Appearance: She is well-developed.  HENT:     Head: Normocephalic and atraumatic.     Right Ear: Ear canal normal. A middle ear effusion is present. Tympanic membrane is bulging. Tympanic membrane is not retracted.     Left Ear: Tympanic membrane and ear canal normal.     Ears:     Comments: Mild loss of landmarks right TM,  serous effusion right middle ear.    Nose: Mucosal edema and rhinorrhea present.     Mouth/Throat:     Mouth: Mucous membranes are moist.     Pharynx: Oropharynx is clear. Uvula midline. No oropharyngeal exudate or posterior oropharyngeal erythema.     Tonsils: No tonsillar abscesses.  Eyes:     Extraocular Movements:     Right eye: Normal extraocular motion and no nystagmus.     Left eye: Normal extraocular motion and no nystagmus.     Conjunctiva/sclera: Conjunctivae normal.  Cardiovascular:     Rate and Rhythm: Normal rate.     Heart sounds: Normal heart sounds.  Pulmonary:     Effort: Pulmonary effort is normal. No respiratory distress.     Breath sounds: No wheezing or rales.  Musculoskeletal:        General: Normal range of motion.  Skin:    General: Skin is warm and dry.     Findings: No rash.  Neurological:     General: No focal deficit present.     Mental Status: She is alert and oriented to person,  place, and time.     Sensory: No sensory deficit.     Coordination: Coordination normal.     Gait: Gait normal.  Psychiatric:        Mood and Affect: Mood normal.    ED Results / Procedures / Treatments   Labs (all labs ordered are listed, but only abnormal results are displayed) Labs Reviewed  RESP PANEL BY RT-PCR (FLU A&B, COVID) [id number]    EKG None  Radiology No results found.  Procedures Procedures   Medications Ordered in ED Medications - No data to display  ED Course  I have reviewed the triage vital signs and the nursing notes.  Pertinent labs & imaging results that were available during my care of the patient were reviewed by me and considered in my medical decision making (see chart for details).    MDM Rules/Calculators/A&P                           Exam and hx suggesting eustachian tube dysfunction, no signs of bacterial otitis, also no vertigo sx.  She has responded to similar sx with prednisone in the past, also discussed inhaled steroid for potential less SE, pt prefers the prednisone since this has worked in the past. Covid screen pending but doubt this is source of sx. Advised to finish her abx (2 more days), plan f/u with pcp for recheck if not improved with tx, may concern further eval also with Dr. Benjamine Mola if sx persist.  The patient appears reasonably screened and/or stabilized for discharge and I doubt any other medical condition or other [facility] requiring further screening, evaluation, or treatment in the ED at this time prior to discharge.  Final Clinical Impression(s) / ED Diagnoses Final diagnoses:  Eustachian tube disorder, right    Rx / DC Orders ED Discharge Orders          Ordered    predniSONE (DELTASONE) 10 MG tablet        07/22/21 1804             Evalee Jefferson, PA-C 07/24/21 1417    Varney Biles, MD 07/26/21 1420

## 2021-12-23 ENCOUNTER — Other Ambulatory Visit: Payer: Self-pay

## 2021-12-23 ENCOUNTER — Encounter: Payer: Self-pay | Admitting: Allergy & Immunology

## 2021-12-23 ENCOUNTER — Ambulatory Visit: Payer: BC Managed Care – PPO | Admitting: Allergy & Immunology

## 2021-12-23 VITALS — BP 126/82 | Temp 97.7°F | Resp 18 | Ht 62.0 in | Wt 178.4 lb

## 2021-12-23 DIAGNOSIS — H8393 Unspecified disease of inner ear, bilateral: Secondary | ICD-10-CM

## 2021-12-23 DIAGNOSIS — R42 Dizziness and giddiness: Secondary | ICD-10-CM | POA: Diagnosis not present

## 2021-12-23 NOTE — Progress Notes (Signed)
NEW PATIENT  Date of Service/Encounter:  12/23/21  Consult requested by: Melissa Evens, MD   Assessment:   Dizziness   Light headedness - with negative testing to the entire environmental allergy panel (intradermals NOT done)  Dysfunction of both inner ears  Plan/Recommendations:   1. Dizziness with lightheaded nests and inner ear dysfunction - Testing was only positive to one grass. - I do not think that this explains your symptoms at all since grass is only blooming during a part of the year. - I would stop the Zyrtec since this did not seem to help at all. - I would go back and see Dr. Benjamine Hardin for a more in depth balancing rehabilitation.   2.  Concern for contact dermatitis - Patient asked about patch testing on her way out of the office today. - We explained the process and she is going to check with her insurance coverage on patch testing.  - We never really discussed any kind of rash during the visit, as she only told me that she was here for her ear issues, including light headedness. - We can ask more about this at the next visit, if she decides to pursue patch testing.   3. Follow up as needed.     This note in its entirety was forwarded to the Provider who requested this consultation.  Subjective:   [name] is a 46 y.o. female presenting today for evaluation of No chief complaint on file.   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  [name] was referred by Melissa Evens, MD.     [name] is a 46 y.o. female presenting for an evaluation of dizziness . She had no allergy symptoms as a child.  She does not  have symptoms as of right now. Last summer 2022, she developed light headedness. She was not dizzy but she was just light headed. At the hospital, they did a workup for a stroke. She was diagnosed with an inner ear infection. Symptoms improved with prednisone.   She went to see Dr. Benjamine Hardin on [facility] in [place]. He felt that this was na episode that occurred once and it might not happen again.   The light headedness did not return, but she did develop ear fullness when she was in the shower. She went to the PCP and no medications were given. Symptoms did not improve and she ended up going into the ED and was diagnosed with an inner ear infection. She got prednisone a couple of times after that with improvement in her symptoms.  She does use Vicks nose spray routinely (confirmed that this is NOT the one that contains oxymetazoline). She has been using this a few times per day for months, which seems to be the only allergy medication that she has been using on the regular.    She did see [facility] on [address] for evaluation of her balance. She seemed to be falling to the right. This is why she saw Physical Therapy. She had periphery vision screening performed which was normal.    Therefore she was told that she was likely experiencing allergies. There are some family members  with seasonal allergies, otherwise no atopic history.  She has a history of reacting to levofloxacin.   Otherwise, there is no history of other atopic diseases, including drug allergies, stinging insect allergies, eczema, urticaria, or contact dermatitis. There is no significant infectious history. Vaccinations are up to date.    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   Sulfonamide Derivatives Hives        Medication List        Accurate as of December 23, 2021  3:56 PM. If you have any questions, ask your nurse or doctor.          meclizine 25 MG tablet Commonly known as: ANTIVERT Take 1 tablet (25 mg total) by mouth 3 (three) times daily as needed for dizziness.   omeprazole 20 MG capsule Commonly known as: PRILOSEC Take 20 mg as needed by mouth.   predniSONE 10 MG tablet Commonly known as: DELTASONE 5, 4, 3, 2 then 1 tablet by mouth daily for 5 days total.        Birth History: non-contributory  Developmental History: non-contributory  Past Surgical History: Past Surgical History:  Procedure Laterality Date   ABDOMINAL HYSTERECTOMY     APPENDECTOMY     46 years old   ESOPHAGOGASTRODUODENOSCOPY  09/14/2007   Dr. Hurley Hardin quadrant distal esophageal erosion consistant with  moderately severe erosive reflux esophagitits, o/w normal esophagus.,tiny antral erosions o/w normal stomach, inflammation on bx   ESOPHAGOGASTRODUODENOSCOPY (EGD) WITH ESOPHAGEAL DILATION  12/09/2012   Dr. Gala Hardin- normal esophagus s/p maloney dilation, small hiatal hernia   SALPINGOOPHORECTOMY Right 06/03/2017   Procedure: SALPINGO OOPHORECTOMY;  Surgeon: Melissa Buff, MD;  Location: [facility];  Service: Gynecology;  Laterality: Right;   SUPRACERVICAL ABDOMINAL HYSTERECTOMY N/A 06/03/2017   Procedure: HYSTERECTOMY SUPRACERVICAL ABDOMINAL;  Surgeon: Melissa Buff, MD;  Location: [facility];  Service: Gynecology;  Laterality: N/A;   UNILATERAL SALPINGECTOMY Left 06/03/2017   Procedure: left salpingectomy;  Surgeon: Melissa Buff, MD;  Location: [facility];  Service: Gynecology;  Laterality: Left;     Family History: History reviewed. No  pertinent family history.   Social History: [name] lives at home with her family.  She lives in a house that is 46 years old.  There is carpeting throughout the home.  They have gas heating and central cooling.  There are no animals inside or outside of the home.  She does have a mattress cover, but she does not sleep on pillows.  There is no tobacco exposure.  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 She likes the college students better. She is not exposed to fumes, chemicals, or dust.  She does not use a HEPA filter.  She does not live near an interstate or industrial area.   Review of Systems  Constitutional: Negative.  Negative for chills, fever, malaise/fatigue and weight loss.  HENT: Negative.  Negative for congestion, ear discharge, ear pain and sinus pain.  Eyes:  Negative for pain, discharge and redness.  Respiratory:  Negative for cough, sputum production, shortness of breath and wheezing.   Cardiovascular: Negative.  Negative for chest pain and palpitations.  Gastrointestinal:  Negative for abdominal pain, constipation, diarrhea, heartburn, nausea and vomiting.  Skin: Negative.  Negative for itching and rash.  Neurological:  Positive for dizziness. Negative for headaches.       Positive for light-headedness.  Endo/Heme/Allergies:  Negative for environmental allergies. Does not bruise/bleed easily.      Objective:   Last menstrual period 06/03/2017. There is no height or weight on file to calculate BMI.   Physical Exam Vitals reviewed.  Constitutional:      Appearance: She is well-developed.  HENT:     Head: Normocephalic and atraumatic.     Right Ear: Tympanic membrane, ear canal and external ear normal. No drainage, swelling or tenderness. Tympanic membrane is not injected, scarred, erythematous, retracted or bulging.     Left Ear: Tympanic membrane, ear canal and external ear normal. No drainage, swelling or  tenderness. Tympanic membrane is not injected, scarred, erythematous, retracted or bulging.     Ears:     Comments: Ears look completely normal bilaterally.     Nose: No nasal deformity, septal deviation, mucosal edema or rhinorrhea.     Right Turbinates: Enlarged and swollen.     Right Sinus: No maxillary sinus tenderness or frontal sinus tenderness.     Left Sinus: No maxillary sinus tenderness or frontal sinus tenderness.     Mouth/Throat:     Mouth: Mucous membranes are not pale and not dry.     Pharynx: Uvula midline.  Eyes:     General: Lids are normal.        Right eye: No discharge.        Left eye: No discharge.     Conjunctiva/sclera: Conjunctivae normal.     Right eye: Right conjunctiva is not injected. No chemosis.    Left eye: Left conjunctiva is not injected. No chemosis.    Pupils: Pupils are equal, round, and reactive to light.  Cardiovascular:     Rate and Rhythm: Normal rate and regular rhythm.     Heart sounds: Normal heart sounds.  Pulmonary:     Effort: Pulmonary effort is normal. No tachypnea, accessory muscle usage or respiratory distress.     Breath sounds: Normal breath sounds. No wheezing, rhonchi or rales.     Comments: No increased work of breathing noted.  Chest:     Chest wall: No tenderness.  Abdominal:     Tenderness: There is no abdominal tenderness. There is no guarding or rebound.  Lymphadenopathy:     Head:     Right side of head: No submandibular, tonsillar or occipital adenopathy.     Left side of head: No submandibular, tonsillar or occipital adenopathy.     Cervical: No cervical adenopathy.  Skin:    Coloration: Skin is not pale.     Findings: No abrasion, erythema, petechiae or rash. Rash is not papular, urticarial or vesicular.  Neurological:     Mental Status: She is alert.  Psychiatric:        Behavior: Behavior is cooperative.     Diagnostic studies:   Allergy Studies:     Airborne Adult Perc - 12/23/21 1414     Time  Antigen Placed 1415    Allergen Manufacturer Lavella Hammock    Location Back    Number of Test 59    1.  Control-Buffer 50% Glycerol Negative    2. Control-Histamine 1 mg/ml 2+    3. Albumin saline Negative    4. Early Negative    5. [place] 2+    6. Johnson Negative    7. [place] Blue Negative    8. Meadow Fescue Negative    9. Perennial Rye Negative    10. Sweet Vernal Negative    11. Timothy Negative    12. Cocklebur Negative    13. Burweed Marshelder Negative    14. Ragweed, short Negative    15. Ragweed, Giant Negative    16. Plantain,  English Negative    17. Lamb's Quarters Negative    18. Sheep Sorrell Negative    19. Rough Pigweed Negative    20. Marsh Elder, Rough Negative    21. Mugwort, Common Negative    22. Ash mix Negative    23. Birch mix Negative    24. Beech American Negative    25. Box, Elder Negative    26. Cedar, red Negative    27. Cottonwood, [place] Negative    28. Elm mix Negative    29. Hickory Negative    30. Maple mix Negative    31. Oak, [place] mix Negative    32. Pecan Pollen Negative    33. Pine mix Negative    34. Sycamore Eastern Negative    35. [place], Black Pollen Negative    36. Alternaria alternata Negative    37. Cladosporium Herbarum Negative    38. Aspergillus mix Negative    39. Penicillium mix Negative    40. Bipolaris sorokiniana (Helminthosporium) Negative    41. Drechslera spicifera (Curvularia) Negative    42. Mucor plumbeus Negative    43. Fusarium moniliforme Negative    44. Aureobasidium pullulans (pullulara) Negative    45. Rhizopus oryzae Negative    46. Botrytis cinera Negative    47. Epicoccum nigrum Negative    48. Phoma betae Negative    49. Candida Albicans Negative    50. Trichophyton mentagrophytes Negative    51. Mite, D Farinae  5,000 AU/ml Negative    52. Mite, D Pteronyssinus  5,000 AU/ml Negative    53. Cat Hair 10,000 BAU/ml Negative    54.  Dog Epithelia Negative    55. Mixed Feathers Negative    56.  Horse Epithelia Negative    57. Cockroach, German Negative    58. Mouse Negative    59. Tobacco Leaf Negative             Allergy testing results were read and interpreted by myself, documented by clinical staff.         Salvatore Marvel, MD [facility]

## 2021-12-23 NOTE — Patient Instructions (Signed)
1. Dizziness - Testing was only positive to one grass. - I do not think that this explains your symptoms at all since grass is only blooming during a part of the year. - I would stop the Zyrtec since this did not seem to help at all. - I would go back and see Dr. Benjamine Mola for a more in depth balancing rehabilitation.   2. Follow up as needed.    Please inform us of any Emergency Department visits, hospitalizations, or changes in symptoms. Call us before going to the ED for breathing or allergy symptoms since we might be able to fit you in for a sick visit. Feel free to contact us anytime with any questions, problems, or concerns.  It was a pleasure to meet you today!  Websites that have reliable patient information: 1. American Academy of Asthma, Allergy, and Immunology: www.aaaai.org 2. Food Allergy Research and Education (FARE): foodallergy.org 3. Mothers of Asthmatics: http://www.asthmacommunitynetwork.org 4. American College of Allergy, Asthma, and Immunology: www.acaai.org   COVID-19 Vaccine Information can be found at: ShippingScam.[email or web address].uk For questions related to vaccine distribution or appointments, please email [email or web address] or call [phone].   We realize that you might be concerned about having an allergic reaction to the COVID19 vaccines. To help with that concern, WE ARE OFFERING THE COVID19 VACCINES IN OUR OFFICE! Ask the front desk for dates!     Like [place] on [employer] and Instagram for our latest updates!      A healthy democracy works best when [employer] participate! Make sure you are registered to vote! If you have moved or changed any of your contact information, you will need to get this updated before voting!  In some cases, you MAY be able to register to vote online: [email or web address]     Airborne Adult Perc - 12/23/21 1414     Time Antigen Placed Kane Lavella Hammock    Location Back    Number of Test 59    1. Control-Buffer 50% Glycerol Negative    2. Control-Histamine 1 mg/ml 2+    3. Albumin saline Negative    4. [place] Negative    5. [place] 2+    6. Johnson Negative    7. [place] Blue Negative    8. Meadow Fescue Negative    9. Perennial Rye Negative    10. Sweet Vernal Negative    11. Timothy Negative    12. Cocklebur Negative    13. Burweed Marshelder Negative    14. Ragweed, short Negative    15. Ragweed, Giant Negative    16. Plantain,  English Negative    17. Lamb's Quarters Negative    18. Sheep Sorrell Negative    19. Rough Pigweed Negative    20. Marsh Elder, Rough Negative    21. Mugwort, Common Negative    22. Ash mix Negative    23. Birch mix Negative    24. Beech American Negative    25. Box, Elder Negative    26. Cedar, red Negative    27. Cottonwood, [place] Negative    28. Elm mix Negative    29. Hickory Negative    30. Maple mix Negative    31. Oak, [place] mix Negative    32. Pecan Pollen Negative    33. Pine mix Negative    34. Sycamore Eastern Negative    35. [place], Black Pollen Negative    36. Alternaria alternata Negative    37. Cladosporium Herbarum Negative  38. Aspergillus mix Negative    39. Penicillium mix Negative    40. Bipolaris sorokiniana (Helminthosporium) Negative    41. Drechslera spicifera (Curvularia) Negative    42. Mucor plumbeus Negative    43. Fusarium moniliforme Negative    44. Aureobasidium pullulans (pullulara) Negative    45. Rhizopus oryzae Negative    46. Botrytis cinera Negative    47. Epicoccum nigrum Negative    48. Phoma betae Negative    49. Candida Albicans Negative    50. Trichophyton mentagrophytes Negative    51. Mite, D Farinae  5,000 AU/ml Negative    52. Mite, D Pteronyssinus  5,000 AU/ml Negative    53. Cat Hair 10,000 BAU/ml Negative    54.  Dog Epithelia Negative    55. Mixed Feathers Negative    56. Horse Epithelia Negative     57. Cockroach, German Negative    58. Mouse Negative    59. Tobacco Leaf Negative

## 2022-01-01 ENCOUNTER — Encounter: Payer: Self-pay | Admitting: Allergy & Immunology

## 2022-01-23 ENCOUNTER — Other Ambulatory Visit: Payer: Self-pay

## 2022-01-23 ENCOUNTER — Ambulatory Visit
Admission: EM | Admit: 2022-01-23 | Discharge: 2022-01-23 | Disposition: A | Discharge status: Home / Self Care | Payer: BC Managed Care – PPO | Attending: Family Medicine | Admitting: Family Medicine

## 2022-01-23 DIAGNOSIS — J0100 Acute maxillary sinusitis, unspecified: Secondary | ICD-10-CM | POA: Diagnosis not present

## 2022-01-23 DIAGNOSIS — H1031 Unspecified acute conjunctivitis, right eye: Secondary | ICD-10-CM | POA: Diagnosis not present

## 2022-01-23 MED ORDER — ERYTHROMYCIN 5 MG/GM OP OINT: TOPICAL_OINTMENT | OPHTHALMIC | 0 refills | Status: DC

## 2022-01-23 MED ORDER — LIDOCAINE VISCOUS HCL 2 % MT SOLN: 10.0000 mL | OROMUCOSAL | 0 refills | Status: DC | PRN

## 2022-01-23 MED ORDER — AMOXICILLIN-POT CLAVULANATE 875-125 MG PO TABS: 1.0000 | ORAL_TABLET | Freq: Two times a day (BID) | ORAL | 0 refills | Status: DC

## 2022-01-23 MED ORDER — AZITHROMYCIN 250 MG PO TABS: 250.0000 mg | ORAL_TABLET | Freq: Every day | ORAL | 0 refills | Status: DC

## 2022-01-23 MED ORDER — FLUCONAZOLE 150 MG PO TABS: 150.0000 mg | ORAL_TABLET | ORAL | 0 refills | Status: DC

## 2022-01-23 NOTE — ED Triage Notes (Signed)
Pt states last Wednesday morning she oke up with a bad sore throat and later that day she lost her voice for the rest of the week ? ?Pt states she went to her PCP and they told her that she had viral infection and did not give her any meds ? ?Pt states she woke up yesterday with her right eye red and mucus coming from it ? ?Pt states she is still experiencing a sore throat ? ?Pt states she has tried Mucinex and throat lozenges  ? ?Pt states she did take an at home Covid test and it was negative ?

## 2022-01-23 NOTE — ED Provider Notes (Signed)
?[facility] ? ? ? ?CSN: [id number] ?Arrival date & time: 01/23/22  1040 ? ? ?  ? ?History   ?Chief Complaint ?Chief Complaint  ?Patient presents with  ? Eye Problem  ? ? ?HPI ?[name] is a 46 y.o. female.  ? ?Presenting today with over a week of progressively worsening sore throat, congestion, now woke up yesterday with right eye redness, thick drainage.  Denies significant cough, chest pain, shortness of breath, abdominal pain, nausea vomiting or diarrhea.  Was seen earlier this week by PCP and was told it was a viral infection, supportive care was reviewed.  She is been trying over-the-counter medications and symptoms continue to worsen. ? ? ?Past Medical History:  ?Diagnosis Date  ? Anemia   ? GERD (gastroesophageal reflux disease)   ? Helicobacter pylori (H. pylori)   ? prevpac  ? Hemorrhoids   ? ? ?Patient Active Problem List  ? Diagnosis Date Noted  ? Dizziness 05/03/2020  ? Monilial vaginitis 09/20/2017  ? 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 GERD 03/07/2010  ? EPIGASTRIC PAIN 03/07/2010  ? HELICOBACTER PYLORI INFECTION, HX OF 03/07/2010  ? ? ?Past Surgical History:  ?Procedure Laterality Date  ? ABDOMINAL HYSTERECTOMY    ? APPENDECTOMY    ? 46 years old  ? ESOPHAGOGASTRODUODENOSCOPY  09/14/2007  ? Dr. Hurley Cisco quadrant distal esophageal erosion consistant with  moderately severe erosive reflux esophagitits, o/w normal esophagus.,tiny antral erosions o/w normal stomach, inflammation on bx  ? ESOPHAGOGASTRODUODENOSCOPY (EGD) WITH ESOPHAGEAL DILATION  12/09/2012  ? Dr. Gala Romney- normal esophagus s/p maloney dilation, small hiatal hernia  ? SALPINGOOPHORECTOMY Right 06/03/2017  ? Procedure: SALPINGO OOPHORECTOMY;  Surgeon: Florian Buff, MD;  Location: [facility];  Service: Gynecology;  Laterality: Right;  ? SUPRACERVICAL ABDOMINAL HYSTERECTOMY N/A 06/03/2017  ? Procedure: HYSTERECTOMY SUPRACERVICAL ABDOMINAL;  Surgeon: Florian Buff, MD;  Location: [facility];  Service:  Gynecology;  Laterality: N/A;  ? UNILATERAL SALPINGECTOMY Left 06/03/2017  ? Procedure: left salpingectomy;  Surgeon: Florian Buff, MD;  Location: [facility];  Service: Gynecology;  Laterality: Left;  ? ? ?OB History   ? ? Gravida  ?   ? Para  ?   ? Term  ?   ? Preterm  ?   ? AB  ?   ? Living  ?0  ?  ? ? SAB  ?   ? IAB  ?   ? Ectopic  ?   ? Multiple  ?   ? Live Births  ?   ?   ?  ?  ? ? ? ?Home Medications   ? ?Prior to Admission medications   ?Medication Sig Start Date End Date Taking? Authorizing Provider  ?amoxicillin-clavulanate (AUGMENTIN) 875-125 MG tablet Take 1 tablet by mouth every 12 (twelve) hours. 01/23/22  Yes Volney American, PA-C  ?azithromycin (ZITHROMAX) 250 MG tablet Take 1 tablet (250 mg total) by mouth daily. Take first 2 tablets together, then 1 every day until finished. 01/23/22  Yes Volney American, PA-C  ?erythromycin ophthalmic ointment Place a 1/2 inch ribbon of ointment into the right lower eyelid BID prn. 01/23/22  Yes Volney American, PA-C  ?fluconazole (DIFLUCAN) 150 MG tablet Take 1 tablet (150 mg total) by mouth once a week. 01/23/22  Yes Volney American, PA-C  ?lidocaine (XYLOCAINE) 2 % solution Use as directed 10 mLs in the mouth or throat every 3 (three) hours as needed for mouth pain. 01/23/22  Yes Merrie Roof  Benjamine Mola, PA-C  ?meclizine (ANTIVERT) 25 MG tablet Take 1 tablet (25 mg total) by mouth 3 (three) times daily as needed for dizziness. ?Patient not taking: Reported on 12/23/2021 05/03/20   Deatra James, MD  ?omeprazole (PRILOSEC) 20 MG capsule Take 20 mg as needed by mouth.  ?Patient not taking: Reported on 05/03/2020    [provider]  ?predniSONE (DELTASONE) 10 MG tablet 5, 4, 3, 2 then 1 tablet by mouth daily for 5 days total. 07/22/21   Evalee Jefferson, PA-C  ? ? ?Family History ?No family history on file. ? ?Social History ?Social History  ? ?Tobacco Use  ? Smoking status: Never  ? Smokeless tobacco: Never  ?Vaping Use  ? Vaping Use: Never  used  ?Substance Use Topics  ? Alcohol use: No  ? Drug use: No  ? ? ? ?Allergies   ?Tamiflu [oseltamivir phosphate], Levofloxacin, and Sulfonamide derivatives ? ? ?Review of Systems ?Review of Systems ?Per HPI ? ?Physical Exam ?Triage Vital Signs ?ED Triage Vitals  ?Enc Vitals Group  ?   BP 01/23/22 1303 (!) 145/85  ?   Pulse Rate 01/23/22 1303 86  ?   Resp 01/23/22 1303 18  ?   Temp 01/23/22 1303 98.4 ?F (36.9 ?C)  ?   Temp Source 01/23/22 1303 Oral  ?   SpO2 01/23/22 1303 98 %  ?   Weight --   ?   Height --   ?   Head Circumference --   ?   Peak Flow --   ?   Pain Score 01/23/22 1300 6  ?   Pain Loc --   ?   Pain Edu? --   ?   Excl. in [place]? --   ? ?No data found. ? ?Updated Vital Signs ?BP (!) 145/85 (BP Location: Right Arm)   Pulse 86   Temp 98.4 ?F (36.9 ?C) (Oral)   Resp 18   LMP 06/03/2017   SpO2 98%  ? ?Visual Acuity ?Right Eye Distance:   ?Left Eye Distance:   ?Bilateral Distance:   ? ?Right Eye Near:   ?Left Eye Near:    ?Bilateral Near:    ? ?Physical Exam ?Vitals and nursing note reviewed.  ?Constitutional:   ?   Appearance: Normal appearance. She is not ill-appearing.  ?HENT:  ?   Head: Atraumatic.  ?   Right Ear: Tympanic membrane normal.  ?   Left Ear: Tympanic membrane normal.  ?   Nose: Rhinorrhea present.  ?   Mouth/Throat:  ?   Mouth: Mucous membranes are moist.  ?   Pharynx: Posterior oropharyngeal erythema present. No oropharyngeal exudate.  ?Eyes:  ?   Extraocular Movements: Extraocular movements intact.  ?   Pupils: Pupils are equal, round, and reactive to light.  ?   Comments: Right conjunctival erythema, injection, drainage  ?Cardiovascular:  ?   Rate and Rhythm: Normal rate and regular rhythm.  ?   Heart sounds: Normal heart sounds.  ?Pulmonary:  ?   Effort: Pulmonary effort is normal.  ?   Breath sounds: Normal breath sounds. No wheezing or rales.  ?Musculoskeletal:     ?   General: Normal range of motion.  ?   Cervical back: Normal range of motion and neck supple.  ?Skin: ?   General:  Skin is warm and dry.  ?Neurological:  ?   Mental Status: She is alert and oriented to person, place, and time.  ?   Motor: No weakness.  ?  Gait: Gait normal.  ?Psychiatric:     ?   Mood and Affect: Mood normal.     ?   Thought Content: Thought content normal.     ?   Judgment: Judgment normal.  ? ?UC Treatments / Results  ?Labs ?(all labs ordered are listed, but only abnormal results are displayed) ?Labs Reviewed - No data to display ? ?EKG ? ? ?Radiology ?No results found. ? ?Procedures ?Procedures (including critical care time) ? ?Medications Ordered in UC ?Medications - No data to display ? ?Initial Impression / Assessment and Plan / UC Course  ?I have reviewed the triage vital signs and the nursing notes. ? ?Pertinent labs & imaging results that were available during my care of the patient were reviewed by me and considered in my medical decision making (see chart for details). ? ?  ? ?Treat with Augmentin, viscous lidocaine, erythromycin ointment.  Discussed ported care and return precautions.  Work note given. ? ?Final Clinical Impressions(s) / UC Diagnoses  ? ?Final diagnoses:  ?Acute maxillary sinusitis, recurrence not specified  ?Acute conjunctivitis of right eye, unspecified acute conjunctivitis type  ? ?Discharge Instructions   ?None ?  ? ?ED Prescriptions   ? ? Medication Sig Dispense Auth. Provider  ? amoxicillin-clavulanate (AUGMENTIN) 875-125 MG tablet Take 1 tablet by mouth every 12 (twelve) hours. 14 tablet Volney American, [place]  ? lidocaine (XYLOCAINE) 2 % solution Use as directed 10 mLs in the mouth or throat every 3 (three) hours as needed for mouth pain. 100 mL Volney American, [place]  ? erythromycin ophthalmic ointment Place a 1/2 inch ribbon of ointment into the right lower eyelid BID prn. 3.5 g Volney American, PA-C  ? azithromycin (ZITHROMAX) 250 MG tablet Take 1 tablet (250 mg total) by mouth daily. Take first 2 tablets together, then 1 every day until finished. 6  tablet Volney American, [place]  ? fluconazole (DIFLUCAN) 150 MG tablet Take 1 tablet (150 mg total) by mouth once a week. 2 tablet Volney American, [place]  ? ?  ? ?PDMP not reviewed this encounter. ?  ?

## 2022-01-24 ENCOUNTER — Emergency Department (HOSPITAL_COMMUNITY)
Admission: EM | Admit: 2022-01-24 | Discharge: 2022-01-24 | Disposition: A | Discharge status: Home / Self Care | Payer: BC Managed Care – PPO | Attending: Emergency Medicine | Admitting: Emergency Medicine

## 2022-01-24 ENCOUNTER — Encounter (HOSPITAL_COMMUNITY): Payer: Self-pay | Admitting: Emergency Medicine

## 2022-01-24 DIAGNOSIS — M542 Cervicalgia: Secondary | ICD-10-CM | POA: Diagnosis not present

## 2022-01-24 DIAGNOSIS — H9202 Otalgia, left ear: Secondary | ICD-10-CM | POA: Diagnosis present

## 2022-01-24 DIAGNOSIS — H6992 Unspecified Eustachian tube disorder, left ear: Secondary | ICD-10-CM | POA: Insufficient documentation

## 2022-01-24 DIAGNOSIS — R070 Pain in throat: Secondary | ICD-10-CM | POA: Diagnosis not present

## 2022-01-24 MED ORDER — DEXAMETHASONE SODIUM PHOSPHATE 10 MG/ML IJ SOLN
10.0000 mg | Freq: Once | INTRAMUSCULAR | Status: AC
  Administered 2022-01-24: 10 mg via INTRAMUSCULAR
  Filled 2022-01-24: qty 1

## 2022-01-24 MED ORDER — CLARITIN-D 24 HOUR 10-240 MG PO TB24: 1.0000 | ORAL_TABLET | Freq: Every day | ORAL | 0 refills | Status: DC

## 2022-01-24 MED ORDER — HYDROCODONE-ACETAMINOPHEN 5-325 MG PO TABS: 1.0000 | ORAL_TABLET | ORAL | 0 refills | Status: DC | PRN

## 2022-01-24 MED ORDER — KETOROLAC TROMETHAMINE 30 MG/ML IJ SOLN
30.0000 mg | Freq: Once | INTRAMUSCULAR | Status: AC
  Administered 2022-01-24: 30 mg via INTRAMUSCULAR
  Filled 2022-01-24: qty 1

## 2022-01-24 NOTE — ED Provider Notes (Signed)
?[place] ?Provider Note ? ? ?CSN: [id number] ?Arrival date & time: 01/24/22  0126 ? ?  ? ?History ? ?Chief Complaint  ?Patient presents with  ? Sore Throat  ? ? ?[name] is a 46 y.o. female. ? ?Patient presents to the emergency department for evaluation of left ear and facial pain.  Patient reports that she has been sick for several days.  She was seen by her primary care doctor and urgent care.  Patient was diagnosed with conjunctivitis in her right eye, given antibiotic ointment.  She also was diagnosed with pharyngitis and given viscous lidocaine and started on a Z-Pak.  Patient reports that her throat pain has improved but now she is having pain on the left side of her neck and ear when she swallows. ? ? ?  ? ?Home Medications ?Prior to Admission medications   ?Medication Sig Start Date End Date Taking? Authorizing Provider  ?HYDROcodone-acetaminophen (NORCO/VICODIN) 5-325 MG tablet Take 1 tablet by mouth every 4 (four) hours as needed. 01/24/22  Yes Shelia Magallon, Gwenyth Allegra, MD  ?loratadine-pseudoephedrine (CLARITIN-D 24 HOUR) 10-240 MG 24 hr tablet Take 1 tablet by mouth daily. 01/24/22  Yes Calem Cocozza, Gwenyth Allegra, MD  ?amoxicillin-clavulanate (AUGMENTIN) 875-125 MG tablet Take 1 tablet by mouth every 12 (twelve) hours. 01/23/22   Volney American, PA-C  ?azithromycin (ZITHROMAX) 250 MG tablet Take 1 tablet (250 mg total) by mouth daily. Take first 2 tablets together, then 1 every day until finished. 01/23/22   Volney American, PA-C  ?erythromycin ophthalmic ointment Place a 1/2 inch ribbon of ointment into the right lower eyelid BID prn. 01/23/22   Volney American, PA-C  ?fluconazole (DIFLUCAN) 150 MG tablet Take 1 tablet (150 mg total) by mouth once a week. 01/23/22   Volney American, PA-C  ?lidocaine (XYLOCAINE) 2 % solution Use as directed 10 mLs in the mouth or throat every 3 (three) hours as needed for mouth pain. 01/23/22   Volney American, PA-C   ?meclizine (ANTIVERT) 25 MG tablet Take 1 tablet (25 mg total) by mouth 3 (three) times daily as needed for dizziness. ?Patient not taking: Reported on 12/23/2021 05/03/20   Deatra James, MD  ?omeprazole (PRILOSEC) 20 MG capsule Take 20 mg as needed by mouth.  ?Patient not taking: Reported on 05/03/2020    [provider]  ?predniSONE (DELTASONE) 10 MG tablet 5, 4, 3, 2 then 1 tablet by mouth daily for 5 days total. 07/22/21   Evalee Jefferson, PA-C  ?   ? ?Allergies    ?Tamiflu [oseltamivir phosphate], Levofloxacin, and Sulfonamide derivatives   ? ?Review of Systems   ?Review of Systems  ?HENT:  Positive for ear pain and sore throat.   ? ?Physical Exam ?Updated Vital Signs ?BP (!) 129/101 (BP Location: Left Arm)   Pulse 96   Temp 98 ?F (36.7 ?C) (Oral)   Resp 16   Ht '5\' 2"'$  (1.575 m)   Wt 70.8 kg   LMP 06/03/2017   SpO2 100%   BMI 28.53 kg/m?  ?Physical Exam ?Vitals and nursing note reviewed.  ?Constitutional:   ?   General: She is not in acute distress. ?   Appearance: She is well-developed.  ?HENT:  ?   Head: Normocephalic and atraumatic.  ?   Right Ear: Tympanic membrane and ear canal normal.  ?   Left Ear: Tympanic membrane and ear canal normal.  ?   Mouth/Throat:  ?   Mouth: Mucous membranes are  moist.  ?Eyes:  ?   General: Vision grossly intact. Gaze aligned appropriately.  ?   Extraocular Movements: Extraocular movements intact.  ?   Conjunctiva/sclera:  ?   Right eye: Right conjunctiva is injected.  ?Cardiovascular:  ?   Rate and Rhythm: Normal rate and regular rhythm.  ?   Pulses: Normal pulses.  ?   Heart sounds: Normal heart sounds, S1 normal and S2 normal. No murmur heard. ?  No friction rub. No gallop.  ?Pulmonary:  ?   Effort: Pulmonary effort is normal. No respiratory distress.  ?   Breath sounds: Normal breath sounds.  ?Abdominal:  ?   General: Bowel sounds are normal.  ?   Palpations: Abdomen is soft.  ?   Tenderness: There is no abdominal tenderness. There is no guarding or rebound.   ?   Hernia: No hernia is present.  ?Musculoskeletal:     ?   General: No swelling.  ?   Cervical back: Full passive range of motion without pain, normal range of motion and neck supple. No spinous process tenderness or muscular tenderness. Normal range of motion.  ?   Right lower leg: No edema.  ?   Left lower leg: No edema.  ?Lymphadenopathy:  ?   Head:  ?   Right side of head: No preauricular adenopathy.  ?   Left side of head: No preauricular adenopathy.  ?   Cervical: No cervical adenopathy.  ?Skin: ?   General: Skin is warm and dry.  ?   Capillary Refill: Capillary refill takes less than 2 seconds.  ?   Findings: No ecchymosis, erythema, rash or wound.  ?Neurological:  ?   General: No focal deficit present.  ?   Mental Status: She is alert and oriented to person, place, and time.  ?   GCS: GCS eye subscore is 4. GCS verbal subscore is 5. GCS motor subscore is 6.  ?   Cranial Nerves: Cranial nerves 2-12 are intact.  ?   Sensory: Sensation is intact.  ?   Motor: Motor function is intact.  ?   Coordination: Coordination is intact.  ?Psychiatric:     ?   Attention and Perception: Attention normal.     ?   Mood and Affect: Mood normal.     ?   Speech: Speech normal.     ?   Behavior: Behavior normal.  ? ? ?ED Results / Procedures / Treatments   ?Labs ?(all labs ordered are listed, but only abnormal results are displayed) ?Labs Reviewed - No data to display ? ?EKG ?None ? ?Radiology ?No results found. ? ?Procedures ?Procedures  ? ? ?Medications Ordered in ED ?Medications  ?dexamethasone (DECADRON) injection 10 mg (has no administration in time range)  ?ketorolac (TORADOL) 30 MG/ML injection 30 mg (has no administration in time range)  ? ? ?ED Course/ Medical Decision Making/ A&P ?  ?                        ?Medical Decision Making ?Risk ?OTC drugs. ?Prescription drug management. ? ? ?Presents to the emergency department for evaluation of left ear and throat pain. ? ?Differential diagnosis includes strep pharyngitis,  peritonsillar abscess, otitis media, viral URI. ? ?Examination is unremarkable other than persistent injection of the right eye for which she is already being treated.  Oropharyngeal examination normal, no evidence of peritonsillar abscess.  No intraoral lesions.  No submandibular or parotid swelling or tenderness.  No palpable sialolithiasis.  Patient's pain is mostly on the left anterolateral area of her neck when she swallows, consistent with eustachian tube dysfunction. ? ? ? ? ? ? ? ?Final Clinical Impression(s) / ED Diagnoses ?Final diagnoses:  ?Eustachian tube disorder, left  ? ? ?Rx / DC Orders ?ED Discharge Orders   ? ?      Ordered  ?  HYDROcodone-acetaminophen (NORCO/VICODIN) 5-325 MG tablet  Every 4 hours PRN       ? 01/24/22 0201  ?  loratadine-pseudoephedrine (CLARITIN-D 24 HOUR) 10-240 MG 24 hr tablet  Daily       ? 01/24/22 0201  ? ?  ?  ? ?  ? ? ?  ?Orpah Greek, MD ?01/24/22 0203 ? ?

## 2022-01-24 NOTE — ED Triage Notes (Signed)
Pt c/o sore throat since Wednesday morning. Pt seen by PCP and UC with no improvement. PT now c/o pain to tongue, left ear, and left side of her neck.  ?

## 2022-01-27 MED FILL — Hydrocodone-Acetaminophen Tab 5-325 MG: ORAL | Qty: 6 | Status: AC

## 2022-02-20 ENCOUNTER — Encounter: Payer: Self-pay | Admitting: *Deleted

## 2022-03-20 ENCOUNTER — Other Ambulatory Visit: Payer: Self-pay

## 2022-03-20 ENCOUNTER — Encounter (HOSPITAL_COMMUNITY): Payer: Self-pay

## 2022-03-20 DIAGNOSIS — R070 Pain in throat: Secondary | ICD-10-CM | POA: Insufficient documentation

## 2022-03-20 DIAGNOSIS — H9202 Otalgia, left ear: Secondary | ICD-10-CM | POA: Diagnosis not present

## 2022-03-20 DIAGNOSIS — M542 Cervicalgia: Secondary | ICD-10-CM | POA: Insufficient documentation

## 2022-03-20 DIAGNOSIS — R6884 Jaw pain: Secondary | ICD-10-CM | POA: Insufficient documentation

## 2022-03-20 NOTE — ED Triage Notes (Signed)
Sore throat for a couple days that hurts her left ear when she swallows . Recently seen for the same  ?

## 2022-03-21 ENCOUNTER — Emergency Department (HOSPITAL_COMMUNITY)
Admission: EM | Admit: 2022-03-21 | Discharge: 2022-03-21 | Disposition: A | Discharge status: Home / Self Care | Payer: BC Managed Care – PPO | Attending: Emergency Medicine | Admitting: Emergency Medicine

## 2022-03-21 DIAGNOSIS — M542 Cervicalgia: Secondary | ICD-10-CM

## 2022-03-21 LAB — GROUP A STREP BY PCR: Group A Strep by PCR: NOT DETECTED

## 2022-03-21 MED ORDER — FLUCONAZOLE 150 MG PO TABS: 150.0000 mg | ORAL_TABLET | Freq: Every day | ORAL | 1 refills | Status: DC

## 2022-03-21 MED ORDER — PREDNISONE 10 MG PO TABS: 20.0000 mg | ORAL_TABLET | Freq: Two times a day (BID) | ORAL | 0 refills | Status: DC

## 2022-03-21 MED ORDER — CIPROFLOXACIN HCL 500 MG PO TABS: 500.0000 mg | ORAL_TABLET | Freq: Two times a day (BID) | ORAL | 0 refills | Status: DC

## 2022-03-21 MED ORDER — CIPROFLOXACIN HCL 250 MG PO TABS
500.0000 mg | ORAL_TABLET | Freq: Once | ORAL | Status: AC
  Administered 2022-03-21: 500 mg via ORAL
  Filled 2022-03-21: qty 2

## 2022-03-21 MED ORDER — PREDNISONE 20 MG PO TABS
20.0000 mg | ORAL_TABLET | Freq: Once | ORAL | Status: AC
  Administered 2022-03-21: 20 mg via ORAL
  Filled 2022-03-21: qty 1

## 2022-03-21 NOTE — ED Provider Notes (Signed)
?[facility] ?Provider Note ? ? ?CSN: [id number] ?Arrival date & time: 03/20/22  2118 ? ?  ? ?History ? ?Chief Complaint  ?Patient presents with  ? Sore Throat  ? ? ?[name] is a 46 y.o. female. ? ?Patient is a 46 year old female with past medical history of GERD.  Patient presenting today with complaints of left-sided neck, throat, and ear pain.  This has been worsening for the past 2 days.  She had a similar episode last month and was prescribed amoxicillin.  She took a few of these tablets, but did not tolerate them so did not take anymore.  Her symptoms seem to improve until yesterday.  She describes pain under her left mandible that radiates to her left ear and throat.  It is worse when she swallows.  She denies any difficulty breathing. ? ?The history is provided by the patient.  ? ?  ? ?Home Medications ?Prior to Admission medications   ?Medication Sig Start Date End Date Taking? Authorizing Provider  ?amoxicillin-clavulanate (AUGMENTIN) 875-125 MG tablet Take 1 tablet by mouth every 12 (twelve) hours. 01/23/22   Volney American, PA-C  ?azithromycin (ZITHROMAX) 250 MG tablet Take 1 tablet (250 mg total) by mouth daily. Take first 2 tablets together, then 1 every day until finished. 01/23/22   Volney American, PA-C  ?erythromycin ophthalmic ointment Place a 1/2 inch ribbon of ointment into the right lower eyelid BID prn. 01/23/22   Volney American, PA-C  ?fluconazole (DIFLUCAN) 150 MG tablet Take 1 tablet (150 mg total) by mouth once a week. 01/23/22   Volney American, PA-C  ?HYDROcodone-acetaminophen (NORCO/VICODIN) 5-325 MG tablet Take 1 tablet by mouth every 4 (four) hours as needed. 01/24/22   Orpah Greek, MD  ?lidocaine (XYLOCAINE) 2 % solution Use as directed 10 mLs in the mouth or throat every 3 (three) hours as needed for mouth pain. 01/23/22   Volney American, PA-C  ?loratadine-pseudoephedrine (CLARITIN-D 24 HOUR) 10-240 MG 24 hr  tablet Take 1 tablet by mouth daily. 01/24/22   Orpah Greek, MD  ?meclizine (ANTIVERT) 25 MG tablet Take 1 tablet (25 mg total) by mouth 3 (three) times daily as needed for dizziness. ?Patient not taking: Reported on 12/23/2021 05/03/20   Deatra James, MD  ?omeprazole (PRILOSEC) 20 MG capsule Take 20 mg as needed by mouth.  ?Patient not taking: Reported on 05/03/2020    [provider]  ?predniSONE (DELTASONE) 10 MG tablet 5, 4, 3, 2 then 1 tablet by mouth daily for 5 days total. 07/22/21   Evalee Jefferson, PA-C  ?   ? ?Allergies    ?Tamiflu [oseltamivir phosphate], Levofloxacin, and Sulfonamide derivatives   ? ?Review of Systems   ?Review of Systems  ?All other systems reviewed and are negative. ? ?Physical Exam ?Updated Vital Signs ?BP 139/85 (BP Location: Right Arm)   Pulse 87   Temp 98 ?F (36.7 ?C) (Oral)   Resp 16   LMP 06/03/2017   SpO2 100%  ?Physical Exam ?Vitals and nursing note reviewed.  ?Constitutional:   ?   General: She is not in acute distress. ?   Appearance: She is well-developed. She is not diaphoretic.  ?HENT:  ?   Head: Normocephalic and atraumatic.  ?   Right Ear: Tympanic membrane normal. No tenderness.  ?   Left Ear: Tympanic membrane normal. No tenderness.  ?   Mouth/Throat:  ?   Mouth: Mucous membranes are moist. No oral lesions.  ?  Pharynx: No pharyngeal swelling, oropharyngeal exudate or posterior oropharyngeal erythema.  ?   Tonsils: No tonsillar exudate or tonsillar abscesses.  ?Cardiovascular:  ?   Rate and Rhythm: Normal rate and regular rhythm.  ?   Heart sounds: No murmur heard. ?  No friction rub. No gallop.  ?Pulmonary:  ?   Effort: Pulmonary effort is normal. No respiratory distress.  ?   Breath sounds: Normal breath sounds. No wheezing.  ?Abdominal:  ?   General: Bowel sounds are normal. There is no distension.  ?   Palpations: Abdomen is soft.  ?   Tenderness: There is no abdominal tenderness.  ?Musculoskeletal:     ?   General: Normal range of motion.  ?    Cervical back: Normal range of motion and neck supple.  ?Skin: ?   General: Skin is warm and dry.  ?Neurological:  ?   General: No focal deficit present.  ?   Mental Status: She is alert and oriented to person, place, and time.  ? ? ?ED Results / Procedures / Treatments   ?Labs ?(all labs ordered are listed, but only abnormal results are displayed) ?Labs Reviewed  ?GROUP A STREP BY PCR  ? ? ?EKG ?None ? ?Radiology ?No results found. ? ?Procedures ?Procedures  ? ? ?Medications Ordered in ED ?Medications  ?ciprofloxacin (CIPRO) tablet 500 mg (has no administration in time range)  ?predniSONE (DELTASONE) tablet 20 mg (has no administration in time range)  ? ? ?ED Course/ Medical Decision Making/ A&P ? ?Patient presenting with complaints of pain under her left mandible extending to her ear and throat, the etiology of which I am uncertain.  Her strep test is negative, but still could be an infectious etiology.  I palpate and see nothing on exam of significant concern.  I will prescribe an antibiotic and prednisone and see if this helps.  Patient is to follow-up with ENT if not improving in the next week. ? ?Final Clinical Impression(s) / ED Diagnoses ?Final diagnoses:  ?None  ? ? ?Rx / DC Orders ?ED Discharge Orders   ? ? None  ? ?  ? ? ?  ?Veryl Speak, MD ?03/21/22 0302 ? ?

## 2022-03-21 NOTE — Discharge Instructions (Signed)
Begin taking Cipro and prednisone as prescribed. ? ?Take ibuprofen 600 mg every 6 hours as needed for pain. ? ?Follow-up with ear nose and throat if symptoms are not improving in the next 3 to 4 days.  The contact information for [facility] has been provided in this discharge summary for you to call and make these arrangements. ?

## 2022-04-16 ENCOUNTER — Encounter: Payer: Self-pay | Admitting: *Deleted

## 2022-04-16 ENCOUNTER — Ambulatory Visit (INDEPENDENT_AMBULATORY_CARE_PROVIDER_SITE_OTHER): Payer: BC Managed Care – PPO

## 2022-04-16 ENCOUNTER — Ambulatory Visit
Admission: EM | Admit: 2022-04-16 | Discharge: 2022-04-16 | Disposition: A | Discharge status: Home / Self Care | Payer: BC Managed Care – PPO | Attending: Family Medicine | Admitting: Family Medicine

## 2022-04-16 DIAGNOSIS — M25522 Pain in left elbow: Secondary | ICD-10-CM

## 2022-04-16 DIAGNOSIS — M25512 Pain in left shoulder: Secondary | ICD-10-CM

## 2022-04-16 DIAGNOSIS — M79602 Pain in left arm: Secondary | ICD-10-CM | POA: Diagnosis not present

## 2022-04-16 MED ORDER — CYCLOBENZAPRINE HCL 5 MG PO TABS: 5.0000 mg | ORAL_TABLET | Freq: Three times a day (TID) | ORAL | 0 refills | Status: DC | PRN

## 2022-04-16 MED ORDER — NAPROXEN 500 MG PO TABS: 500.0000 mg | ORAL_TABLET | Freq: Two times a day (BID) | ORAL | 0 refills | Status: DC | PRN

## 2022-04-16 NOTE — ED Triage Notes (Signed)
Pt states that on Monday evening she injured her left arm while trying to sit in the floor  Pt states she was trying to use her arm to sit down and her arm slipped in between the bed and the mattress  Denies Meds

## 2022-04-20 NOTE — ED Provider Notes (Signed)
[facility]    CSN: [id number] Arrival date & time: 04/16/22  1647      History   Chief Complaint Chief Complaint  Patient presents with   Arm Injury    Left arm injury 2 days ago    HPI [name] is a 46 y.o. female.   Presenting today with 2 day history of worsening left arm pain from elbow to shoulder after slipping on her bed and the arm falling between the bedframe and mattress. She denies loss of ROM, numbness, tingling, swelling, skin injury. Arm has become increasingly more stiff and she has minimal ROM due to pain. Trying supportive measures at home with no relief.     Past Medical History:  Diagnosis Date   Anemia    GERD (gastroesophageal reflux disease)    Helicobacter pylori (H. pylori)    prevpac   Hemorrhoids     Patient Active Problem List   Diagnosis Date Noted   Dizziness 05/03/2020   Monilial vaginitis 09/20/2017   S/P abdominal supracervical subtotal hysterectomy 06/03/2017   GERD 03/07/2010   EPIGASTRIC PAIN 78/29/5621   HELICOBACTER PYLORI INFECTION, HX OF 03/07/2010    Past Surgical History:  Procedure Laterality Date   ABDOMINAL HYSTERECTOMY     APPENDECTOMY     46 years old   ESOPHAGOGASTRODUODENOSCOPY  09/14/2007   Dr. Hurley Cisco quadrant distal esophageal erosion consistant with  moderately severe erosive reflux esophagitits, o/w normal esophagus.,tiny antral erosions o/w normal stomach, inflammation on bx   ESOPHAGOGASTRODUODENOSCOPY (EGD) WITH ESOPHAGEAL DILATION  12/09/2012   Dr. Gala Romney- normal esophagus s/p maloney dilation, small hiatal hernia   SALPINGOOPHORECTOMY Right 06/03/2017   Procedure: SALPINGO OOPHORECTOMY;  Surgeon: Florian Buff, MD;  Location: [facility];  Service: Gynecology;  Laterality: Right;   SUPRACERVICAL ABDOMINAL HYSTERECTOMY N/A 06/03/2017   Procedure: HYSTERECTOMY SUPRACERVICAL ABDOMINAL;  Surgeon: Florian Buff, MD;  Location: [facility];  Service: Gynecology;  Laterality: N/A;   UNILATERAL  SALPINGECTOMY Left 06/03/2017   Procedure: left salpingectomy;  Surgeon: Florian Buff, MD;  Location: [facility];  Service: Gynecology;  Laterality: Left;    OB History     Gravida      Para      Term      Preterm      AB      Living  0      SAB      IAB      Ectopic      Multiple      Live Births               Home Medications    Prior to Admission medications   Medication Sig Start Date End Date Taking? Authorizing Provider  cyclobenzaprine (FLEXERIL) 5 MG tablet Take 1 tablet (5 mg total) by mouth 3 (three) times daily as needed for muscle spasms. Do not drink alcohol or drive while taking this medication.  May cause drowsiness. 04/16/22  Yes Volney American, PA-C  naproxen (NAPROSYN) 500 MG tablet Take 1 tablet (500 mg total) by mouth 2 (two) times daily as needed. 04/16/22  Yes Volney American, PA-C  amoxicillin-clavulanate (AUGMENTIN) 875-125 MG tablet Take 1 tablet by mouth every 12 (twelve) hours. 01/23/22   Volney American, PA-C  azithromycin (ZITHROMAX) 250 MG tablet Take 1 tablet (250 mg total) by mouth daily. Take first 2 tablets together, then 1 every day until finished. 01/23/22   Volney American, PA-C  ciprofloxacin (CIPRO) 500 MG  tablet Take 1 tablet (500 mg total) by mouth 2 (two) times daily. One po bid x 7 days 03/21/22   Veryl Speak, MD  erythromycin ophthalmic ointment Place a 1/2 inch ribbon of ointment into the right lower eyelid BID prn. 01/23/22   Volney American, PA-C  fluconazole (DIFLUCAN) 150 MG tablet Take 1 tablet (150 mg total) by mouth daily. 03/21/22   Veryl Speak, MD  HYDROcodone-acetaminophen (NORCO/VICODIN) 5-325 MG tablet Take 1 tablet by mouth every 4 (four) hours as needed. 01/24/22   Orpah Greek, MD  lidocaine (XYLOCAINE) 2 % solution Use as directed 10 mLs in the mouth or throat every 3 (three) hours as needed for mouth pain. 01/23/22   Volney American, PA-C   loratadine-pseudoephedrine (CLARITIN-D 24 HOUR) 10-240 MG 24 hr tablet Take 1 tablet by mouth daily. 01/24/22   Orpah Greek, MD  meclizine (ANTIVERT) 25 MG tablet Take 1 tablet (25 mg total) by mouth 3 (three) times daily as needed for dizziness. Patient not taking: Reported on 12/23/2021 05/03/20   Deatra James, MD  omeprazole (PRILOSEC) 20 MG capsule Take 20 mg as needed by mouth.  Patient not taking: Reported on 05/03/2020    [provider]  predniSONE (DELTASONE) 10 MG tablet Take 2 tablets (20 mg total) by mouth 2 (two) times daily. 03/21/22   Veryl Speak, MD    Family History No family history on file.  Social History Social History   Tobacco Use   Smoking status: Never    Passive exposure: Never   Smokeless tobacco: Never  Vaping Use   Vaping Use: Never used  Substance Use Topics   Alcohol use: No   Drug use: No     Allergies   Tamiflu [oseltamivir phosphate], Levofloxacin, and Sulfonamide derivatives   Review of Systems Review of Systems PER HPI  Physical Exam Triage Vital Signs ED Triage Vitals  Enc Vitals Group     BP 04/16/22 1655 (!) 160/95     Pulse Rate 04/16/22 1655 83     Resp 04/16/22 1655 18     Temp 04/16/22 1655 98.1 F (36.7 C)     Temp Source 04/16/22 1655 Oral     SpO2 04/16/22 1655 99 %     Weight --      Height --      Head Circumference --      Peak Flow --      Pain Score 04/16/22 1658 9     Pain Loc --      Pain Edu? --      Excl. in [place]? --    No data found.  Updated Vital Signs BP 131/85 (BP Location: Right Arm)   Pulse 100   Temp 98.1 F (36.7 C) (Oral)   Resp 18   LMP 06/03/2017   SpO2 99%   Visual Acuity Right Eye Distance:   Left Eye Distance:   Bilateral Distance:    Right Eye Near:   Left Eye Near:    Bilateral Near:     Physical Exam Vitals and nursing note reviewed.  Constitutional:      Appearance: Normal appearance. She is not ill-appearing.  HENT:     Head: Atraumatic.   Eyes:     Extraocular Movements: Extraocular movements intact.     Conjunctiva/sclera: Conjunctivae normal.  Cardiovascular:     Rate and Rhythm: Normal rate and regular rhythm.     Heart sounds: Normal heart sounds.  Pulmonary:  Effort: Pulmonary effort is normal.     Breath sounds: Normal breath sounds.  Musculoskeletal:        General: Tenderness and signs of injury present. No swelling or deformity.     Cervical back: Normal range of motion and neck supple.     Comments: ROM exam LUE limited due to significant pain and guarding. No bony deformities palpable and joints of LUE appear stable and intact. Grip strength full and equal b/l hands  Skin:    General: Skin is warm and dry.     Findings: No bruising or erythema.  Neurological:     Mental Status: She is alert and oriented to person, place, and time.  Psychiatric:        Mood and Affect: Mood normal.        Thought Content: Thought content normal.        Judgment: Judgment normal.     Comments: LUE neurovascularly intact      UC Treatments / Results  Labs (all labs ordered are listed, but only abnormal results are displayed) Labs Reviewed - No data to display  EKG   Radiology No results found.  Procedures Procedures (including critical care time)  Medications Ordered in UC Medications - No data to display  Initial Impression / Assessment and Plan / UC Course  I have reviewed the triage vital signs and the nursing notes.  Pertinent labs & imaging results that were available during my care of the patient were reviewed by me and considered in my medical decision making (see chart for details).     X-ray neg for acute bony abnormality LUE, suspect strain. Will place in sling for comfort and discussed naproxen, flexeril, supportive home care. Return for worsening sxs.  Final Clinical Impressions(s) / UC Diagnoses   Final diagnoses:  Left arm pain   Discharge Instructions   None    ED Prescriptions      Medication Sig Dispense Auth. Provider   cyclobenzaprine (FLEXERIL) 5 MG tablet Take 1 tablet (5 mg total) by mouth 3 (three) times daily as needed for muscle spasms. Do not drink alcohol or drive while taking this medication.  May cause drowsiness. 15 tablet Volney American, [place]   naproxen (NAPROSYN) 500 MG tablet Take 1 tablet (500 mg total) by mouth 2 (two) times daily as needed. 30 tablet Volney American, [place]      PDMP not reviewed this encounter.   Volney American, [place] 04/20/22 2104

## 2022-04-24 ENCOUNTER — Ambulatory Visit: Payer: BC Managed Care – PPO

## 2022-04-25 ENCOUNTER — Ambulatory Visit: Payer: BC Managed Care – PPO | Admitting: Orthopedic Surgery

## 2022-04-25 ENCOUNTER — Encounter: Payer: Self-pay | Admitting: Orthopedic Surgery

## 2022-04-25 VITALS — BP 122/82 | HR 85 | Ht 62.0 in | Wt 179.0 lb

## 2022-04-25 DIAGNOSIS — M25512 Pain in left shoulder: Secondary | ICD-10-CM

## 2022-04-25 NOTE — Progress Notes (Signed)
New Patient Visit  Assessment: [name] is a 46 y.o. female with the following: 1. Acute pain of left shoulder  Plan: [name] has pain in her left shoulder.  She has restricted range of motion.  Radiographs are negative.  She may have sustained an injury to her rotator cuff, but she also states that the pain has improved over the past few days.  At this point, it is difficult to determine exactly what is going on.  She is in a lot of discomfort with movement.  Encouraged her to continue working on range of motion, using heat and or ice, and medications as needed.  She is not interested in taking medications, but we did discuss the appropriate medications.  She is not interested in an injection at this time.  I recommended that we allow things to settle a little bit more, and we will see her back in 3 weeks.  At that time, we can discuss further treatment options in more detail.  Follow-up: Return in about 3 weeks (around 05/16/2022).  Subjective:  Chief Complaint  Patient presents with   New Patient (Initial Visit)   Shoulder Pain    LT/started 04/22/22/ DOI 04/21/22 patient fell but pain didn't start until 04/22/22   Elbow Pain    LT/ pain started 04/22/22 DOI 04/21/22    History of Present Illness: [name] is a 46 y.o. female who presents for evaluation of left shoulder pain.  Just a few days ago, she was at home, and she reports stumbled.  She tried to catch herself.  Initially, she had no pain, however the next morning she states she woke up and had a lot of pain in the left shoulder and her elbow.  When she hurt her shoulder, she did not note a pulling or tearing sensation.  She has been taking some naproxen, but not on a consistent basis.  She presented to urgent care, where radiographs were negative.  At this point, she continues to have difficulty with overhead motion.  She does note some gradual improvements overall.   Review of Systems: No fevers or  chills No numbness or tingling No chest pain No shortness of breath No bowel or bladder dysfunction No GI distress No headaches   Medical History:  Past Medical History:  Diagnosis Date   Anemia    GERD (gastroesophageal reflux disease)    Helicobacter pylori (H. pylori)    prevpac   Hemorrhoids     Past Surgical History:  Procedure Laterality Date   ABDOMINAL HYSTERECTOMY     APPENDECTOMY     46 years old   ESOPHAGOGASTRODUODENOSCOPY  09/14/2007   Dr. Hurley Cisco quadrant distal esophageal erosion consistant with  moderately severe erosive reflux esophagitits, o/w normal esophagus.,tiny antral erosions o/w normal stomach, inflammation on bx   ESOPHAGOGASTRODUODENOSCOPY (EGD) WITH ESOPHAGEAL DILATION  12/09/2012   Dr. Gala Romney- normal esophagus s/p maloney dilation, small hiatal hernia   SALPINGOOPHORECTOMY Right 06/03/2017   Procedure: SALPINGO OOPHORECTOMY;  Surgeon: Florian Buff, MD;  Location: [facility];  Service: Gynecology;  Laterality: Right;   SUPRACERVICAL ABDOMINAL HYSTERECTOMY N/A 06/03/2017   Procedure: HYSTERECTOMY SUPRACERVICAL ABDOMINAL;  Surgeon: Florian Buff, MD;  Location: [facility];  Service: Gynecology;  Laterality: N/A;   UNILATERAL SALPINGECTOMY Left 06/03/2017   Procedure: left salpingectomy;  Surgeon: Florian Buff, MD;  Location: [facility];  Service: Gynecology;  Laterality: Left;    History reviewed. No pertinent family history. Social History   Tobacco  Use   Smoking status: Never    Passive exposure: Never   Smokeless tobacco: Never  Vaping Use   Vaping Use: Never used  Substance Use Topics   Alcohol use: No   Drug use: No    Allergies  Allergen Reactions   Tamiflu [Oseltamivir Phosphate] Other (See Comments)    Anxiety, insomnia, shaky, vomiting   Levofloxacin     Whole body sensation of heat (no urticaria).   Sulfonamide Derivatives Hives    Current Meds  Medication Sig   ciprofloxacin (CIPRO) 500 MG tablet Take 1 tablet (500 mg total) by  mouth 2 (two) times daily. One po bid x 7 days   cyclobenzaprine (FLEXERIL) 5 MG tablet Take 1 tablet (5 mg total) by mouth 3 (three) times daily as needed for muscle spasms. Do not drink alcohol or drive while taking this medication.  May cause drowsiness.   erythromycin ophthalmic ointment Place a 1/2 inch ribbon of ointment into the right lower eyelid BID prn.   fluconazole (DIFLUCAN) 150 MG tablet Take 1 tablet (150 mg total) by mouth daily.   lidocaine (XYLOCAINE) 2 % solution Use as directed 10 mLs in the mouth or throat every 3 (three) hours as needed for mouth pain.   naproxen (NAPROSYN) 500 MG tablet Take 1 tablet (500 mg total) by mouth 2 (two) times daily as needed.    Objective: BP 122/82   Pulse 85   Ht '5\' 2"'$  (1.575 m)   Wt 179 lb (81.2 kg)   LMP 06/03/2017   BMI 32.74 kg/m   Physical Exam:  General: Alert and oriented. and No acute distress. Gait: Normal gait.  Left shoulder without deformity.  No swelling is appreciated.  No bruising.  Forward flexion limited to 90 degrees.  Passively I can get her beyond this, but it is painful.  Positive drop arm testing.  Tenderness to palpation of the anterior shoulder, as well as the lateral shoulder.  Fingers are warm and well-perfused.  IMAGING: I personally reviewed images previously obtained from the ED  Left shoulder x-ray obtained prior to today's visit demonstrates no acute injuries.  Glenohumeral joint is reduced.  Proximal humeral migration.  No arthritis.  New Medications:  No orders of the defined types were placed in this encounter.     Mordecai Rasmussen, MD  04/25/2022 1:30 PM

## 2022-04-25 NOTE — Patient Instructions (Signed)
Pendulum   Stand near a wall or a surface that you can hold onto for balance. Bend at the waist and let your left / right arm hang straight down. Use your other arm to support you. Keep your back straight and do not lock your knees. Relax your left / right arm and shoulder muscles, and move your hips and your trunk so your left / right arm swings freely. Your arm should swing because of the motion of your body, not because you are using your arm or shoulder muscles. Keep moving your hips and trunk so your arm swings in the following directions, as told by your health care provider: Side to side. Forward and backward. In clockwise and counterclockwise circles. Continue each motion for 20 seconds, or for as long as told by your health care provider. Slowly return to the starting position.  Repeat 10 times. Complete this exercise daily.    We have discussed taking over the counter medications for your pain.  Below are some common medicines to consider.  Also listed are the recommended doses and how to take these medications as a prescription strength medicine.  If you experience any issues or side effects, please discontinue the medicine and contact the office for some assistance.   Tylenol or acetaminophen - can be used to help treat minor pains and fevers or chills.  Common doses include 350 mg, 500 mg and 650 mg.  Following surgery, I recommend taking 1000 mg of this medication three times a day.  Some narcotic pain medications contain acetaminophen, so you cannot take additional acetaminophen. This medicine can be bad for your liver.  DO NOT TAKE MORE THAN 4000 mg per day.  Advil, Motrin or Ibuprofen - anti-inflammatory medicines.  Can be used to treat chronic pain and help with swelling.  Common over the counter dose includes 200 mg.  Bottle states you can take 1-2 pills.  Common prescription doses include 600 mg or 800 mg pills.  This medication can be hard on your stomach and your kidneys.   If you have a history of stomach ulcers or kidney issues, you should discuss this with your PCP.  This medicine can be taken 3 times per day.  You should take this medication with food in your stomach.   Aleve or Naproxen - anti inflammatory medicines.  Can be used to treat chronic pain and help with swelling. Common over the counter dose is 220 mg.  The bottle states you can take 1 pill, twice a day.   Typical prescription dose is 500 mg two times a day.  This medication can be hard on your stomach and your kidneys.  If you have a history of stomach ulcers or kidney issues, you should discuss this with your PCP.  This medicine can be taken 2 times per day.  You should take this medication with food in your stomach.    Acetaminopen and ibuprofen/naproxen can be taking at the same time, but please do not take ibuprofen and naproxen at the same time.  These medications work the same way and can cause further injury to your stomach and/or kidneys.    Patients taking a blood thinner are often encouraged not to take NSAIDs or medications like ibuprofen or naproxen because they increase the possibility of internal bleeding.  If you have any questions, you should contact the doctor who recommended these medicines.

## 2022-05-06 ENCOUNTER — Encounter: Payer: Self-pay | Admitting: Internal Medicine

## 2022-05-07 NOTE — Progress Notes (Signed)
Okay to schedule. ASA 2 

## 2022-05-08 ENCOUNTER — Encounter: Payer: Self-pay | Admitting: *Deleted

## 2022-05-08 MED ORDER — PEG 3350-KCL-NA BICARB-NACL 420 G PO SOLR: ORAL | 0 refills | Status: DC

## 2022-05-08 NOTE — Progress Notes (Signed)
Spoke with pt. Scheduled for 7/21 at 2pm. Aware will mail instructions. Rx sent to [facility].

## 2022-05-20 ENCOUNTER — Ambulatory Visit: Payer: BC Managed Care – PPO | Admitting: Orthopedic Surgery

## 2022-05-23 ENCOUNTER — Telehealth: Payer: Self-pay | Admitting: Internal Medicine

## 2022-05-23 NOTE — Telephone Encounter (Signed)
Pt LMOM that she had question about her procedure with RMR on 7/21.  [phone]

## 2022-05-23 NOTE — Telephone Encounter (Signed)
Patient called in. Stated her PCP stated she has what looks like a "skin tag" at her rectum. She has procedure for TCS scheduled 7/21 and she wanted to make [place] aware. FYI to Dr. Gala Romney

## 2022-05-30 ENCOUNTER — Ambulatory Visit (HOSPITAL_COMMUNITY): Payer: BC Managed Care – PPO | Admitting: Anesthesiology

## 2022-05-30 ENCOUNTER — Other Ambulatory Visit: Payer: Self-pay

## 2022-05-30 ENCOUNTER — Encounter (HOSPITAL_COMMUNITY)
Admission: RE | Disposition: A | Discharge status: Home / Self Care | Payer: Self-pay | Source: Ambulatory Visit | Attending: Internal Medicine

## 2022-05-30 ENCOUNTER — Ambulatory Visit (HOSPITAL_COMMUNITY)
Admission: RE | Admit: 2022-05-30 | Discharge: 2022-05-30 | Disposition: A | Discharge status: Home / Self Care | Payer: BC Managed Care – PPO | Source: Ambulatory Visit | Attending: Internal Medicine | Admitting: Internal Medicine

## 2022-05-30 DIAGNOSIS — K643 Fourth degree hemorrhoids: Secondary | ICD-10-CM | POA: Insufficient documentation

## 2022-05-30 DIAGNOSIS — K219 Gastro-esophageal reflux disease without esophagitis: Secondary | ICD-10-CM | POA: Diagnosis not present

## 2022-05-30 DIAGNOSIS — Z1211 Encounter for screening for malignant neoplasm of colon: Secondary | ICD-10-CM | POA: Diagnosis present

## 2022-05-30 DIAGNOSIS — K635 Polyp of colon: Secondary | ICD-10-CM | POA: Diagnosis not present

## 2022-05-30 DIAGNOSIS — K5730 Diverticulosis of large intestine without perforation or abscess without bleeding: Secondary | ICD-10-CM | POA: Insufficient documentation

## 2022-05-30 DIAGNOSIS — Z1212 Encounter for screening for malignant neoplasm of rectum: Secondary | ICD-10-CM | POA: Diagnosis not present

## 2022-05-30 DIAGNOSIS — D120 Benign neoplasm of cecum: Secondary | ICD-10-CM | POA: Diagnosis not present

## 2022-05-30 HISTORY — PX: COLONOSCOPY WITH PROPOFOL: SHX5780

## 2022-05-30 HISTORY — PX: POLYPECTOMY: SHX5525

## 2022-05-30 HISTORY — PX: BIOPSY: SHX5522

## 2022-05-30 SURGERY — COLONOSCOPY WITH PROPOFOL
Anesthesia: General

## 2022-05-30 MED ORDER — LACTATED RINGERS IV SOLN: INTRAVENOUS | Status: DC | PRN

## 2022-05-30 MED ORDER — LIDOCAINE HCL (CARDIAC) PF 50 MG/5ML IV SOSY
PREFILLED_SYRINGE | INTRAVENOUS | Status: DC | PRN
  Administered 2022-05-30: 50 mg via INTRAVENOUS

## 2022-05-30 MED ORDER — LACTATED RINGERS IV SOLN: INTRAVENOUS | Status: DC

## 2022-05-30 MED ORDER — PROPOFOL 10 MG/ML IV BOLUS
INTRAVENOUS | Status: DC | PRN
  Administered 2022-05-30: 120 mg via INTRAVENOUS
  Administered 2022-05-30 (×2): 50 mg via INTRAVENOUS
  Administered 2022-05-30: 20 mg via INTRAVENOUS
  Administered 2022-05-30 (×2): 30 mg via INTRAVENOUS

## 2022-05-30 NOTE — Discharge Instructions (Addendum)
  Colonoscopy Discharge Instructions  Read the instructions outlined below and refer to this sheet in the next few weeks. These discharge instructions provide you with general information on caring for yourself after you leave the hospital. Your doctor may also give you specific instructions. While your treatment has been planned according to the most current medical practices available, unavoidable complications occasionally occur. If you have any problems or questions after discharge, call Dr. Gala Romney at [phone]. ACTIVITY You may resume your regular activity, but move at a slower pace for the next 24 hours.  Take frequent rest periods for the next 24 hours.  Walking will help get rid of the air and reduce the bloated feeling in your belly (abdomen).  No driving for 24 hours (because of the medicine (anesthesia) used during the test).   Do not sign any important legal documents or operate any machinery for 24 hours (because of the anesthesia used during the test).  NUTRITION Drink plenty of fluids.  You may resume your normal diet as instructed by your doctor.  Begin with a light meal and progress to your normal diet. Heavy or fried foods are harder to digest and may make you feel sick to your stomach (nauseated).  Avoid alcoholic beverages for 24 hours or as instructed.  MEDICATIONS You may resume your normal medications unless your doctor tells you otherwise.  WHAT YOU CAN EXPECT TODAY Some feelings of bloating in the abdomen.  Passage of more gas than usual.  Spotting of blood in your stool or on the toilet paper.  IF YOU HAD POLYPS REMOVED DURING THE COLONOSCOPY: No aspirin products for 7 days or as instructed.  No alcohol for 7 days or as instructed.  Eat a soft diet for the next 24 hours.  FINDING OUT THE RESULTS OF YOUR TEST Not all test results are available during your visit. If your test results are not back during the visit, make an appointment with your caregiver to find out the  results. Do not assume everything is normal if you have not heard from your caregiver or the medical facility. It is important for you to follow up on all of your test results.  SEEK IMMEDIATE MEDICAL ATTENTION IF: You have more than a spotting of blood in your stool.  Your belly is swollen (abdominal distention).  You are nauseated or vomiting.  You have a temperature over 101.  You have abdominal pain or discomfort that is severe or gets worse throughout the day.    1 colon polyp removed from your colon.  Your ileocecal valve (where your small intestine connects to your colon appeared abnormal.  Biopsies of this were taken.  You have a hypertrophied external hemorrhoid with papilla; if this is bothering you, I recommend you see a general surgeon to have this removed.  Likely an office procedure.  Further recommendations to follow pending review of pathology report  At the patient request, I called Cleon Gustin at [phone]

## 2022-05-30 NOTE — Anesthesia Postprocedure Evaluation (Signed)
Anesthesia Post Note  Patient: [name]  Procedure(s) Performed: COLONOSCOPY WITH PROPOFOL POLYPECTOMY BIOPSY  Patient location during evaluation: Phase II Anesthesia Type: General Level of consciousness: awake and alert and oriented Pain management: pain level controlled Vital Signs Assessment: post-procedure vital signs reviewed and stable Respiratory status: spontaneous breathing, nonlabored ventilation and respiratory function stable Cardiovascular status: blood pressure returned to baseline and stable Postop Assessment: no apparent nausea or vomiting Anesthetic complications: no   No notable events documented.   Last Vitals:  Vitals:   05/30/22 1251 05/30/22 1333  BP: 128/84 111/69  Pulse: 97   Resp: 16 19  Temp: 36.8 Melissa 36.7 Melissa  SpO2: 98% 97%    Last Pain:  Vitals:   05/30/22 1336  TempSrc:   PainSc: 0-No pain                 [name] [name]

## 2022-05-30 NOTE — Op Note (Signed)
[facility] Patient Name: [name] Procedure Date: 05/30/2022 12:52 PM MRN: [id number] Date of Birth: [birth year] Attending MD: Norvel Richards , MD CSN: [id number] Age: 46 Admit Type: Outpatient Procedure:                Colonoscopy Indications:              Screening for colorectal malignant neoplasm Providers:                Norvel Richards, MD, Rosina Lowenstein, RN, Raphael Gibney, Technician Referring MD:              Medicines:                Propofol per Anesthesia Complications:            No immediate complications. Estimated Blood Loss:     Estimated blood loss was minimal. Procedure:                Pre-Anesthesia Assessment:                           - Prior to the procedure, a History and Physical                            was performed, and patient medications and                            allergies were reviewed. The patient's tolerance of                            previous anesthesia was also reviewed. The risks                            and benefits of the procedure and the sedation                            options and risks were discussed with the patient.                            All questions were answered, and informed consent                            was obtained. Prior Anticoagulants: The patient has                            taken no previous anticoagulant or antiplatelet                            agents. ASA Grade Assessment: II - A patient with                            mild systemic disease. After reviewing the risks  and benefits, the patient was deemed in                            satisfactory condition to undergo the procedure.                           After obtaining informed consent, the colonoscope                            was passed under direct vision. Throughout the                            procedure, the patient's blood pressure, pulse, and                             oxygen saturations were monitored continuously. The                            [phone]) scope was introduced through                            the anus and advanced to the 10 cm into the ileum.                            The colonoscopy was performed without difficulty.                            The patient tolerated the procedure well. The                            quality of the bowel preparation was adequate. The                            terminal ileum, ileocecal valve, appendiceal                            orifice, and rectum were photographed. The entire                            colon was well visualized. Scope In: 1:10:51 PM Scope Out: 1:29:08 PM Scope Withdrawal Time: 0 hours 15 minutes 42 seconds  Total Procedure Duration: 0 hours 18 minutes 17 seconds  Findings:      The perianal and digital rectal examinations revealed grade 4       hemorrhoids with an associated hypertrophied anal papilla (please see       photograph).      A few small-mouthed diverticula were found in the sigmoid colon and       descending colon. (1) 5 mm polyp in the base the cecum. It was cold       snare removed.      Terminal ileum inspected to 10 cm. There was a new 2 x 3 cm area of       irregular nodular mucosa which intermittently ball-valve and out into       the lumen of the colon from  the ileocecal valve. At times it totally       disappeared into the TI. Could be just a prominent lymphoid aggregate.       However it was somewhat suspicious. It was biopsied multiple times.      The exam was otherwise without abnormality on direct and retroflexion       views. Impression:               - Diverticulosis in the sigmoid colon and in the                            descending colon. Cecal polyp removed as described                            above; abnormal ileocecal valve as described?"status                            post biopsy                           -Grade 4  hemorrhoids with hypertrophied anal                            papilla. If this continues to be an aggravation for                            patient, would see a general surgeon for local                            excision.                           - The examination was otherwise normal on direct                            and retroflexion views.                           - Moderate Sedation:      Moderate (conscious) sedation was personally administered by an       anesthesia professional. The following parameters were monitored: oxygen       saturation, heart rate, blood pressure, respiratory rate, EKG, adequacy       of pulmonary ventilation, and response to care. Recommendation:           - Patient has a contact number available for                            emergencies. The signs and symptoms of potential                            delayed complications were discussed with the                            patient. Return to normal activities tomorrow.  Written discharge instructions were provided to the                            patient.                           - Advance diet as tolerated.                           - Continue present medications.                           - Repeat colonoscopy date to be determined after                            pending pathology results are reviewed for                            surveillance.                           - Return to GI office (date not yet determined). Procedure Code(s):        --- Professional ---                           [phone], Colonoscopy, flexible; diagnostic, including                            collection of specimen(s) by brushing or washing,                            when performed (separate procedure) Diagnosis Code(s):        --- Professional ---                           Z12.11, Encounter for screening for malignant                            neoplasm of colon                            K57.30, Diverticulosis of large intestine without                            perforation or abscess without bleeding CPT copyright 2019 [employer]. All rights reserved. The codes documented in this report are preliminary and upon coder review may  be revised to meet current compliance requirements. Cristopher Estimable. Raesha Coonrod, MD Norvel Richards, MD 05/30/2022 1:43:24 PM This report has been signed electronically. Number of Addenda: 0

## 2022-05-30 NOTE — H&P (Signed)
$'@LOGO'F$ @   Primary Care Physician:  Lemmie Evens, MD Primary Gastroenterologist:  Dr. Gala Romney  Pre-Procedure History & Physical: HPI:  [name] is a 46 y.o. female here for first-ever average risk screening colonoscopy.  No bowel symptoms.  No family history of colon cancer.  Past Medical History:  Diagnosis Date   Anemia    GERD (gastroesophageal reflux disease)    Helicobacter pylori (H. pylori)    prevpac   Hemorrhoids     Past Surgical History:  Procedure Laterality Date   ABDOMINAL HYSTERECTOMY     APPENDECTOMY     46 years old   ESOPHAGOGASTRODUODENOSCOPY  09/14/2007   Dr. Hurley Cisco quadrant distal esophageal erosion consistant with  moderately severe erosive reflux esophagitits, o/w normal esophagus.,tiny antral erosions o/w normal stomach, inflammation on bx   ESOPHAGOGASTRODUODENOSCOPY (EGD) WITH ESOPHAGEAL DILATION  12/09/2012   Dr. Gala Romney- normal esophagus s/p maloney dilation, small hiatal hernia   SALPINGOOPHORECTOMY Right 06/03/2017   Procedure: SALPINGO OOPHORECTOMY;  Surgeon: Florian Buff, MD;  Location: [facility];  Service: Gynecology;  Laterality: Right;   SUPRACERVICAL ABDOMINAL HYSTERECTOMY N/A 06/03/2017   Procedure: HYSTERECTOMY SUPRACERVICAL ABDOMINAL;  Surgeon: Florian Buff, MD;  Location: [facility];  Service: Gynecology;  Laterality: N/A;   UNILATERAL SALPINGECTOMY Left 06/03/2017   Procedure: left salpingectomy;  Surgeon: Florian Buff, MD;  Location: [facility];  Service: Gynecology;  Laterality: Left;    Prior to Admission medications   Medication Sig Start Date End Date Taking? Authorizing Provider  cetirizine (ZYRTEC) 10 MG tablet Take 10 mg by mouth daily.   Yes [provider]  fluorometholone (FML) 0.1 % ophthalmic suspension Place 1 drop into both eyes daily as needed (eye irritation).   Yes [provider]  fluticasone (FLONASE) 50 MCG/ACT nasal spray Place 1 spray into both nostrils daily as needed for allergies or rhinitis.    Yes [provider]  Multiple Vitamins-Minerals (MULTIVITAMIN ADULT) CHEW Chew 2 each by mouth daily.   Yes [provider]  polyethylene glycol-electrolytes (NULYTELY) 420 g solution As directed 05/08/22  Yes Jesiel Garate, Cristopher Estimable, MD  ciprofloxacin (CIPRO) 500 MG tablet Take 1 tablet (500 mg total) by mouth 2 (two) times daily. One po bid x 7 days Patient not taking: Reported on 05/06/2022 03/21/22   Veryl Speak, MD  cyclobenzaprine (FLEXERIL) 5 MG tablet Take 1 tablet (5 mg total) by mouth 3 (three) times daily as needed for muscle spasms. Do not drink alcohol or drive while taking this medication.  May cause drowsiness. Patient not taking: Reported on 05/06/2022 04/16/22   Volney American, PA-C  erythromycin ophthalmic ointment Place a 1/2 inch ribbon of ointment into the right lower eyelid BID prn. Patient not taking: Reported on 05/06/2022 01/23/22   Volney American, PA-C  fluconazole (DIFLUCAN) 150 MG tablet Take 1 tablet (150 mg total) by mouth daily. Patient not taking: Reported on 05/06/2022 03/21/22   Veryl Speak, MD  lidocaine (XYLOCAINE) 2 % solution Use as directed 10 mLs in the mouth or throat every 3 (three) hours as needed for mouth pain. Patient not taking: Reported on 05/06/2022 01/23/22   Volney American, PA-C  naproxen (NAPROSYN) 500 MG tablet Take 1 tablet (500 mg total) by mouth 2 (two) times daily as needed. Patient not taking: Reported on 05/06/2022 04/16/22   Volney American, PA-C    Allergies as of 05/08/2022 - Review Complete 04/25/2022  Allergen Reaction Noted   Tamiflu [oseltamivir phosphate] Other (See Comments)  11/27/2016   Levofloxacin  05/21/2017   Sulfonamide derivatives Hives     No family history on file.  Social History   Socioeconomic History   Marital status: Single    Spouse name: Not on file   Number of children: Not on file   Years of education: Not on file   Highest education level: Not on file  Occupational  History   Not on file  Tobacco Use   Smoking status: Never    Passive exposure: Never   Smokeless tobacco: Never  Vaping Use   Vaping Use: Never used  Substance and Sexual Activity   Alcohol use: No   Drug use: No   Sexual activity: Yes    Birth control/protection: Surgical    Comment: hyst  Other Topics Concern   Not on file  Social History Narrative   Not on file   Social Determinants of Health   Financial Resource Strain: Not on file  Food Insecurity: Not on file  Transportation Needs: Not on file  Physical Activity: Not on file  Stress: Not on file  Social Connections: Not on file  Intimate Partner Violence: Not on file    Review of Systems: See HPI, otherwise negative ROS  Physical Exam: BP 128/84   Pulse 97   Temp 98.3 F (36.8 C) (Oral)   Resp 16   Ht '5\' 2"'$  (1.575 m)   Wt 76.2 kg   LMP 06/03/2017   SpO2 98%   BMI 30.73 kg/m  General:   Alert,  Well-developed, well-nourished, pleasant and cooperative in NAD Neck:  Supple; no masses or thyromegaly. No significant cervical adenopathy. Lungs:  Clear throughout to auscultation.   No wheezes, crackles, or rhonchi. No acute distress. Heart:  Regular rate and rhythm; no murmurs, clicks, rubs,  or gallops. Abdomen: Non-distended, normal bowel sounds.  Soft and nontender without appreciable mass or hepatosplenomegaly.  Pulses:  Normal pulses noted. Extremities:  Without clubbing or edema.  Impression/Plan: 46 year old lady here for first-ever average risk screening colonoscopy. The risks, benefits, limitations, alternatives and imponderables have been reviewed with the patient. Questions have been answered. All parties are agreeable.       Notice: This dictation was prepared with Dragon dictation along with smaller phrase technology. Any transcriptional errors that result from this process are unintentional and may not be corrected upon review.

## 2022-05-30 NOTE — Anesthesia Preprocedure Evaluation (Addendum)
Anesthesia Evaluation  Patient identified by MRN, date of birth, ID band Patient awake    Reviewed: Allergy & Precautions, NPO status , Patient's Chart, lab work & pertinent test results  Airway Mallampati: II  TM Distance: >3 FB Neck ROM: Full    Dental  (+) Dental Advisory Given, Teeth Intact   Pulmonary neg pulmonary ROS,    Pulmonary exam normal breath sounds clear to auscultation       Cardiovascular negative cardio ROS Normal cardiovascular exam Rhythm:Regular Rate:Normal     Neuro/Psych negative neurological ROS  negative psych ROS   GI/Hepatic Neg liver ROS, GERD  Controlled,  Endo/Other  negative endocrine ROS  Renal/GU negative Renal ROS  negative genitourinary   Musculoskeletal negative musculoskeletal ROS (+)   Abdominal   Peds negative pediatric ROS (+)  Hematology  (+) Blood dyscrasia, anemia ,   Anesthesia Other Findings   Reproductive/Obstetrics negative OB ROS                            Anesthesia Physical Anesthesia Plan  ASA: 2  Anesthesia Plan: General   Post-op Pain Management: Minimal or no pain anticipated   Induction: Intravenous  PONV Risk Score and Plan: Propofol infusion  Airway Management Planned: Nasal Cannula and Natural Airway  Additional Equipment:   Intra-op Plan:   Post-operative Plan:   Informed Consent: I have reviewed the patients History and Physical, chart, labs and discussed the procedure including the risks, benefits and alternatives for the proposed anesthesia with the patient or authorized representative who has indicated his/her understanding and acceptance.     Dental advisory given  Plan Discussed with: CRNA and Surgeon  Anesthesia Plan Comments:        Anesthesia Quick Evaluation

## 2022-05-30 NOTE — Anesthesia Procedure Notes (Signed)
Date/Time: 05/30/2022 1:02 PM  Performed by: Vista Deck, CRNAPre-anesthesia Checklist: Patient identified, Emergency Drugs available, Suction available, Timeout performed and Patient being monitored Patient Re-evaluated:Patient Re-evaluated prior to induction Oxygen Delivery Method: Nasal Cannula

## 2022-05-30 NOTE — Transfer of Care (Signed)
Immediate Anesthesia Transfer of Care Note  Patient: [name]  Procedure(s) Performed: COLONOSCOPY WITH PROPOFOL POLYPECTOMY BIOPSY  Patient Location: Endoscopy Unit  Anesthesia Type:General  Level of Consciousness: drowsy  Airway & Oxygen Therapy: Patient Spontanous Breathing  Post-op Assessment: Report given to RN and Post -op Vital signs reviewed and stable  Post vital signs: Reviewed and stable  Last Vitals:  Vitals Value Taken Time  BP 111/69 1334  Temp 98.1 1334  Pulse 81 1334  Resp 18 1334  SpO2 97 1334    Last Pain:  Vitals:   05/30/22 1303  TempSrc:   PainSc: 0-No pain      Patients Stated Pain Goal: 9 (56/43/32 9518)  Complications: No notable events documented.

## 2022-06-02 LAB — SURGICAL PATHOLOGY

## 2022-06-03 ENCOUNTER — Encounter: Payer: Self-pay | Admitting: Internal Medicine

## 2022-06-05 ENCOUNTER — Encounter (HOSPITAL_COMMUNITY): Payer: Self-pay | Admitting: Internal Medicine

## 2023-08-27 ENCOUNTER — Ambulatory Visit: Payer: BC Managed Care – PPO | Admitting: Internal Medicine

## 2023-08-27 ENCOUNTER — Encounter: Payer: Self-pay | Admitting: Internal Medicine

## 2023-08-27 VITALS — BP 132/87 | HR 95 | Ht 63.0 in | Wt 173.6 lb

## 2023-08-27 DIAGNOSIS — J302 Other seasonal allergic rhinitis: Secondary | ICD-10-CM | POA: Insufficient documentation

## 2023-08-27 DIAGNOSIS — E785 Hyperlipidemia, unspecified: Secondary | ICD-10-CM | POA: Diagnosis not present

## 2023-08-27 DIAGNOSIS — Z1231 Encounter for screening mammogram for malignant neoplasm of breast: Secondary | ICD-10-CM | POA: Diagnosis not present

## 2023-08-27 DIAGNOSIS — K219 Gastro-esophageal reflux disease without esophagitis: Secondary | ICD-10-CM

## 2023-08-27 NOTE — Assessment & Plan Note (Signed)
Screening mammogram ordered today ?

## 2023-08-27 NOTE — Assessment & Plan Note (Signed)
Previously evaluated by gastroenterology (Dr. Jena Gauss).  She is asymptomatic currently and is not taking any antacid medication on a regular basis.

## 2023-08-27 NOTE — Patient Instructions (Signed)
It was a pleasure to see you today.  Thank you for giving [place] the opportunity to be involved in your care.  Below is a brief recap of your visit and next steps.  We will plan to see you again in 3 months.  Summary You have established care today No medication changes have been made Repeat screening mammogram ordered Follow up with Dr. Despina Hidden for pap smear Follow up with me in 3 months for repeat labs

## 2023-08-27 NOTE — Assessment & Plan Note (Signed)
Reports her PCP told her her cholesterol was high when checked in June.  She has started exercising regularly and has lost 9 pounds. -Follow-up in 3 months for repeat labs

## 2023-08-27 NOTE — Assessment & Plan Note (Signed)
Uses cetirizine and fluticasone as needed for symptom relief

## 2023-08-27 NOTE — Progress Notes (Signed)
New Patient Office Visit  Subjective    Patient ID: [name], female    DOB: 15-Aug-1976  Age: 47 y.o. MRN: [id number]  CC:  Chief Complaint  Patient presents with   Establish Care    HPI [name] presents to establish care.  She is a 47 year old woman who endorses a past medical history significant for GERD, HLD, and seasonal allergies. Previously followed by Dr. Sudie Bailey.  [name] reports feeling well today.  She is asymptomatic and has no acute concerns to discuss aside from desiring to establish care.  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  She is unaware of any significant family medical history.  Chronic medical conditions and outstanding preventative care items discussed today are individually addressed in A/P below.   Outpatient Encounter Medications as of 08/27/2023  Medication Sig   cetirizine (ZYRTEC) 10 MG tablet Take 10 mg by mouth daily.   fluticasone (FLONASE) 50 MCG/ACT nasal spray Place 1 spray into both nostrils daily as needed for allergies or rhinitis.   Multiple Vitamins-Minerals (MULTIVITAMIN ADULT) CHEW Chew 2 each by mouth daily.   [DISCONTINUED] ciprofloxacin (CIPRO) 500 MG tablet Take 1 tablet (500 mg total) by mouth 2 (two) times daily. One po bid x 7 days (Patient not taking: Reported on 05/06/2022)   [DISCONTINUED] cyclobenzaprine (FLEXERIL) 5 MG tablet Take 1 tablet (5 mg total) by mouth 3 (three) times daily as needed for muscle spasms. Do not drink alcohol or drive while taking this medication.  May cause drowsiness. (Patient not taking: Reported on 05/06/2022)   [DISCONTINUED] erythromycin ophthalmic ointment Place a 1/2 inch ribbon of ointment into the right lower eyelid BID prn. (Patient not taking: Reported on 05/06/2022)   [DISCONTINUED] fluconazole (DIFLUCAN) 150 MG tablet Take 1 tablet (150 mg total) by mouth daily. (Patient not taking: Reported on 05/06/2022)    [DISCONTINUED] fluorometholone (FML) 0.1 % ophthalmic suspension Place 1 drop into both eyes daily as needed (eye irritation). (Patient not taking: Reported on 08/27/2023)   [DISCONTINUED] lidocaine (XYLOCAINE) 2 % solution Use as directed 10 mLs in the mouth or throat every 3 (three) hours as needed for mouth pain. (Patient not taking: Reported on 05/06/2022)   [DISCONTINUED] naproxen (NAPROSYN) 500 MG tablet Take 1 tablet (500 mg total) by mouth 2 (two) times daily as needed. (Patient not taking: Reported on 05/06/2022)   [DISCONTINUED] polyethylene glycol-electrolytes (NULYTELY) 420 g solution As directed (Patient not taking: Reported on 08/27/2023)   No facility-administered encounter medications on file as of 08/27/2023.    Past Medical History:  Diagnosis Date   Anemia    GERD (gastroesophageal reflux disease)    Helicobacter pylori (H. pylori)    prevpac   Hemorrhoids     Past Surgical History:  Procedure Laterality Date   ABDOMINAL HYSTERECTOMY     APPENDECTOMY     47 years old   BIOPSY  05/30/2022   Procedure: BIOPSY;  Surgeon: Corbin Ade, MD;  Location: [facility];  Service: Endoscopy;;   COLONOSCOPY WITH PROPOFOL N/A 05/30/2022   Procedure: COLONOSCOPY WITH PROPOFOL;  Surgeon: Corbin Ade, MD;  Location: [facility];  Service: Endoscopy;  Laterality: N/A;  2:00pm   ESOPHAGOGASTRODUODENOSCOPY  09/14/2007   Dr. Isabella Stalling quadrant distal esophageal erosion consistant with  moderately severe erosive reflux esophagitits, o/w normal esophagus.,tiny antral erosions o/w normal stomach, inflammation on bx   ESOPHAGOGASTRODUODENOSCOPY (EGD) WITH ESOPHAGEAL DILATION  12/09/2012  Dr. Jena Gauss- normal esophagus s/p maloney dilation, small hiatal hernia   POLYPECTOMY  05/30/2022   Procedure: POLYPECTOMY;  Surgeon: Corbin Ade, MD;  [facility]: [facility];  Service: Endoscopy;;   SALPINGOOPHORECTOMY Right 06/03/2017   Procedure: SALPINGO OOPHORECTOMY;  Surgeon: Lazaro Arms,  MD;  Location: [facility];  Service: Gynecology;  Laterality: Right;   SUPRACERVICAL ABDOMINAL HYSTERECTOMY N/A 06/03/2017   Procedure: HYSTERECTOMY SUPRACERVICAL ABDOMINAL;  Surgeon: Lazaro Arms, MD;  Location: [facility];  Service: Gynecology;  Laterality: N/A;   UNILATERAL SALPINGECTOMY Left 06/03/2017   Procedure: left salpingectomy;  Surgeon: Lazaro Arms, MD;  Location: [facility];  Service: Gynecology;  Laterality: Left;    History reviewed. No pertinent family history.  Social History   Socioeconomic History   Marital status: Single    Spouse name: Not on file   Number of children: Not on file   Years of education: Not on file   Highest education level: Not on file  Occupational History   Not on file  Tobacco Use   Smoking status: Never    Passive exposure: Never   Smokeless tobacco: Never  Vaping Use   Vaping status: Never Used  Substance and Sexual Activity   Alcohol use: No   Drug use: No   Sexual activity: Yes    Birth control/protection: Surgical    Comment: hyst  Other Topics Concern   Not on file  Social History Narrative   Not on file   Social Determinants of Health   Financial Resource Strain: Not on file  Food Insecurity: Not on file  Transportation Needs: Not on file  Physical Activity: Not on file  Stress: Not on file  Social Connections: Not on file  Intimate Partner Violence: Not on file   Review of Systems  Constitutional:  Negative for chills and fever.  HENT:  Negative for sore throat.   Respiratory:  Negative for cough and shortness of breath.   Cardiovascular:  Negative for chest pain, palpitations and leg swelling.  Gastrointestinal:  Negative for abdominal pain, blood in stool, constipation, diarrhea, nausea and vomiting.  Genitourinary:  Negative for dysuria and hematuria.  Musculoskeletal:  Negative for myalgias.  Skin:  Negative for itching and rash.  Neurological:  Negative for dizziness and headaches.  Psychiatric/Behavioral:  Negative  for depression and suicidal ideas.    Objective    BP 132/87 (BP Location: Right Arm, Patient Position: Sitting, Cuff Size: Normal)   Pulse 95   Ht 5\' 3"  (1.6 m)   Wt 173 lb 9.6 oz (78.7 kg)   LMP 06/03/2017   SpO2 98%   BMI 30.75 kg/m   Physical Exam Vitals reviewed.  Constitutional:      General: She is not in acute distress.    Appearance: Normal appearance. She is not toxic-appearing.  HENT:     Head: Normocephalic and atraumatic.     Right Ear: External ear normal.     Left Ear: External ear normal.     Nose: Nose normal. No congestion or rhinorrhea.     Mouth/Throat:     Mouth: Mucous membranes are moist.     Pharynx: Oropharynx is clear. No oropharyngeal exudate or posterior oropharyngeal erythema.  Eyes:     General: No scleral icterus.    Extraocular Movements: Extraocular movements intact.     Conjunctiva/sclera: Conjunctivae normal.     Pupils: Pupils are equal, round, and reactive to light.  Cardiovascular:     Rate and Rhythm: Normal  rate and regular rhythm.     Pulses: Normal pulses.     Heart sounds: Normal heart sounds. No murmur heard.    No friction rub. No gallop.  Pulmonary:     Effort: Pulmonary effort is normal.     Breath sounds: Normal breath sounds. No wheezing, rhonchi or rales.  Abdominal:     General: Abdomen is flat. Bowel sounds are normal. There is no distension.     Palpations: Abdomen is soft.     Tenderness: There is no abdominal tenderness.  Musculoskeletal:        General: No swelling. Normal range of motion.     Cervical back: Normal range of motion.     Right lower leg: No edema.     Left lower leg: No edema.  Lymphadenopathy:     Cervical: No cervical adenopathy.  Skin:    General: Skin is warm and dry.     Capillary Refill: Capillary refill takes less than 2 seconds.     Coloration: Skin is not jaundiced.  Neurological:     General: No focal deficit present.     Mental Status: She is alert and oriented to person, place,  and time.  Psychiatric:        Mood and Affect: Mood normal.        Behavior: Behavior normal.    Assessment & Plan:   Problem List Items Addressed This Visit       GERD    Previously evaluated by gastroenterology (Dr. Jena Gauss).  She is asymptomatic currently and is not taking any antacid medication on a regular basis.      Hyperlipidemia    Reports her PCP told her her cholesterol was high when checked in June.  She has started exercising regularly and has lost 9 pounds. -Follow-up in 3 months for repeat labs      Breast cancer screening by mammogram - Primary    Screening mammogram ordered today      Seasonal allergies    Uses cetirizine and fluticasone as needed for symptom relief      Return in about 3 months (around 11/27/2023).   Billie Lade, MD

## 2023-11-27 ENCOUNTER — Ambulatory Visit: Payer: 59 | Admitting: Internal Medicine

## 2023-11-27 ENCOUNTER — Encounter: Payer: Self-pay | Admitting: Internal Medicine

## 2023-11-27 VITALS — BP 135/80 | HR 79 | Ht 62.0 in | Wt 179.0 lb

## 2023-11-27 DIAGNOSIS — E669 Obesity, unspecified: Secondary | ICD-10-CM

## 2023-11-27 DIAGNOSIS — E785 Hyperlipidemia, unspecified: Secondary | ICD-10-CM | POA: Diagnosis not present

## 2023-11-27 DIAGNOSIS — Z1159 Encounter for screening for other viral diseases: Secondary | ICD-10-CM

## 2023-11-27 DIAGNOSIS — J3089 Other allergic rhinitis: Secondary | ICD-10-CM

## 2023-11-27 DIAGNOSIS — J309 Allergic rhinitis, unspecified: Secondary | ICD-10-CM | POA: Insufficient documentation

## 2023-11-27 DIAGNOSIS — Z131 Encounter for screening for diabetes mellitus: Secondary | ICD-10-CM | POA: Diagnosis not present

## 2023-11-27 DIAGNOSIS — Z1329 Encounter for screening for other suspected endocrine disorder: Secondary | ICD-10-CM

## 2023-11-27 DIAGNOSIS — R300 Dysuria: Secondary | ICD-10-CM | POA: Insufficient documentation

## 2023-11-27 NOTE — Assessment & Plan Note (Signed)
Her acute concern today is vaginal soreness and mild dysuria.  No change in urinary frequency or quality of urine.  Denies fever/chills.  She attributes her symptoms to switching soaps but would like to rule out UTI.  POC UA obtained today is negative for UTI.

## 2023-11-27 NOTE — Assessment & Plan Note (Signed)
She endorses a prior history of hyperlipidemia.  No previous labs available.  Repeat lipid panel ordered today.  She has started exercising regularly and is focused on dietary changes in an effort to lose weight.  She would like to avoid taking medications.  She expresses an interest in meeting with a dietitian to discuss appropriate dietary habits.  Through shared decision making, a referral to medical nutrition therapy was placed today.

## 2023-11-27 NOTE — Patient Instructions (Signed)
It was a pleasure to see you today.  Thank you for giving [place] the opportunity to be involved in your care.  Below is a brief recap of your visit and next steps.  We will plan to see you again in 3 months.  Summary No medication changes today. Please let me know if a prednisone pack is needed. Repeat labs ordered Follow up in 3 months

## 2023-11-27 NOTE — Progress Notes (Signed)
Established Patient Office Visit  Subjective   Patient ID: [name], female    DOB: 28-Sep-1976  Age: 48 y.o. MRN: [id number]  Chief Complaint  Patient presents with   Care Management    Three month follow up    Vaginal Pain    Vaginal soreness with irritation due to switching soaps   balance    Patient is concerned for inner ear infection due to feeling off balance   [name] returns to care today for routine follow-up.  She was last evaluated by me in October 2024 as a new patient presenting to establish care.  No medication changes were made at that time and 66-month follow-up was arranged.  There have been no acute interval events.  Today she reports feeling fairly well.  Her acute concerns are intermittent dizziness that she attributes to sinus congestion and possible inner ear infection.  She also endorses vaginal irritation and soreness.  She would like to be checked for UTI.  She attributes current GYN symptoms to switching soaps, but would like to rule out UTI.  She does not have any additional concerns to discuss today.  Past Medical History:  Diagnosis Date   Anemia    GERD (gastroesophageal reflux disease)    Helicobacter pylori (H. pylori)    prevpac   Hemorrhoids    Past Surgical History:  Procedure Laterality Date   ABDOMINAL HYSTERECTOMY     APPENDECTOMY     48 years old   BIOPSY  05/30/2022   Procedure: BIOPSY;  Surgeon: Corbin Ade, MD;  [facility];  Service: Endoscopy;;   COLONOSCOPY WITH PROPOFOL N/A 05/30/2022   Procedure: COLONOSCOPY WITH PROPOFOL;  Surgeon: Corbin Ade, MD;  [facility]: [facility];  Service: Endoscopy;  Laterality: N/A;  2:00pm   ESOPHAGOGASTRODUODENOSCOPY  09/14/2007   Dr. Isabella Stalling quadrant distal esophageal erosion consistant with  moderately severe erosive reflux esophagitits, o/w normal esophagus.,tiny antral erosions o/w normal stomach, inflammation on bx   ESOPHAGOGASTRODUODENOSCOPY (EGD) WITH  ESOPHAGEAL DILATION  12/09/2012   Dr. Jena Gauss- normal esophagus s/p maloney dilation, small hiatal hernia   POLYPECTOMY  05/30/2022   Procedure: POLYPECTOMY;  Surgeon: Corbin Ade, MD;  [facility];  Service: Endoscopy;;   SALPINGOOPHORECTOMY Right 06/03/2017   Procedure: SALPINGO OOPHORECTOMY;  Surgeon: Lazaro Arms, MD;  Location: [facility];  Service: Gynecology;  Laterality: Right;   SUPRACERVICAL ABDOMINAL HYSTERECTOMY N/A 06/03/2017   Procedure: HYSTERECTOMY SUPRACERVICAL ABDOMINAL;  Surgeon: Lazaro Arms, MD;  Location: [facility];  Service: Gynecology;  Laterality: N/A;   UNILATERAL SALPINGECTOMY Left 06/03/2017   Procedure: left salpingectomy;  Surgeon: Lazaro Arms, MD;  Location: [facility];  Service: Gynecology;  Laterality: Left;   Social History   Tobacco Use   Smoking status: Never    Passive exposure: Never   Smokeless tobacco: Never  Vaping Use   Vaping status: Never Used  Substance Use Topics   Alcohol use: No   Drug use: No   History reviewed. No pertinent family history. Allergies  Allergen Reactions   Tamiflu [Oseltamivir Phosphate] Other (See Comments)    Anxiety, insomnia, shaky, vomiting   Levaquin [Levofloxacin]     Whole body sensation of heat (no urticaria).   Sulfonamide Derivatives Hives   Review of Systems  Constitutional:  Negative for chills and fever.  HENT:  Negative for sore throat.   Respiratory:  Negative for cough and shortness of breath.   Cardiovascular:  Negative for  chest pain, palpitations and leg swelling.  Gastrointestinal:  Negative for abdominal pain, blood in stool, constipation, diarrhea, nausea and vomiting.  Genitourinary:  Positive for dysuria. Negative for hematuria.       Vaginal soreness  Musculoskeletal:  Negative for myalgias.  Skin:  Negative for itching and rash.  Neurological:  Positive for dizziness. Negative for headaches.  Psychiatric/Behavioral:  Negative for depression and suicidal ideas.      Objective:      BP 135/80 (BP Location: Left Arm, Patient Position: Sitting, Cuff Size: Normal)   Pulse 79   Ht 5\' 2"  (1.575 m)   Wt 179 lb (81.2 kg)   LMP 06/03/2017   SpO2 98%   BMI 32.74 kg/m  BP Readings from Last 3 Encounters:  11/27/23 135/80  08/27/23 132/87  05/30/22 111/69   Physical Exam Vitals reviewed.  Constitutional:      General: She is not in acute distress.    Appearance: Normal appearance. She is not toxic-appearing.  HENT:     Head: Normocephalic and atraumatic.     Right Ear: Tympanic membrane, ear canal and external ear normal.     Left Ear: Tympanic membrane, ear canal and external ear normal.     Nose: Nose normal. No congestion or rhinorrhea.     Mouth/Throat:     Mouth: Mucous membranes are moist.     Pharynx: Oropharynx is clear. No oropharyngeal exudate or posterior oropharyngeal erythema.  Eyes:     General: No scleral icterus.    Extraocular Movements: Extraocular movements intact.     Conjunctiva/sclera: Conjunctivae normal.     Pupils: Pupils are equal, round, and reactive to light.  Cardiovascular:     Rate and Rhythm: Normal rate and regular rhythm.     Pulses: Normal pulses.     Heart sounds: Normal heart sounds. No murmur heard.    No friction rub. No gallop.  Pulmonary:     Effort: Pulmonary effort is normal.     Breath sounds: Normal breath sounds. No wheezing, rhonchi or rales.  Abdominal:     General: Abdomen is flat. Bowel sounds are normal. There is no distension.     Palpations: Abdomen is soft.     Tenderness: There is no abdominal tenderness.  Musculoskeletal:        General: No swelling. Normal range of motion.     Cervical back: Normal range of motion.     Right lower leg: No edema.     Left lower leg: No edema.  Lymphadenopathy:     Cervical: No cervical adenopathy.  Skin:    General: Skin is warm and dry.     Capillary Refill: Capillary refill takes less than 2 seconds.     Coloration: Skin is not jaundiced.  Neurological:      General: No focal deficit present.     Mental Status: She is alert and oriented to person, place, and time.  Psychiatric:        Mood and Affect: Mood normal.        Behavior: Behavior normal.      Assessment & Plan:   Problem List Items Addressed This Visit       Allergic rhinitis   History of allergic rhinitis.  She currently manages symptoms with fluticasone nasal spray and cetirizine.  Today she endorses intermittent dizziness.  This has previously been related to inner ear and sinus infections.  Otic exam today is reassuring.  She denies sinus pain/pressure currently.  Prednisone  pack has previously been effective in alleviating her symptoms.  No additional treatment prescribed today.  She will continue current treatment measures for now.      Hyperlipidemia   She endorses a prior history of hyperlipidemia.  No previous labs available.  Repeat lipid panel ordered today.  She has started exercising regularly and is focused on dietary changes in an effort to lose weight.  She would like to avoid taking medications.  She expresses an interest in meeting with a dietitian to discuss appropriate dietary habits.  Through shared decision making, a referral to medical nutrition therapy was placed today.      Dysuria   Her acute concern today is vaginal soreness and mild dysuria.  No change in urinary frequency or quality of urine.  Denies fever/chills.  She attributes her symptoms to switching soaps but would like to rule out UTI.  POC UA obtained today is negative for UTI.      Return in about 3 months (around 02/25/2024).   Billie Lade, MD

## 2023-11-27 NOTE — Assessment & Plan Note (Signed)
History of allergic rhinitis.  She currently manages symptoms with fluticasone nasal spray and cetirizine.  Today she endorses intermittent dizziness.  This has previously been related to inner ear and sinus infections.  Otic exam today is reassuring.  She denies sinus pain/pressure currently.  Prednisone pack has previously been effective in alleviating her symptoms.  No additional treatment prescribed today.  She will continue current treatment measures for now.

## 2023-11-28 LAB — CBC WITH DIFFERENTIAL/PLATELET
Basophils Absolute: 0.1 10*3/uL (ref 0.0–0.2)
Basos: 1 %
EOS (ABSOLUTE): 0.2 10*3/uL (ref 0.0–0.4)
Eos: 2 %
Hematocrit: 40.3 % (ref 34.0–46.6)
Hemoglobin: 13.5 g/dL (ref 11.1–15.9)
Immature Grans (Abs): 0 10*3/uL (ref 0.0–0.1)
Immature Granulocytes: 0 %
Lymphocytes Absolute: 1.4 10*3/uL (ref 0.7–3.1)
Lymphs: 19 %
MCH: 29.6 pg (ref 26.6–33.0)
MCHC: 33.5 g/dL (ref 31.5–35.7)
MCV: 88 fL (ref 79–97)
Monocytes Absolute: 0.4 10*3/uL (ref 0.1–0.9)
Monocytes: 6 %
Neutrophils Absolute: 5.3 10*3/uL (ref 1.4–7.0)
Neutrophils: 72 %
Platelets: 419 10*3/uL (ref 150–450)
RBC: 4.56 x10E6/uL (ref 3.77–5.28)
RDW: 13.2 % (ref 11.7–15.4)
WBC: 7.4 10*3/uL (ref 3.4–10.8)

## 2023-11-28 LAB — LIPID PANEL
Chol/HDL Ratio: 3.7 {ratio} (ref 0.0–4.4)
Cholesterol, Total: 274 mg/dL — ABNORMAL HIGH (ref 100–199)
HDL: 75 mg/dL (ref >39)
LDL Chol Calc (NIH): 174 mg/dL — ABNORMAL HIGH (ref 0–99)
Triglycerides: 143 mg/dL (ref 0–149)
VLDL Cholesterol Cal: 25 mg/dL (ref 5–40)

## 2023-11-28 LAB — CMP14+EGFR
ALT: 15 [IU]/L (ref 0–32)
AST: 21 [IU]/L (ref 0–40)
Albumin: 4.5 g/dL (ref 3.9–4.9)
Alkaline Phosphatase: 120 [IU]/L (ref 44–121)
BUN/Creatinine Ratio: 9 (ref 9–23)
BUN: 7 mg/dL (ref 6–24)
Bilirubin Total: <0.2 mg/dL (ref 0.0–1.2)
CO2: 24 mmol/L (ref 20–29)
Calcium: 9.8 mg/dL (ref 8.7–10.2)
Chloride: 100 mmol/L (ref 96–106)
Creatinine, Ser: 0.77 mg/dL (ref 0.57–1.00)
Globulin, Total: 2.9 g/dL (ref 1.5–4.5)
Glucose: 85 mg/dL (ref 70–99)
Potassium: 4.2 mmol/L (ref 3.5–5.2)
Sodium: 139 mmol/L (ref 134–144)
Total Protein: 7.4 g/dL (ref 6.0–8.5)
eGFR: 96 mL/min/{1.73_m2} (ref >59)

## 2023-11-28 LAB — VITAMIN D 25 HYDROXY (VIT D DEFICIENCY, FRACTURES): Vit D, 25-Hydroxy: 22.1 ng/mL — ABNORMAL LOW (ref 30.0–100.0)

## 2023-11-28 LAB — TSH+FREE T4
Free T4: 0.72 ng/dL — ABNORMAL LOW (ref 0.82–1.77)
TSH: 2.01 u[IU]/mL (ref 0.450–4.500)

## 2023-11-28 LAB — HEMOGLOBIN A1C
Est. average glucose Bld gHb Est-mCnc: 120 mg/dL
Hgb A1c MFr Bld: 5.8 % — ABNORMAL HIGH (ref 4.8–5.6)

## 2023-11-28 LAB — HCV AB W REFLEX TO QUANT PCR: HCV Ab: NONREACTIVE

## 2023-11-28 LAB — B12 AND FOLATE PANEL
Folate: 18.6 ng/mL (ref >3.0)
Vitamin B-12: 875 pg/mL (ref 232–1245)

## 2023-11-28 LAB — HCV INTERPRETATION

## 2023-11-30 ENCOUNTER — Ambulatory Visit (HOSPITAL_COMMUNITY)
Admission: RE | Admit: 2023-11-30 | Discharge: 2023-11-30 | Disposition: A | Discharge status: Home / Self Care | Payer: 59 | Source: Ambulatory Visit | Attending: Internal Medicine | Admitting: Internal Medicine

## 2023-11-30 DIAGNOSIS — Z1231 Encounter for screening mammogram for malignant neoplasm of breast: Secondary | ICD-10-CM | POA: Diagnosis present

## 2023-11-30 LAB — URINALYSIS
Bilirubin, UA: NEGATIVE
Glucose, UA: NEGATIVE
Ketones, UA: NEGATIVE
Leukocytes,UA: NEGATIVE
Nitrite, UA: NEGATIVE
Protein,UA: NEGATIVE
RBC, UA: NEGATIVE
Specific Gravity, UA: 1.01 (ref 1.005–1.030)
Urobilinogen, Ur: 0.2 mg/dL (ref 0.2–1.0)
pH, UA: 6.5 (ref 5.0–7.5)

## 2023-12-03 ENCOUNTER — Ambulatory Visit (HOSPITAL_COMMUNITY): Payer: 59

## 2023-12-15 ENCOUNTER — Encounter: Payer: Self-pay | Admitting: Obstetrics & Gynecology

## 2023-12-15 ENCOUNTER — Encounter: Payer: 59 | Admitting: Obstetrics & Gynecology

## 2023-12-16 NOTE — Progress Notes (Signed)
 This encounter was created in error - please disregard.

## 2024-01-04 ENCOUNTER — Encounter: Payer: 59 | Attending: Internal Medicine | Admitting: Nutrition

## 2024-01-04 VITALS — Ht 62.0 in | Wt 181.0 lb

## 2024-01-04 DIAGNOSIS — E559 Vitamin D deficiency, unspecified: Secondary | ICD-10-CM | POA: Insufficient documentation

## 2024-01-04 DIAGNOSIS — R7303 Prediabetes: Secondary | ICD-10-CM | POA: Diagnosis present

## 2024-01-04 DIAGNOSIS — E782 Mixed hyperlipidemia: Secondary | ICD-10-CM | POA: Diagnosis present

## 2024-01-04 DIAGNOSIS — E669 Obesity, unspecified: Secondary | ICD-10-CM | POA: Insufficient documentation

## 2024-01-04 NOTE — Patient Instructions (Signed)
 Goals  Increase fiber to 25 grams per day using whole plant based foods  Do cardio for 60 minutes 3 days per week Do some strength training for 30-60 minutes twice a week Eat 2 vegetables with lunch and dinner daily.

## 2024-01-04 NOTE — Progress Notes (Signed)
 Medical Nutrition Therapy  Appointment Start time:  0800  Appointment End time:  0900  Primary concerns today: Obesity, Hyperlipidemia and Low Vit D  Referral diagnosis: E66.9, E78, ,  Preferred learning style: NO Preference  Learning readiness: Ready  NUTRITION ASSESSMENT  48 yr old bfemale referred for Obesity, Pre diabetes, Hyperlipidemia and low vit d levels. A1C 5.8%. Wants to lose weight, get her A1C down and her cholesterol levels.  Clinical Medical Hx:  Past Medical History:  Diagnosis Date   Anemia    GERD (gastroesophageal reflux disease)    Helicobacter pylori (H. pylori)    prevpac   Hemorrhoids     Medications:  Current Outpatient Medications on File Prior to Visit  Medication Sig Dispense Refill   cetirizine (ZYRTEC) 10 MG tablet Take 10 mg by mouth daily.     fluticasone (FLONASE) 50 MCG/ACT nasal spray Place 1 spray into both nostrils daily as needed for allergies or rhinitis.     Multiple Vitamins-Minerals (MULTIVITAMIN ADULT) CHEW Chew 2 each by mouth daily.     No current facility-administered medications on file prior to visit.     Labs:  Lab Results  Component Value Date   HGBA1C 5.8 (H) 11/27/2023      Latest Ref Rng & Units 11/27/2023   10:35 AM 05/03/2020    4:15 AM 05/03/2020    1:19 AM  CMP  Glucose 70 - 99 mg/dL 85  161  096   BUN 6 - 24 mg/dL 7  6  6    Creatinine 0.57 - 1.00 mg/dL 0.45  4.09  8.11   Sodium 134 - 144 mmol/L 139  137  141   Potassium 3.5 - 5.2 mmol/L 4.2  3.8  3.7   Chloride 96 - 106 mmol/L 100  107  103   CO2 20 - 29 mmol/L 24  21    Calcium 8.7 - 10.2 mg/dL 9.8  8.6    Total Protein 6.0 - 8.5 g/dL 7.4  7.2    Total Bilirubin 0.0 - 1.2 mg/dL <9.1  0.3    Alkaline Phos 44 - 121 IU/L 120  92    AST 0 - 40 IU/L 21  15    ALT 0 - 32 IU/L 15  13     Lipid Panel     Component Value Date/Time   CHOL 274 (H) 11/27/2023 1035   TRIG 143 11/27/2023 1035   HDL 75 11/27/2023 1035   CHOLHDL 3.7 11/27/2023 1035   LDLCALC 174  (H) 11/27/2023 1035   LABVLDL 25 11/27/2023 1035    Notable Signs/Symptoms: Tired  Lifestyle & Dietary Hx LIves by herself. Eats out often for convenience  Estimated daily fluid intake: 120  oz Supplements: VIt D Sleep: 8 Stress / self-care: None Current average weekly physical activity: Walks 5 times per week- 1 mile; And walks 2 miles at home after school   24-Hr Dietary Recall- packs her lunch and meals. First Meal: 1 packet oatmeal and slices apples Snack:  Second Meal: PB and J sandwich crustable, bag of dortits and 2 chocolate chip cookies, water Snack: yogurt activi, apples and fig bar Third Meal: Vegetable beef soup campbells, regular,  Or salad with fish stick or birds eye vegetables,  Snack: sometimes activi yogurt Beverages: water  Estimated Energy Needs Calories: 1200 Carbohydrate: 135g Protein: 90g Fat: 33g   NUTRITION DIAGNOSIS  NB-1.1 Food and nutrition-related knowledge deficit As related to High calorie high carb diet.  As evidenced by A1C  5.8%, BMI > 30 and Hyperlipidemia.   NUTRITION INTERVENTION  Nutrition education (E-1) on the following topics:  Nutrition and  Pre Diabetes education provided on My Plate, CHO counting, meal planning, portion sizes, timing of meals, avoiding snacks between meals unless having a low blood sugar, target ranges for A1C and blood sugars, signs/symptoms and treatment of hyper/hypoglycemia, monitoring blood sugars, taking medications as prescribed, benefits of exercising 30 minutes per day and prevention of complications of DM. Lifestyle Medicine  - Whole Food, Plant Predominant Nutrition is highly recommended: Eat Plenty of vegetables, Mushrooms, fruits, Legumes, Whole Grains, Nuts, seeds in lieu of processed meats, processed snacks/pastries red meat, poultry, eggs.    -It is better to avoid simple carbohydrates including: Cakes, Sweet Desserts, Ice Cream, Soda (diet and regular), Sweet Tea, Candies, Chips, Cookies, Store  Bought Juices, Alcohol in Excess of  1-2 drinks a day, Lemonade,  Artificial Sweeteners, Doughnuts, Coffee Creamers, "Sugar-free" Products, etc, etc.  This is not a complete list.....  Exercise: If you are able: 30 -60 minutes a day ,4 days a week, or 150 minutes a week.  The longer the better.  Combine stretch, strength, and aerobic activities.  If you were told in the past that you have high risk for cardiovascular diseases, you may seek evaluation by your heart doctor prior to initiating moderate to intense exercise programs.   Handouts Provided Include  Lifestyle Medicine handouts Pre Dm handouts   Learning Style & Readiness for Change Teaching method utilized: Visual & Auditory  Demonstrated degree of understanding via: Teach Back  Barriers to learning/adherence to lifestyle change: None  Goals Established by Pt Goals  Increase fiber to 25 grams per day using whole plant based foods  Do cardio for 60 minutes 3 days per week Do some strength training for 30-60 minutes twice a week Eat 2 vegetables with lunch and dinner daily.  MONITORING & EVALUATION Dietary intake, weekly physical activity, and weight in 1 month.  Next Steps  Patient is to work on meal planning and meal prepping with cooking at home more.Marland Kitchen

## 2024-01-12 ENCOUNTER — Encounter: Payer: Self-pay | Admitting: Nutrition

## 2024-02-01 ENCOUNTER — Ambulatory Visit: Payer: 59 | Admitting: Nutrition

## 2024-02-22 ENCOUNTER — Other Ambulatory Visit (HOSPITAL_COMMUNITY)
Admission: RE | Admit: 2024-02-22 | Discharge: 2024-02-22 | Disposition: A | Discharge status: Home / Self Care | Source: Ambulatory Visit | Attending: Obstetrics & Gynecology | Admitting: Obstetrics & Gynecology

## 2024-02-22 ENCOUNTER — Encounter: Payer: Self-pay | Admitting: Obstetrics & Gynecology

## 2024-02-22 ENCOUNTER — Ambulatory Visit: Payer: 59 | Admitting: Obstetrics & Gynecology

## 2024-02-22 VITALS — BP 125/84 | HR 74 | Ht 62.0 in | Wt 180.0 lb

## 2024-02-22 DIAGNOSIS — Z01419 Encounter for gynecological examination (general) (routine) without abnormal findings: Secondary | ICD-10-CM

## 2024-02-22 NOTE — Progress Notes (Signed)
 Subjective:     [name] is a 48 y.o. female here for a routine exam.  Patient's last menstrual period was 06/03/2017. [id number] Birth Control Method:  hysterectomy Menstrual Calendar(currently): amenorrhea  Current complaints: none.   Current acute medical issues:  none   Recent Gynecologic History Patient's last menstrual period was 06/03/2017. Last Pap: unsure,   Last mammogram: 11/30/23,  normal  Past Medical History:  Diagnosis Date   Anemia    GERD (gastroesophageal reflux disease)    Helicobacter pylori (H. pylori)    prevpac   Hemorrhoids     Past Surgical History:  Procedure Laterality Date   ABDOMINAL HYSTERECTOMY     APPENDECTOMY     48 years old   BIOPSY  05/30/2022   Procedure: BIOPSY;  Surgeon: Suzette Espy, MD;  [facility]: [facility];  Service: Endoscopy;;   COLONOSCOPY WITH PROPOFOL N/A 05/30/2022   Procedure: COLONOSCOPY WITH PROPOFOL;  Surgeon: Suzette Espy, MD;  Location: [facility];  Service: Endoscopy;  Laterality: N/A;  2:00pm   ESOPHAGOGASTRODUODENOSCOPY  09/14/2007   Dr. Ardean Kohl quadrant distal esophageal erosion consistant with  moderately severe erosive reflux esophagitits, o/w normal esophagus.,tiny antral erosions o/w normal stomach, inflammation on bx   ESOPHAGOGASTRODUODENOSCOPY (EGD) WITH ESOPHAGEAL DILATION  12/09/2012   Dr. Riley Cheadle- normal esophagus s/p maloney dilation, small hiatal hernia   POLYPECTOMY  05/30/2022   Procedure: POLYPECTOMY;  Surgeon: Suzette Espy, MD;  [facility]: [facility];  Service: Endoscopy;;   SALPINGOOPHORECTOMY Right 06/03/2017   Procedure: SALPINGO OOPHORECTOMY;  Surgeon: Wendelyn Halter, MD;  Location: [facility];  Service: Gynecology;  Laterality: Right;   SUPRACERVICAL ABDOMINAL HYSTERECTOMY N/A 06/03/2017   Procedure: HYSTERECTOMY SUPRACERVICAL ABDOMINAL;  Surgeon: Wendelyn Halter, MD;  Location: [facility];  Service: Gynecology;  Laterality: N/A;   UNILATERAL SALPINGECTOMY Left 06/03/2017   Procedure: left  salpingectomy;  Surgeon: Wendelyn Halter, MD;  Location: [facility];  Service: Gynecology;  Laterality: Left;    OB History     Gravida  0   Para  0   Term  0   Preterm  0   AB  0   Living  0      SAB  0   IAB  0   Ectopic  0   Multiple  0   Live Births  0           Social History   Socioeconomic History   Marital status: Single    Spouse name: Not on file   Number of children: Not on file   Years of education: Not on file   Highest education level: Not on file  Occupational History   Not on file  Tobacco Use   Smoking status: Never    Passive exposure: Never   Smokeless tobacco: Never  Vaping Use   Vaping status: Never Used  Substance and Sexual Activity   Alcohol use: No   Drug use: No   Sexual activity: Yes    Birth control/protection: Surgical    Comment: hyst  Other Topics Concern   Not on file  Social History Narrative   Not on file   Social Drivers of Health   Financial Resource Strain: Not on file  Food Insecurity: No Food Insecurity (12/15/2023)   Hunger Vital Sign    Worried About Running Out of Food in the Last Year: Never true    Ran Out of Food in the Last Year: Never true  Transportation Needs:  No Transportation Needs (12/15/2023)   PRAPARE - Administrator, Civil Service (Medical): No    Lack of Transportation (Non-Medical): No  Physical Activity: Sufficiently Active (12/15/2023)   Exercise Vital Sign    Days of Exercise per Week: 6 days    Minutes of Exercise per Session: 60 min  Stress: No Stress Concern Present (12/15/2023)   [employer] of Occupational Health - Occupational Stress Questionnaire    Feeling of Stress : Not at all  Social Connections: Moderately Integrated (12/15/2023)   Social Connection and Isolation Panel [NHANES]    Frequency of Communication with Friends and Family: More than three times a week    Frequency of Social Gatherings with Friends and Family: More than three times a week    Attends  Religious Services: More than 4 times per year    Active Member of [employer] or Organizations: Yes    Attends Engineer, structural: More than 4 times per year    Marital Status: Never married    History reviewed. No pertinent family history.   Current Outpatient Medications:    cetirizine (ZYRTEC) 10 MG tablet, Take 10 mg by mouth daily., Disp: , Rfl:    cholecalciferol (VITAMIN D3) 25 MCG (1000 UNIT) tablet, Take 1,000 Units by mouth daily., Disp: , Rfl:    fluticasone (FLONASE) 50 MCG/ACT nasal spray, Place 1 spray into both nostrils daily as needed for allergies or rhinitis., Disp: , Rfl:    Multiple Vitamins-Minerals (MULTIVITAMIN ADULT) CHEW, Chew 2 each by mouth daily., Disp: , Rfl:   Review of Systems  Review of Systems  Constitutional: Negative for fever, chills, weight loss, malaise/fatigue and diaphoresis.  HENT: Negative for hearing loss, ear pain, nosebleeds, congestion, sore throat, neck pain, tinnitus and ear discharge.   Eyes: Negative for blurred vision, double vision, photophobia, pain, discharge and redness.  Respiratory: Negative for cough, hemoptysis, sputum production, shortness of breath, wheezing and stridor.   Cardiovascular: Negative for chest pain, palpitations, orthopnea, claudication, leg swelling and PND.  Gastrointestinal: negative for abdominal pain. Negative for heartburn, nausea, vomiting, diarrhea, constipation, blood in stool and melena.  Genitourinary: Negative for dysuria, urgency, frequency, hematuria and flank pain.  Musculoskeletal: Negative for myalgias, back pain, joint pain and falls.  Skin: Negative for itching and rash.  Neurological: Negative for dizziness, tingling, tremors, sensory change, speech change, focal weakness, seizures, loss of consciousness, weakness and headaches.  Endo/Heme/Allergies: Negative for environmental allergies and polydipsia. Does not bruise/bleed easily.  Psychiatric/Behavioral: Negative for depression,  suicidal ideas, hallucinations, memory loss and substance abuse. The patient is not nervous/anxious and does not have insomnia.        Objective:  Blood pressure 125/84, pulse 74, height 5\' 2"  (1.575 m), weight 180 lb (81.6 kg), last menstrual period 06/03/2017.   Physical Exam  Vitals reviewed. Constitutional: She is oriented to person, place, and time. She appears well-developed and well-nourished.  HENT:  Head: Normocephalic and atraumatic.        Right Ear: External ear normal.  Left Ear: External ear normal.  Nose: Nose normal.  Mouth/Throat: Oropharynx is clear and moist.  Eyes: Conjunctivae and EOM are normal. Pupils are equal, round, and reactive to light. Right eye exhibits no discharge. Left eye exhibits no discharge. No scleral icterus.  Neck: Normal range of motion. Neck supple. No tracheal deviation present. No thyromegaly present.  Cardiovascular: Normal rate, regular rhythm, normal heart sounds and intact distal pulses.  Exam reveals no gallop and no friction  rub.   No murmur heard. Respiratory: Effort normal and breath sounds normal. No respiratory distress. She has no wheezes. She has no rales. She exhibits no tenderness.  GI: Soft. Bowel sounds are normal. She exhibits no distension and no mass. There is no tenderness. There is no rebound and no guarding.  Genitourinary:  Breasts no masses skin changes or nipple changes bilaterally      Vulva is normal without lesions Vagina is pink moist without discharge Cervix normal in appearance and pap is done Uterus is absent Adnexa is negative   Musculoskeletal: Normal range of motion. She exhibits no edema and no tenderness.  Neurological: She is alert and oriented to person, place, and time. She has normal reflexes. She displays normal reflexes. No cranial nerve deficit. She exhibits normal muscle tone. Coordination normal.  Skin: Skin is warm and dry. No rash noted. No erythema. No pallor.  Psychiatric: She has a normal  mood and affect. Her behavior is normal. Judgment and thought content normal.       Medications Ordered at today's visit: No orders of the defined types were placed in this encounter.   Other orders placed at today's visit: No orders of the defined types were placed in this encounter.    ASSESSMENT + PLAN:    ICD-10-CM   1. Well woman exam with routine gynecological exam  Z01.419           Return in about 3 years (around 02/22/2027) for yearly.

## 2024-02-22 NOTE — Addendum Note (Signed)
 Addended by: Kerrie Peek on: 02/22/2024 11:57 AM   Modules accepted: Orders

## 2024-02-25 LAB — CYTOLOGY - PAP
Adequacy: ABSENT
Comment: NEGATIVE
Diagnosis: NEGATIVE
High risk HPV: NEGATIVE

## 2024-02-26 ENCOUNTER — Ambulatory Visit: Payer: 59 | Admitting: Internal Medicine

## 2024-02-26 ENCOUNTER — Encounter: Payer: Self-pay | Admitting: Internal Medicine

## 2024-02-26 VITALS — BP 129/84 | HR 82 | Ht 62.0 in | Wt 178.4 lb

## 2024-02-26 DIAGNOSIS — E559 Vitamin D deficiency, unspecified: Secondary | ICD-10-CM | POA: Diagnosis not present

## 2024-02-26 DIAGNOSIS — R7989 Other specified abnormal findings of blood chemistry: Secondary | ICD-10-CM | POA: Diagnosis not present

## 2024-02-26 DIAGNOSIS — E785 Hyperlipidemia, unspecified: Secondary | ICD-10-CM

## 2024-02-26 DIAGNOSIS — R7303 Prediabetes: Secondary | ICD-10-CM | POA: Insufficient documentation

## 2024-02-26 DIAGNOSIS — J3089 Other allergic rhinitis: Secondary | ICD-10-CM

## 2024-02-26 MED ORDER — TRIAMCINOLONE ACETONIDE 0.1 % EX CREA: 1.0000 | TOPICAL_CREAM | Freq: Two times a day (BID) | CUTANEOUS | 1 refills | Status: AC | PRN

## 2024-02-26 NOTE — Assessment & Plan Note (Signed)
 Free T4 0.77 with normal TSH on labs from January.  She remains asymptomatic.  Repeat TSH/T4 ordered today.

## 2024-02-26 NOTE — Progress Notes (Signed)
 Established Patient Office Visit  Subjective   Patient ID: [name], female    DOB: 04/18/1976  Age: 48 y.o. MRN: [id number]  Chief Complaint  Patient presents with   Care Management    Three month follow up   [name] returns to care today for routine follow-up.  She was last evaluated by me on 1/17 she was referred to medical nutrition therapy in the setting of hyperlipidemia.  No medication changes were made and 3-month follow-up was arranged.  In the interim, she has been seen by gynecology.  There have otherwise been no acute interval events.  Today she reports feeling well.  She has continued to focus on dietary changes and increased her exercise regimen in effort to lose weight and improve control of hyperlipidemia.  She does not have any acute concerns to discuss.  Past Medical History:  Diagnosis Date   Anemia    GERD (gastroesophageal reflux disease)    Helicobacter pylori (H. pylori)    prevpac   Hemorrhoids    Past Surgical History:  Procedure Laterality Date   ABDOMINAL HYSTERECTOMY     APPENDECTOMY     48 years old   BIOPSY  05/30/2022   Procedure: BIOPSY;  Surgeon: Suzette Espy, MD;  [facility];  Service: Endoscopy;;   COLONOSCOPY WITH PROPOFOL  N/A 05/30/2022   Procedure: COLONOSCOPY WITH PROPOFOL ;  Surgeon: Suzette Espy, MD;  [facility]: [facility];  Service: Endoscopy;  Laterality: N/A;  2:00pm   ESOPHAGOGASTRODUODENOSCOPY  09/14/2007   Dr. Ardean Kohl quadrant distal esophageal erosion consistant with  moderately severe erosive reflux esophagitits, o/w normal esophagus.,tiny antral erosions o/w normal stomach, inflammation on bx   ESOPHAGOGASTRODUODENOSCOPY (EGD) WITH ESOPHAGEAL DILATION  12/09/2012   Dr. Riley Cheadle- normal esophagus s/p maloney dilation, small hiatal hernia   POLYPECTOMY  05/30/2022   Procedure: POLYPECTOMY;  Surgeon: Suzette Espy, MD;  [facility];  Service: Endoscopy;;   SALPINGOOPHORECTOMY Right  06/03/2017   Procedure: SALPINGO OOPHORECTOMY;  Surgeon: Wendelyn Halter, MD;  Location: [facility];  Service: Gynecology;  Laterality: Right;   SUPRACERVICAL ABDOMINAL HYSTERECTOMY N/A 06/03/2017   Procedure: HYSTERECTOMY SUPRACERVICAL ABDOMINAL;  Surgeon: Wendelyn Halter, MD;  Location: [facility];  Service: Gynecology;  Laterality: N/A;   UNILATERAL SALPINGECTOMY Left 06/03/2017   Procedure: left salpingectomy;  Surgeon: Wendelyn Halter, MD;  Location: [facility];  Service: Gynecology;  Laterality: Left;   Social History   Tobacco Use   Smoking status: Never    Passive exposure: Never   Smokeless tobacco: Never  Vaping Use   Vaping status: Never Used  Substance Use Topics   Alcohol use: No   Drug use: No   No family history on file. Allergies  Allergen Reactions   Tamiflu  [Oseltamivir  Phosphate] Other (See Comments)    Anxiety, insomnia, shaky, vomiting   Levaquin [Levofloxacin]     Whole body sensation of heat (no urticaria).   Sulfonamide Derivatives Hives   Review of Systems  Constitutional:  Negative for chills and fever.  HENT:  Negative for sore throat.   Respiratory:  Negative for cough and shortness of breath.   Cardiovascular:  Negative for chest pain, palpitations and leg swelling.  Gastrointestinal:  Negative for abdominal pain, blood in stool, constipation, diarrhea, nausea and vomiting.  Genitourinary:  Negative for dysuria and hematuria.  Musculoskeletal:  Negative for myalgias.  Skin:  Negative for itching and rash.  Neurological:  Negative for dizziness and headaches.  Psychiatric/Behavioral:  Negative for depression and suicidal ideas.      Objective:     BP 129/84   Pulse 82   Ht 5\' 2"  (1.575 m)   Wt 178 lb 6.4 oz (80.9 kg)   LMP 06/03/2017   SpO2 97%   BMI 32.63 kg/m  BP Readings from Last 3 Encounters:  02/26/24 129/84  02/22/24 125/84  12/15/23 138/80   Physical Exam Vitals reviewed.  Constitutional:      General: She is not in acute distress.     Appearance: Normal appearance. She is obese. She is not toxic-appearing.  HENT:     Head: Normocephalic and atraumatic.     Right Ear: External ear normal.     Left Ear: External ear normal.     Nose: Nose normal. No congestion or rhinorrhea.     Mouth/Throat:     Mouth: Mucous membranes are moist.     Pharynx: Oropharynx is clear. No oropharyngeal exudate or posterior oropharyngeal erythema.  Eyes:     General: No scleral icterus.    Extraocular Movements: Extraocular movements intact.     Conjunctiva/sclera: Conjunctivae normal.     Pupils: Pupils are equal, round, and reactive to light.  Cardiovascular:     Rate and Rhythm: Normal rate and regular rhythm.     Pulses: Normal pulses.     Heart sounds: Normal heart sounds. No murmur heard.    No friction rub. No gallop.  Pulmonary:     Effort: Pulmonary effort is normal.     Breath sounds: Normal breath sounds. No wheezing, rhonchi or rales.  Abdominal:     General: Abdomen is flat. Bowel sounds are normal. There is no distension.     Palpations: Abdomen is soft.     Tenderness: There is no abdominal tenderness.  Musculoskeletal:        General: No swelling. Normal range of motion.     Cervical back: Normal range of motion.     Right lower leg: No edema.     Left lower leg: No edema.  Lymphadenopathy:     Cervical: No cervical adenopathy.  Skin:    General: Skin is warm and dry.     Capillary Refill: Capillary refill takes less than 2 seconds.     Coloration: Skin is not jaundiced.  Neurological:     General: No focal deficit present.     Mental Status: She is alert and oriented to person, place, and time.  Psychiatric:        Mood and Affect: Mood normal.        Behavior: Behavior normal.   Last CBC Lab Results  Component Value Date   WBC 7.4 11/27/2023   HGB 13.5 11/27/2023   HCT 40.3 11/27/2023   MCV 88 11/27/2023   MCH 29.6 11/27/2023   RDW 13.2 11/27/2023   PLT 419 11/27/2023   Last metabolic panel Lab  Results  Component Value Date   GLUCOSE 85 11/27/2023   NA 139 11/27/2023   K 4.2 11/27/2023   CL 100 11/27/2023   CO2 24 11/27/2023   BUN 7 11/27/2023   CREATININE 0.77 11/27/2023   EGFR 96 11/27/2023   CALCIUM 9.8 11/27/2023   PROT 7.4 11/27/2023   ALBUMIN 4.5 11/27/2023   LABGLOB 2.9 11/27/2023   BILITOT <0.2 11/27/2023   ALKPHOS 120 11/27/2023   AST 21 11/27/2023   ALT 15 11/27/2023   ANIONGAP 9 05/03/2020   Last lipids Lab Results  Component Value Date  CHOL 274 (H) 11/27/2023   HDL 75 11/27/2023   LDLCALC 174 (H) 11/27/2023   TRIG 143 11/27/2023   CHOLHDL 3.7 11/27/2023   Last hemoglobin A1c Lab Results  Component Value Date   HGBA1C 5.8 (H) 11/27/2023   Last thyroid functions Lab Results  Component Value Date   TSH 2.010 11/27/2023   Last vitamin D  Lab Results  Component Value Date   VD25OH 22.1 (L) 11/27/2023   Last vitamin B12 and Folate Lab Results  Component Value Date   VITAMINB12 875 11/27/2023   FOLATE 18.6 11/27/2023   The 10-year ASCVD risk score (Arnett DK, et al., 2019) is: 1.1%    Assessment & Plan:   Problem List Items Addressed This Visit       Allergic rhinitis   Symptoms remain well-controlled with routine use of fluticasone nasal spray and cetirizine.      Hyperlipidemia   Lipid panel updated in January.  Total cholesterol 274 and LDL 174.  Her 10-year ASCVD risk is 1.1%.  As previously documented, she wants to avoid taking prescription medications and has made significant efforts to change her diet and exercise regimen to improve control of hyperlipidemia.  She will continue to focus on these efforts.  Repeat labs at follow-up in 6 months.      Vitamin D  insufficiency - Primary   Noted on previous labs.  She has started daily vitamin D  supplementation.  Repeat vitamin D  level ordered today.      Prediabetes   A1c 5.8 on labs from January.  She has made significant changes to her her diet and exercise regimen.  Repeat A1c  at follow-up in 6 months      Low serum T4 level   Free T4 0.77 with normal TSH on labs from January.  She remains asymptomatic.  Repeat TSH/T4 ordered today.      Return in about 6 months (around 08/27/2024) for CPE.   Tobi Fortes, MD

## 2024-02-26 NOTE — Assessment & Plan Note (Signed)
 Noted on previous labs.  She has started daily vitamin D supplementation.  Repeat vitamin D level ordered today.

## 2024-02-26 NOTE — Assessment & Plan Note (Signed)
 A1c 5.8 on labs from January.  She has made significant changes to her her diet and exercise regimen.  Repeat A1c at follow-up in 6 months

## 2024-02-26 NOTE — Patient Instructions (Signed)
 It was a pleasure to see you today.  Thank you for giving us  the opportunity to be involved in your care.  Below is a brief recap of your visit and next steps.  We will plan to see you again in 6 months.  Summary No medication changes today Keep up the great work with diet and exercise Follow up in 6 months

## 2024-02-26 NOTE — Assessment & Plan Note (Signed)
 Lipid panel updated in January.  Total cholesterol 274 and LDL 174.  Her 10-year ASCVD risk is 1.1%.  As previously documented, she wants to avoid taking prescription medications and has made significant efforts to change her diet and exercise regimen to improve control of hyperlipidemia.  She will continue to focus on these efforts.  Repeat labs at follow-up in 6 months.

## 2024-02-26 NOTE — Assessment & Plan Note (Signed)
 Symptoms remain well-controlled with routine use of fluticasone nasal spray and cetirizine.

## 2024-02-27 LAB — TSH+FREE T4
Free T4: 0.87 ng/dL (ref 0.82–1.77)
TSH: 2.17 u[IU]/mL (ref 0.450–4.500)

## 2024-02-27 LAB — VITAMIN D 25 HYDROXY (VIT D DEFICIENCY, FRACTURES): Vit D, 25-Hydroxy: 33.9 ng/mL (ref 30.0–100.0)

## 2024-04-09 ENCOUNTER — Encounter (HOSPITAL_COMMUNITY): Payer: Self-pay

## 2024-04-09 ENCOUNTER — Emergency Department (HOSPITAL_COMMUNITY)
Admission: EM | Admit: 2024-04-09 | Discharge: 2024-04-09 | Disposition: A | Discharge status: Home / Self Care | Attending: Emergency Medicine | Admitting: Emergency Medicine

## 2024-04-09 ENCOUNTER — Other Ambulatory Visit: Payer: Self-pay

## 2024-04-09 DIAGNOSIS — Y92410 Unspecified street and highway as the place of occurrence of the external cause: Secondary | ICD-10-CM | POA: Insufficient documentation

## 2024-04-09 DIAGNOSIS — Z041 Encounter for examination and observation following transport accident: Secondary | ICD-10-CM | POA: Insufficient documentation
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
## 2024-04-09 NOTE — Discharge Instructions (Signed)
 Please follow-up closely with your primary care doctor on an outpatient basis.  You may take Tylenol  and Motrin  as needed for any pain.  Return to emergency department immediately for any new or worsening symptoms.
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
## 2024-04-27 ENCOUNTER — Other Ambulatory Visit: Payer: Self-pay

## 2024-04-27 ENCOUNTER — Telehealth: Payer: Self-pay | Admitting: Internal Medicine

## 2024-04-27 ENCOUNTER — Encounter: Payer: Self-pay | Admitting: Internal Medicine

## 2024-04-27 ENCOUNTER — Ambulatory Visit: Payer: Self-pay

## 2024-04-27 ENCOUNTER — Other Ambulatory Visit: Payer: Self-pay | Admitting: Internal Medicine

## 2024-04-27 DIAGNOSIS — N3001 Acute cystitis with hematuria: Secondary | ICD-10-CM

## 2024-04-27 DIAGNOSIS — R300 Dysuria: Secondary | ICD-10-CM

## 2024-04-27 MED ORDER — NITROFURANTOIN MONOHYD MACRO 100 MG PO CAPS: 100.0000 mg | ORAL_CAPSULE | Freq: Two times a day (BID) | ORAL | 0 refills | Status: AC

## 2024-04-27 NOTE — Telephone Encounter (Signed)
 Spoke with patient she will come in today to leave urine sample per dixon

## 2024-04-27 NOTE — Telephone Encounter (Signed)
 FYI Only or Action Required?: Action required by provider  Patient was last seen in primary care on 02/26/2024 by Tobi Fortes, MD. Called Nurse Triage reporting Urinary Frequency. Symptoms began yesterday. Interventions attempted: Nothing. Symptoms are: rapidly worsening.  Triage Disposition: See Physician Within 24 Hours  Patient/caregiver understands and will follow disposition?: Yes  Copied from CRM [id number]. Topic: Clinical - Red Word Triage >> Apr 27, 2024  1:07 PM Tiffini S wrote: [facility] that prompted transfer to Nurse Triage: Patient have call asking to talk with nurse. Have been been waiting to heard from a nurse about possible UTI. Reason for Disposition  Side (flank) or lower back pain present    Pain post urination  Answer Assessment - Initial Assessment Questions 1. SYMPTOM: What's the main symptom you're concerned about? (e.g., frequency, incontinence)     Pt states has had urinary freq, no burning, but after urinating has discomfort in lower abdomen, little bit of blood upon wiping on toilet paper but not it toilet bowl.  2. ONSET: When did the  yest  start?     yesterday 3. PAIN: Is there any pain? If Yes, ask: How bad is it? (Scale: 1-10; mild, moderate, severe)     Post urinating pain 6/10 in lower abdomen.  4. CAUSE: What do you think is causing the symptoms?     Pt thinks it maybe UTI 5. OTHER SYMPTOMS: Do you have any other symptoms? (e.g., blood in urine, fever, flank pain, pain with urination)     Blood upon wiping on toilet paper, freq, lower abd pain after voiding.  6. PREGNANCY: Is there any chance you are pregnant? When was your last menstrual period?     No, post hysterectomy.   Has had a UTI before and was a lot more pain, this pain/discomfort is not as intense, but this discomfort is after urinating feels different. No back pain.  No appmt's in main office, pt advised UC and then mentions she is close to one and is going to go there now.    Pt also is requesting to please hear back from the office to establish care with a new PCP because MD Dixon has left that office.  Protocols used: Urinary Symptoms-A-AH

## 2024-04-27 NOTE — Telephone Encounter (Signed)
 Copied from CRM [id number]. Topic: Clinical - Pink Word Triage >> Apr 27, 2024 11:13 AM [name] wrote: Reason for Triage: Pt called requesting to be seen today for [name]. No appt available, please advise    ----------------------------------------------------------------------- From previous Reason for Contact - Scheduling: Patient/patient representative is calling to schedule an appointment. Refer to attachments for appointment information.

## 2024-04-27 NOTE — Telephone Encounter (Signed)
 She is already a patient of our office and has already discussed to follow up with Rice Chamorro at her previous appt.

## 2024-04-27 NOTE — Progress Notes (Signed)
 Ms. [name] contacted [facility] health triage today with concern for UTI.  She endorsed a 1 day history of dysuria, lower abdominal pain, and hematuria.  She was ultimately triaged to [facility] vs urgent care and came by earlier this afternoon to provide a urine specimen for urinalysis.  UA showed 1+ LE and was positive for blood.  Based on her symptoms and UA results, I have prescribed Macrobid x 5 days for empiric UTI treatment.  Will follow urine culture.  She will return to care if symptoms worsen or fail to improve.  Otherwise, she is scheduled for routine follow-up at [facility] on 08/29/24.

## 2024-04-27 NOTE — Telephone Encounter (Signed)
 Pt has been triaged - please see triage note  04/27/24  1:10 PM

## 2024-04-28 LAB — URINALYSIS
Bilirubin, UA: NEGATIVE
Glucose, UA: NEGATIVE
Ketones, UA: NEGATIVE
Nitrite, UA: NEGATIVE
Specific Gravity, UA: 1.025 (ref 1.005–1.030)
Urobilinogen, Ur: 0.2 mg/dL (ref 0.2–1.0)
pH, UA: 5.5 (ref 5.0–7.5)

## 2024-05-01 LAB — URINE CULTURE

## 2024-05-12 IMAGING — DX DG SHOULDER 2+V*L*
3 series · 3 of 3 positions shown · non-contrast
Comparison: None Available.

CLINICAL DATA: left elbow and shoulder injury x 2 days

EXAM:
LEFT SHOULDER - 2+ VIEW

[shoulder internal rotation ap]
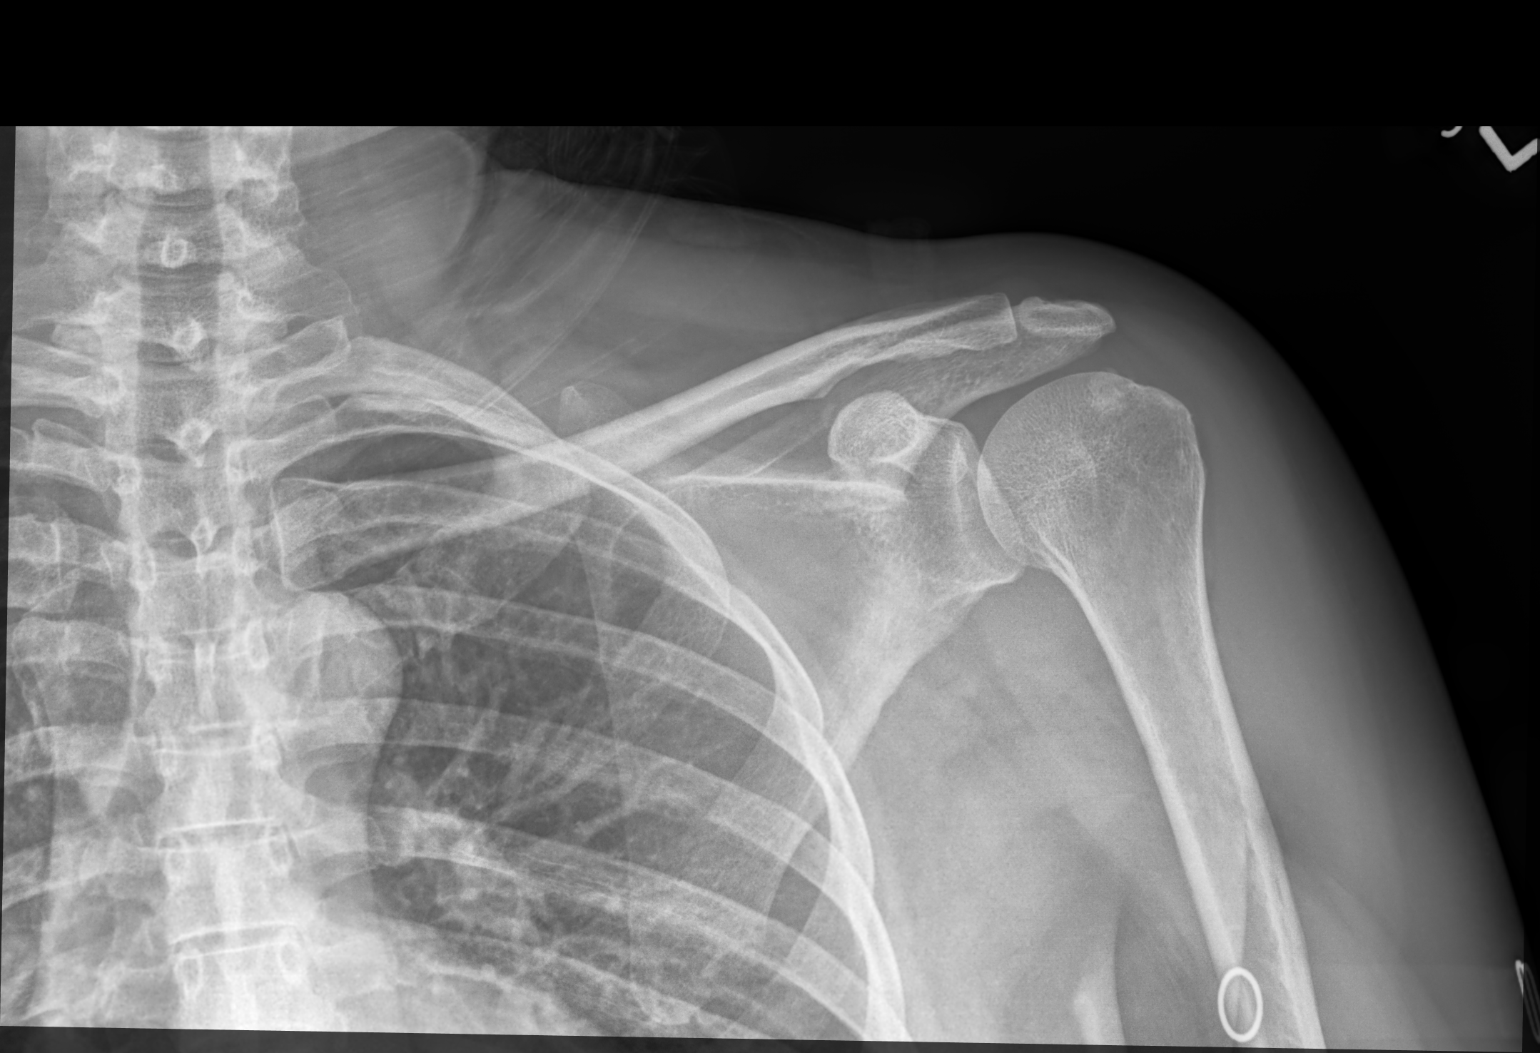

[shoulder external rotation ap]
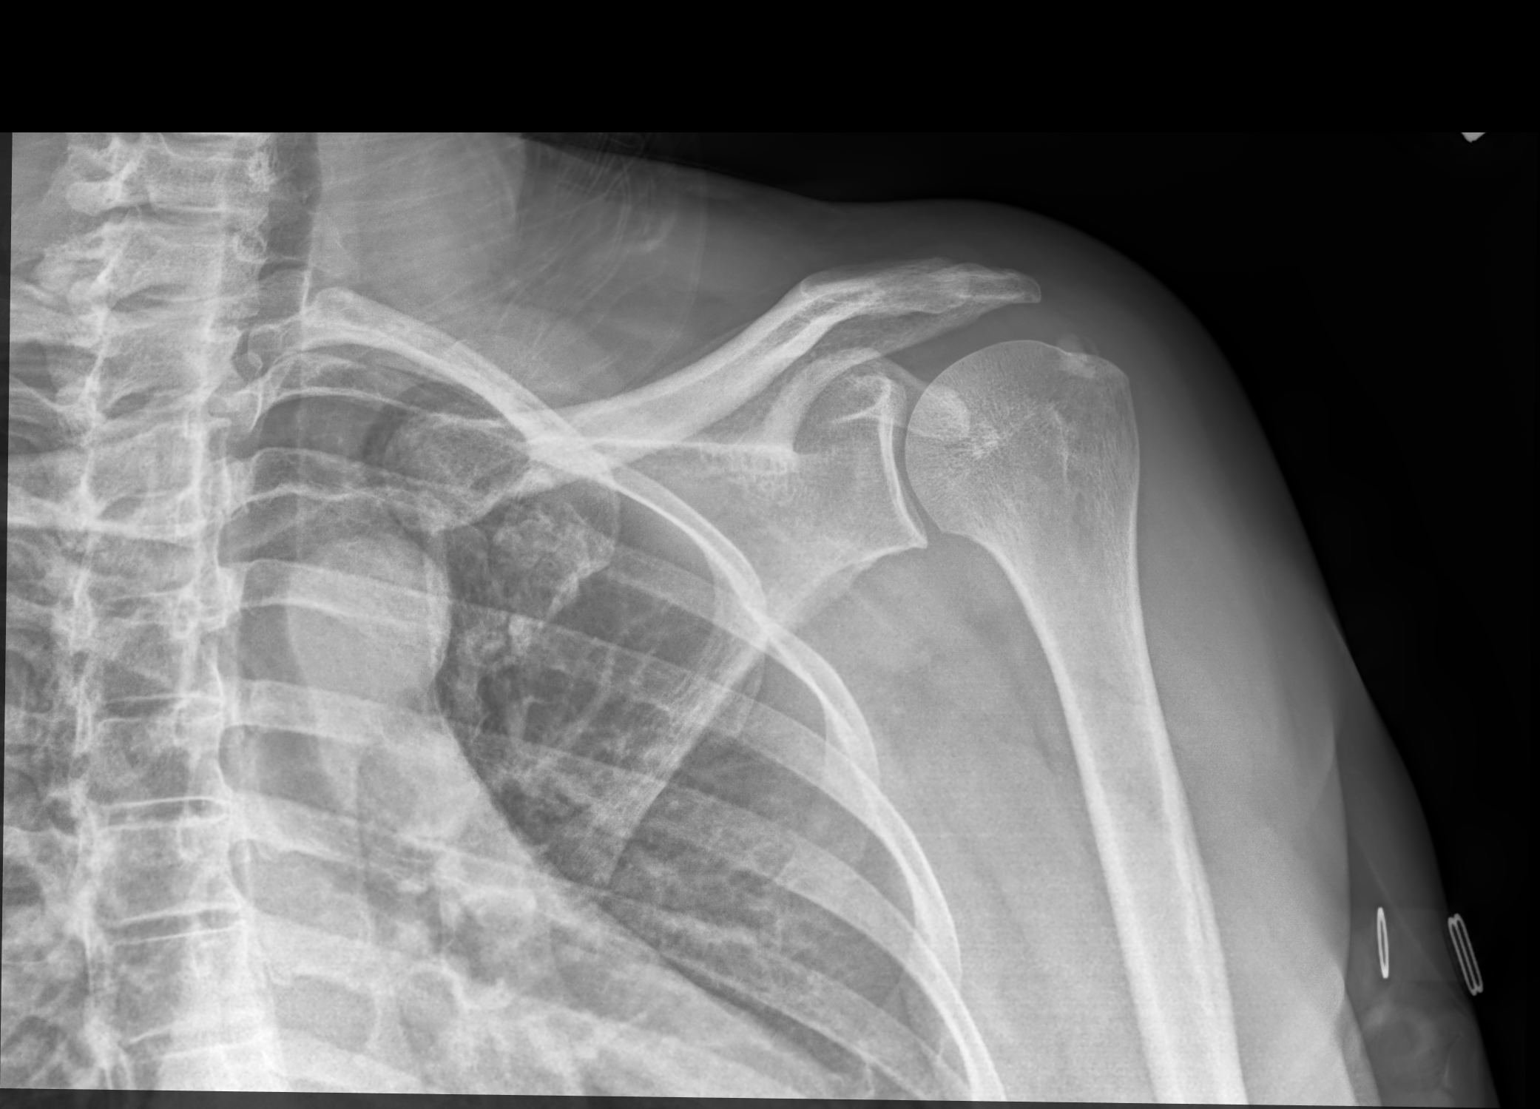

[shoulder (y view) lat]
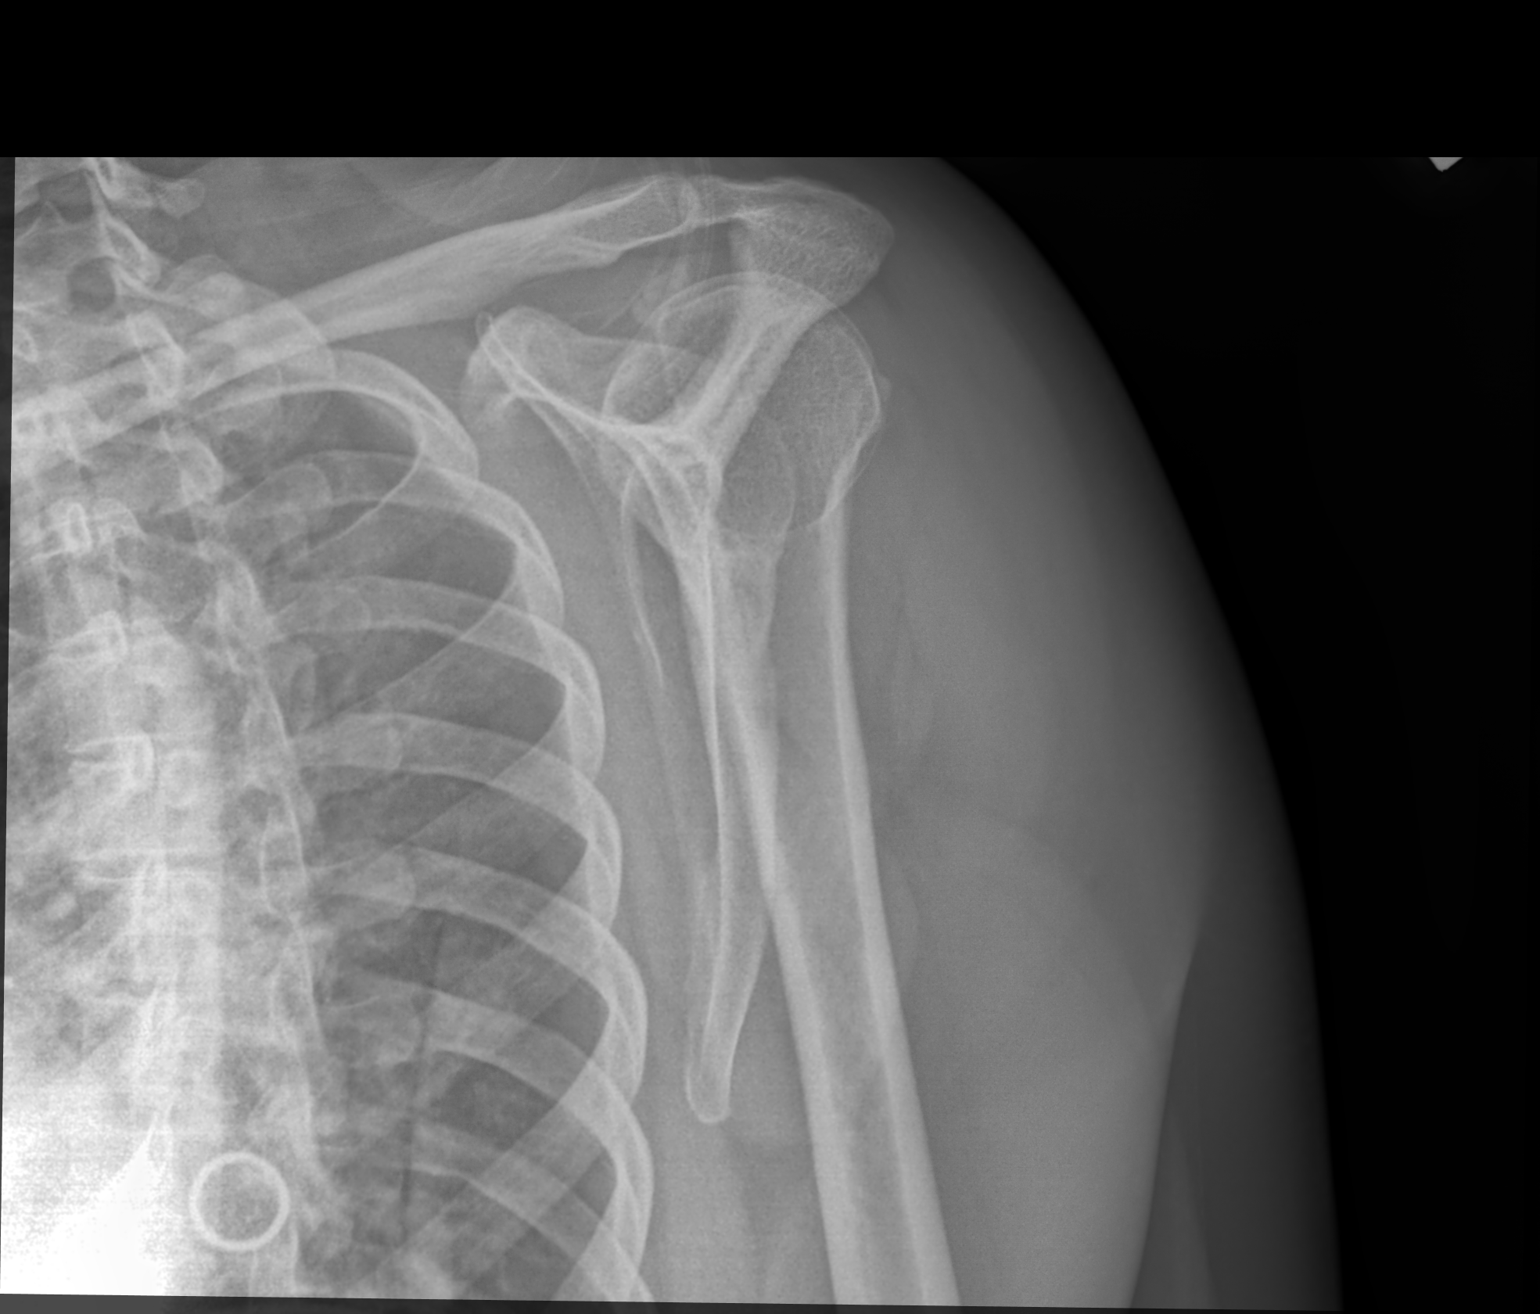

[3 of 3 positions shown; findings below may reference images not displayed]

FINDINGS: There is no evidence of fracture or dislocation. There is no
evidence of arthropathy or other focal bone abnormality. Soft
tissues are unremarkable.
IMPRESSION: Negative.

## 2024-05-12 IMAGING — DX DG ELBOW COMPLETE 3+V*L*
3 series · 3 of 3 positions shown · non-contrast
Comparison: None Available.

CLINICAL DATA: Trauma, pain

EXAM:
LEFT ELBOW - COMPLETE 3+ VIEW

[elbow ap]
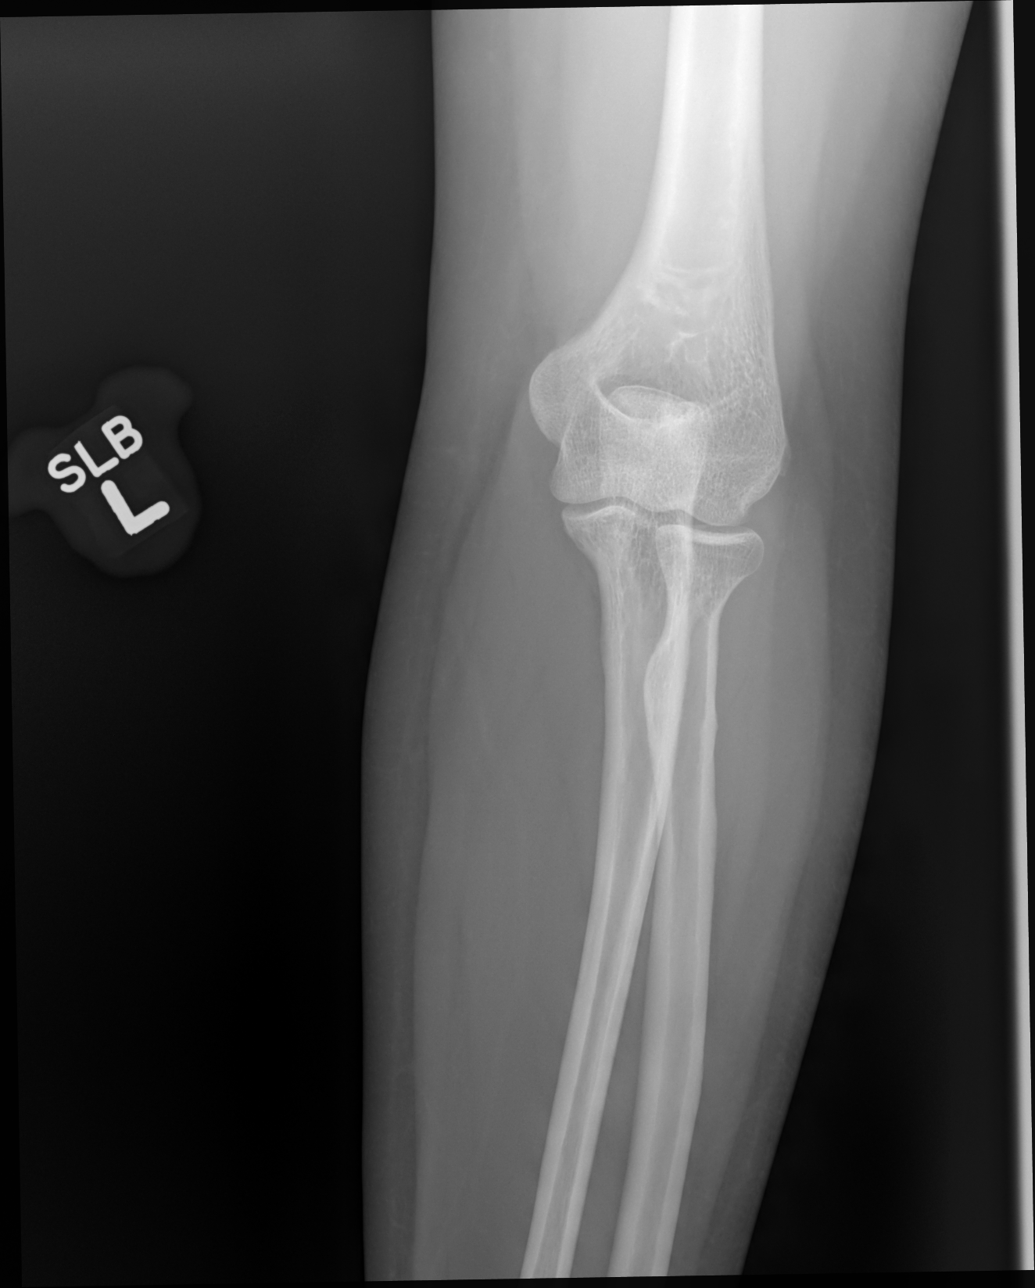

[elbow lmo]
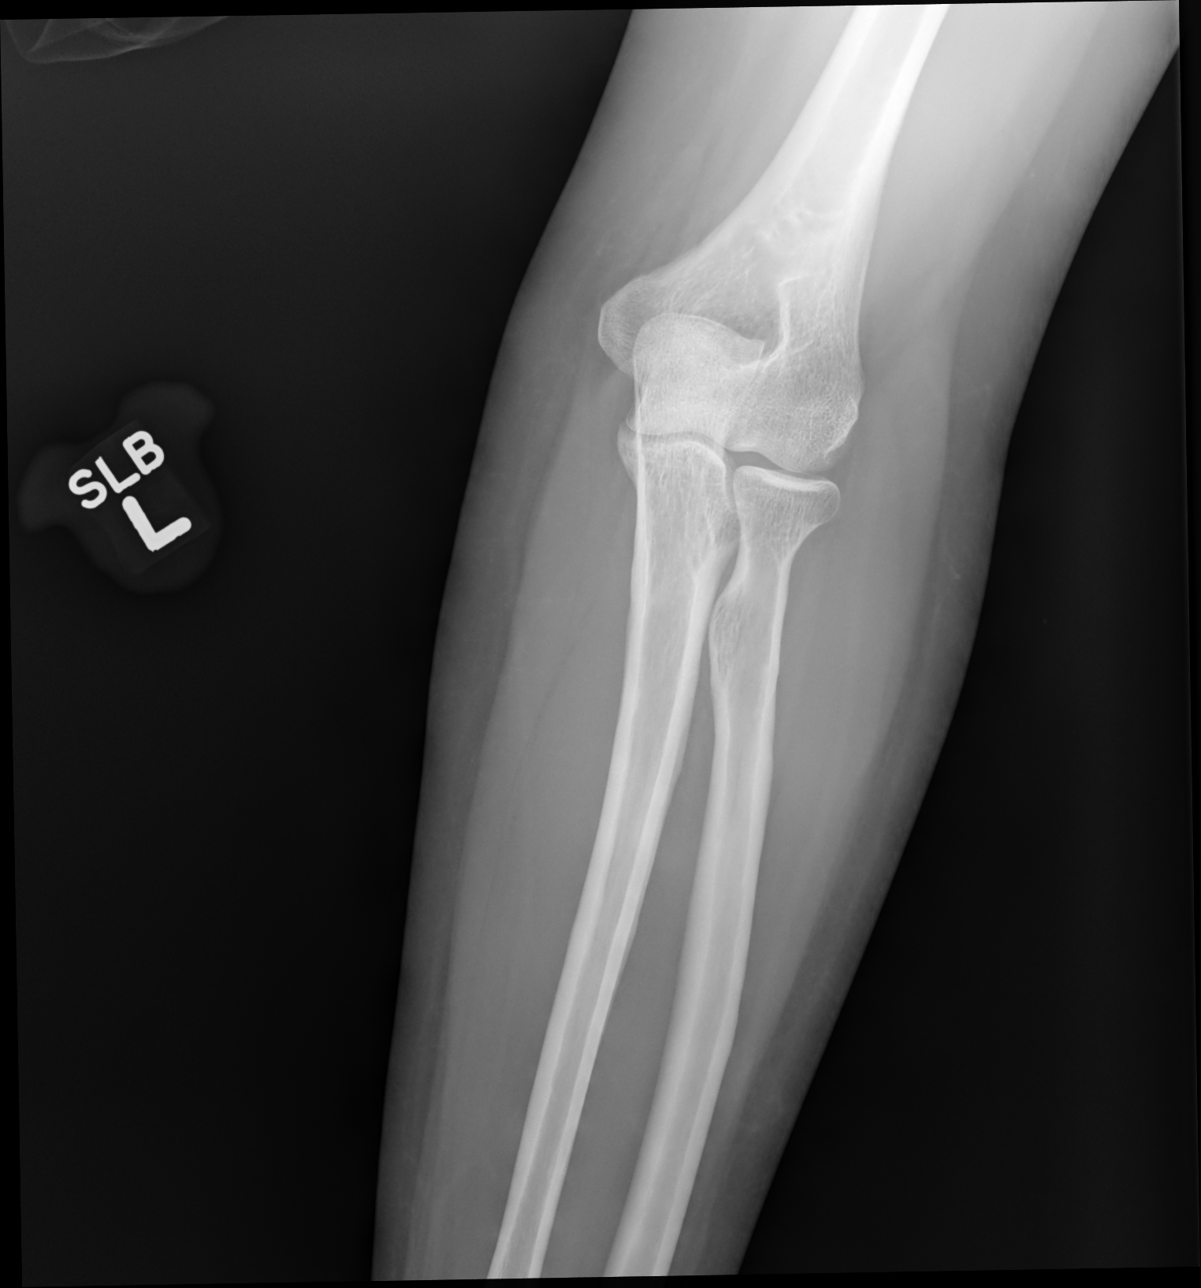

[elbow lat]
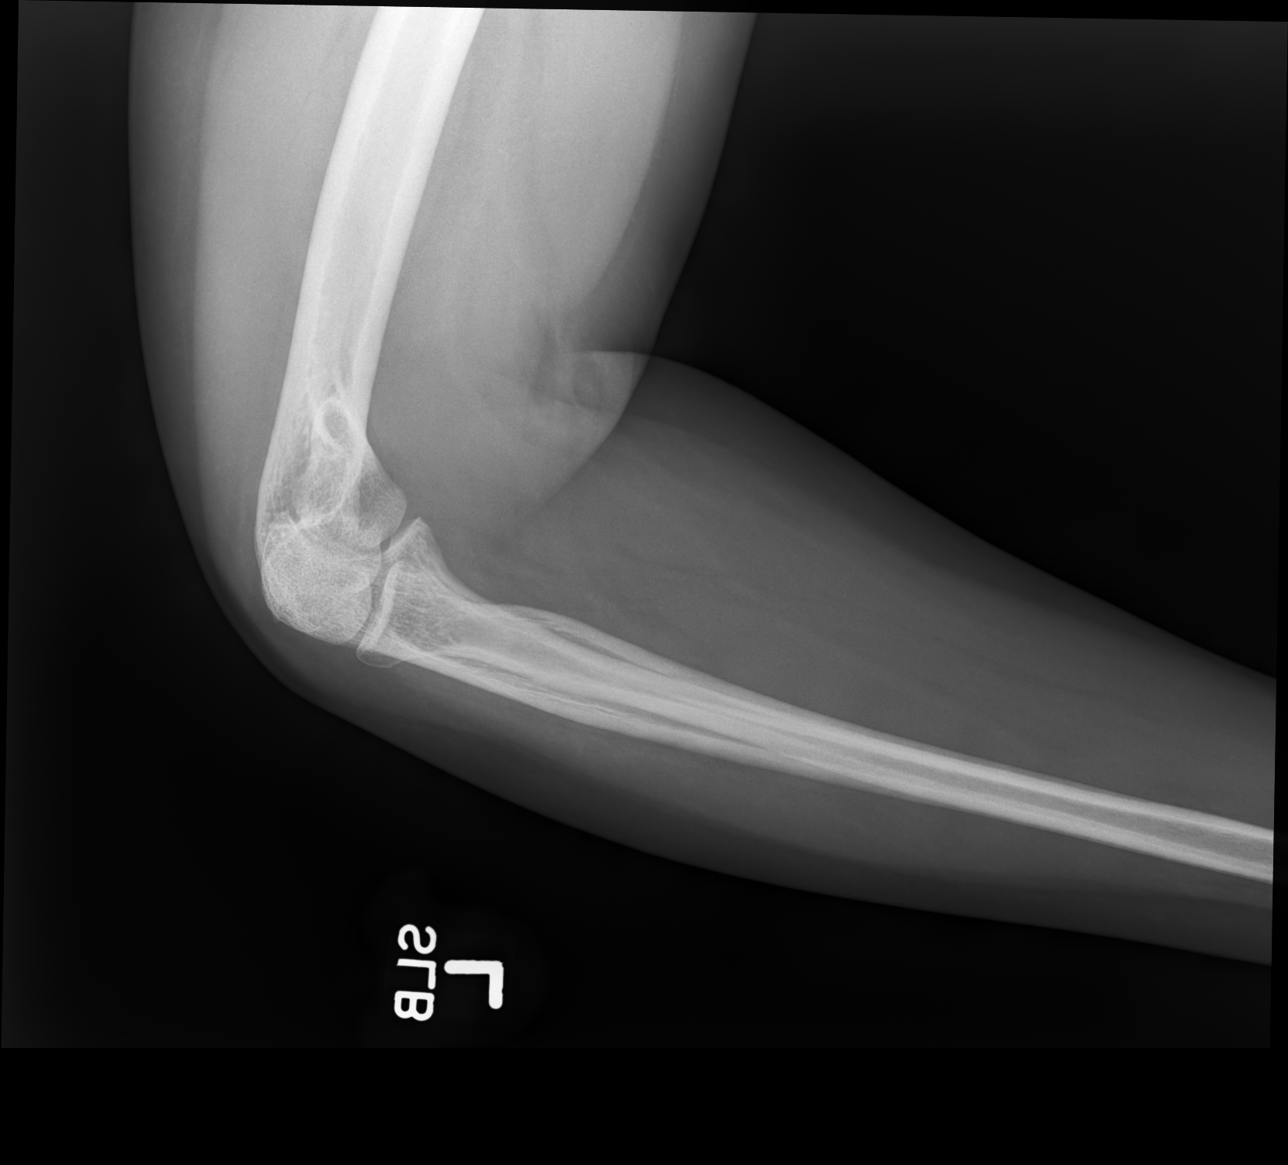

[3 of 3 positions shown; findings below may reference images not displayed]

FINDINGS: No displaced fracture or dislocation is seen. Lateral view is less
than optimal due to oblique positioning. As far as seen there is no
displacement of posterior fat pad. There are no opaque foreign
bodies.
IMPRESSION: No fracture or dislocation is seen in the left elbow.

## 2024-05-30 ENCOUNTER — Other Ambulatory Visit: Payer: Self-pay

## 2024-05-30 ENCOUNTER — Emergency Department (HOSPITAL_COMMUNITY)
Admission: EM | Admit: 2024-05-30 | Discharge: 2024-05-31 | Disposition: A | Discharge status: Home / Self Care | Attending: Emergency Medicine | Admitting: Emergency Medicine

## 2024-05-30 ENCOUNTER — Encounter (HOSPITAL_COMMUNITY): Payer: Self-pay | Admitting: Emergency Medicine

## 2024-05-30 DIAGNOSIS — R531 Weakness: Secondary | ICD-10-CM | POA: Insufficient documentation

## 2024-05-30 DIAGNOSIS — R42 Dizziness and giddiness: Secondary | ICD-10-CM | POA: Insufficient documentation

## 2024-05-30 LAB — BASIC METABOLIC PANEL WITH GFR
Anion gap: 12 (ref 5–15)
BUN: 7 mg/dL (ref 6–20)
CO2: 25 mmol/L (ref 22–32)
Calcium: 9.5 mg/dL (ref 8.9–10.3)
Chloride: 99 mmol/L (ref 98–111)
Creatinine, Ser: 0.74 mg/dL (ref 0.44–1.00)
GFR, Estimated: >60 mL/min (ref >60)
Glucose, Bld: 105 mg/dL — ABNORMAL HIGH (ref 70–99)
Potassium: 3.8 mmol/L (ref 3.5–5.1)
Sodium: 136 mmol/L (ref 135–145)

## 2024-05-30 LAB — CBC WITH DIFFERENTIAL/PLATELET
Abs Immature Granulocytes: 0.03 K/uL (ref 0.00–0.07)
Basophils Absolute: 0.1 K/uL (ref 0.0–0.1)
Basophils Relative: 1 %
Eosinophils Absolute: 0.3 K/uL (ref 0.0–0.5)
Eosinophils Relative: 3 %
HCT: 37.4 % (ref 36.0–46.0)
Hemoglobin: 12.5 g/dL (ref 12.0–15.0)
Immature Granulocytes: 0 %
Lymphocytes Relative: 22 %
Lymphs Abs: 2 K/uL (ref 0.7–4.0)
MCH: 29.6 pg (ref 26.0–34.0)
MCHC: 33.4 g/dL (ref 30.0–36.0)
MCV: 88.6 fL (ref 80.0–100.0)
Monocytes Absolute: 0.7 K/uL (ref 0.1–1.0)
Monocytes Relative: 8 %
Neutro Abs: 5.9 K/uL (ref 1.7–7.7)
Neutrophils Relative %: 66 %
Platelets: 402 K/uL — ABNORMAL HIGH (ref 150–400)
RBC: 4.22 MIL/uL (ref 3.87–5.11)
RDW: 13 % (ref 11.5–15.5)
WBC: 8.9 K/uL (ref 4.0–10.5)
nRBC: 0 % (ref 0.0–0.2)

## 2024-05-30 LAB — CBG MONITORING, ED: Glucose-Capillary: 77 mg/dL (ref 70–99)

## 2024-05-30 MED ORDER — MECLIZINE HCL 25 MG PO TABS
25.0000 mg | ORAL_TABLET | Freq: Three times a day (TID) | ORAL | 0 refills | Status: DC | PRN
Start: 2024-05-30 — End: 2024-07-13

## 2024-05-30 MED ORDER — PROCHLORPERAZINE EDISYLATE 10 MG/2ML IJ SOLN
10.0000 mg | Freq: Once | INTRAMUSCULAR | Status: AC
  Administered 2024-05-30: 10 mg via INTRAVENOUS
  Filled 2024-05-30: qty 2

## 2024-05-30 MED ORDER — LACTATED RINGERS IV BOLUS
1000.0000 mL | Freq: Once | INTRAVENOUS | Status: AC
  Administered 2024-05-30: 1000 mL via INTRAVENOUS

## 2024-05-30 MED ORDER — MECLIZINE HCL 12.5 MG PO TABS
25.0000 mg | ORAL_TABLET | Freq: Once | ORAL | Status: AC
  Administered 2024-05-30: 25 mg via ORAL
  Filled 2024-05-30: qty 2

## 2024-05-30 NOTE — ED Notes (Signed)
 ED Provider at bedside.

## 2024-05-30 NOTE — ED Provider Notes (Signed)
 [place] EMERGENC[facility] DEPARTMENT AT [facility] Provider Note  CSN: [id number] Arrival date & time: 05/30/24 2013  Chief Complaint(s) Weakness  HPI [name] is a 48 y.o. female presenting to the emergency department with feeling off balance.  Reports that started recently just prior to arrival, feels like she was going to fall over onto the floor.  Feels normal at rest but when she stands up feels like she is going to fall over.  Denies any nausea, vomiting.  Denies any headaches, head injury.  Denies any ear pain or tinnitus.  Reports prior episode few years ago.  Feels similar to that.  Does not feel spinning sensation or lightheadedness.  Does not feel like she is going to lose consciousness.  No back pain or neck pain.   Past Medical History Past Medical History:  Diagnosis Date   Anemia    GERD (gastroesophageal reflux disease)    Helicobacter pylori (H. pylori)    prevpac   Hemorrhoids    Patient Active Problem List   Diagnosis Date Noted   Vitamin D  insufficiency 02/26/2024   Prediabetes 02/26/2024   Low serum T4 level 02/26/2024   Dysuria 11/27/2023   Allergic rhinitis 11/27/2023   Hyperlipidemia 08/27/2023   Breast cancer screening by mammogram 08/27/2023   Seasonal allergies 08/27/2023   S/P abdominal supracervical subtotal hysterectomy 06/03/2017   GERD 03/07/2010   HELICOBACTER PYLORI INFECTION, HX OF 03/07/2010   Home Medication(s) Prior to Admission medications   Medication Sig Start Date End Date Taking? Authorizing Provider  meclizine  (ANTIVERT ) 25 MG tablet Take 1 tablet (25 mg total) by mouth 3 (three) times daily as needed for dizziness. 05/30/24  Yes Francesca Elsie CROME, MD  cetirizine (ZYRTEC) 10 MG tablet Take 10 mg by mouth daily.    [provider]  cholecalciferol (VITAMIN D3) 25 MCG (1000 UNIT) tablet Take 1,000 Units by mouth daily.    [provider]  ciprofloxacin  (CIPRO ) 500 MG tablet Take 500 mg by mouth 2  (two) times daily. 04/27/24   [provider]  fluconazole  (DIFLUCAN ) 100 MG tablet Take 100 mg by mouth daily. 04/27/24   [provider]  fluticasone (FLONASE) 50 MCG/ACT nasal spray Place 1 spray into both nostrils daily as needed for allergies or rhinitis.    [provider]  Multiple Vitamins-Minerals (MULTIVITAMIN ADULT) CHEW Chew 2 each by mouth daily.    [provider]  triamcinolone  cream (KENALOG ) 0.1 % Apply 1 Application topically 2 (two) times daily as needed (rash). 02/26/24   Melvenia Manus BRAVO, MD                                                                                                                                    Past Surgical History Past Surgical History:  Procedure Laterality Date   ABDOMINAL HYSTERECTOMY     APPENDECTOMY     48 years old  BIOPSY  05/30/2022   Procedure: BIOPSY;  Surgeon: Shaaron Lamar HERO, MD;  [facility];  Service: Endoscopy;;   COLONOSCOPY WITH PROPOFOL  N/A 05/30/2022   Procedure: COLONOSCOPY WITH PROPOFOL ;  Surgeon: Shaaron Lamar HERO, MD;  Location: [facility];  Service: Endoscopy;  Laterality: N/A;  2:00pm   ESOPHAGOGASTRODUODENOSCOPY  09/14/2007   Dr. Al quadrant distal esophageal erosion consistant with  moderately severe erosive reflux esophagitits, o/w normal esophagus.,tiny antral erosions o/w normal stomach, inflammation on bx   ESOPHAGOGASTRODUODENOSCOPY (EGD) WITH ESOPHAGEAL DILATION  12/09/2012   Dr. Shaaron- normal esophagus s/p maloney dilation, small hiatal hernia   POLYPECTOMY  05/30/2022   Procedure: POLYPECTOMY;  Surgeon: Shaaron Lamar HERO, MD;  Location: [facility];  Service: Endoscopy;;   SALPINGOOPHORECTOMY Right 06/03/2017   Procedure: SALPINGO OOPHORECTOMY;  Surgeon: Jayne Vonn DEL, MD;  Location: [facility];  Service: Gynecology;  Laterality: Right;   SUPRACERVICAL ABDOMINAL HYSTERECTOMY N/A 06/03/2017   Procedure: HYSTERECTOMY SUPRACERVICAL ABDOMINAL;  Surgeon: Jayne Vonn DEL,  MD;  Location: [facility];  Service: Gynecology;  Laterality: N/A;   UNILATERAL SALPINGECTOMY Left 06/03/2017   Procedure: left salpingectomy;  Surgeon: Jayne Vonn DEL, MD;  Location: [facility];  Service: Gynecology;  Laterality: Left;   Family History History reviewed. No pertinent family history.  Social History Social History   Tobacco Use   Smoking status: Never    Passive exposure: Never   Smokeless tobacco: Never  Vaping Use   Vaping status: Never Used  Substance Use Topics   Alcohol use: No   Drug use: No   Allergies Levaquin [levofloxacin], Tamiflu  [oseltamivir  phosphate], and Sulfonamide derivatives  Review of Systems Review of Systems  All other systems reviewed and are negative.   Physical Exam Vital Signs  I have reviewed the triage vital signs BP (!) 117/97   Pulse 73   Temp 98.4 F (36.9 C) (Oral)   Resp 17   Ht 5' 2 (1.575 m)   Wt 82 kg   LMP 06/03/2017   SpO2 97%   BMI 33.06 kg/m  Physical Exam Vitals and nursing note reviewed.  Constitutional:      General: She is not in acute distress.    Appearance: She is well-developed.  HENT:     Head: Normocephalic and atraumatic.     Right Ear: Tympanic membrane normal.     Left Ear: Tympanic membrane normal.     Mouth/Throat:     Mouth: Mucous membranes are moist.  Eyes:     Extraocular Movements: Extraocular movements intact.     Pupils: Pupils are equal, round, and reactive to light.  Cardiovascular:     Rate and Rhythm: Normal rate and regular rhythm.     Heart sounds: No murmur heard. Pulmonary:     Effort: Pulmonary effort is normal. No respiratory distress.     Breath sounds: Normal breath sounds.  Abdominal:     General: Abdomen is flat.     Palpations: Abdomen is soft.     Tenderness: There is no abdominal tenderness.  Musculoskeletal:        General: No tenderness.     Right lower leg: No edema.     Left lower leg: No edema.  Skin:    General: Skin is warm and dry.  Neurological:      General: No focal deficit present.     Mental Status: She is alert. Mental status is at baseline.     Comments: Cranial nerves II through XII intact,  strength 5 out of 5 in the bilateral upper and lower extremities, no sensory deficit to light touch, no dysmetria on finger-nose-finger testing, normal rapid alternating movements, normal heel/shin   Psychiatric:        Mood and Affect: Mood normal.        Behavior: Behavior normal.     ED Results and Treatments Labs (all labs ordered are listed, but only abnormal results are displayed) Labs Reviewed  BASIC METABOLIC PANEL WITH GFR - Abnormal; Notable for the following components:      Result Value   Glucose, Bld 105 (*)    All other components within normal limits  CBC WITH DIFFERENTIAL/PLATELET - Abnormal; Notable for the following components:   Platelets 402 (*)    All other components within normal limits  CBG MONITORING, ED                                                                                                                          Radiology No results found.  Pertinent labs & imaging results that were available during my care of the patient were reviewed by me and considered in my medical decision making (see MDM for details).  Medications Ordered in ED Medications  lactated ringers  bolus 1,000 mL (0 mLs Intravenous Stopped 05/30/24 2230)  meclizine  (ANTIVERT ) tablet 25 mg (25 mg Oral Given 05/30/24 2113)  prochlorperazine  (COMPAZINE ) injection 10 mg (10 mg Intravenous Given 05/30/24 2300)                                                                                                                                     Procedures Procedures  (including critical care time)  Medical Decision Making / ED Course   MDM:  48 year old presenting to the emergency department with disequilibrium.  Patient overall well-appearing, physical examination without focal neurologic finding other than abnormal gait.  Describes  symptoms worse disequilibrium and lightheadedness sensation.  Received meclizine  without much improvement.  Will try Compazine .  Lower concern for CNS cause.  If symptoms continue to be persistent may need further workup.  Clinical Course as of 05/30/24 2310  Mon May 30, 2024  2309 Signed out to Dr. Haze pending re-assessment  [WS]    Clinical Course User Index [WS] Francesca Elsie CROME, MD     Additional history obtained: -Additional history obtained from spouse -External records from outside source obtained and reviewed  including: Chart review including previous notes, labs, imaging, consultation notes including prior notes    Lab Tests: -I ordered, reviewed, and interpreted labs.   The pertinent results include:   Labs Reviewed  BASIC METABOLIC PANEL WITH GFR - Abnormal; Notable for the following components:      Result Value   Glucose, Bld 105 (*)    All other components within normal limits  CBC WITH DIFFERENTIAL/PLATELET - Abnormal; Notable for the following components:   Platelets 402 (*)    All other components within normal limits  CBG MONITORING, ED    Notable for mild thrombocytosis, hyperglycemia    EKG   EKG Interpretation Date/Time:  Monday May 30 2024 20:32:49 EDT Ventricular Rate:  82 PR Interval:  166 QRS Duration:  82 QT Interval:  364 QTC Calculation: 426 R Axis:   -11  Text Interpretation: Sinus rhythm LVH by voltage Confirmed by Francesca Fallow ([id number]) on 05/30/2024 11:03:49 PM          Medicines ordered and prescription drug management: Meds ordered this encounter  Medications   lactated ringers  bolus 1,000 mL   meclizine  (ANTIVERT ) tablet 25 mg   prochlorperazine  (COMPAZINE ) injection 10 mg   meclizine  (ANTIVERT ) 25 MG tablet    Sig: Take 1 tablet (25 mg total) by mouth 3 (three) times daily as needed for dizziness.    Dispense:  30 tablet    Refill:  0    -I have reviewed the patients home medicines and have made adjustments as  needed   Social Determinants of Health:  Diagnosis or treatment significantly limited by social determinants of health: obesity   Reevaluation: After the interventions noted above, I reevaluated the patient and found that their symptoms have improved  Co morbidities that complicate the patient evaluation  Past Medical History:  Diagnosis Date   Anemia    GERD (gastroesophageal reflux disease)    Helicobacter pylori (H. pylori)    prevpac   Hemorrhoids       Dispostion: Disposition decision including need for hospitalization was considered, and patient disposition pending at time of sign out.    Final Clinical Impression(s) / ED Diagnoses Final diagnoses:  Disequilibrium     This chart was dictated using voice recognition software.  Despite best efforts to proofread,  errors can occur which can change the documentation meaning.    Francesca Fallow CROME, MD 05/30/24 [phone]

## 2024-05-30 NOTE — ED Triage Notes (Signed)
 Pt c/o weakness and like she is going to pass out, states that she started feeling this way 15 minutes pta.

## 2024-05-30 NOTE — ED Notes (Signed)
 Pt assisted to use bedside commode. Pt is unsteady on their feet at this time requiring 1-2 man assist

## 2024-05-31 NOTE — ED Notes (Signed)
 Pt ambulated around the RN station with this RN and 2 NT. Pt denies any dizziness or vertigo sx.  EDP made aware

## 2024-05-31 NOTE — ED Notes (Signed)
 ED Provider at bedside.

## 2024-05-31 NOTE — ED Provider Notes (Signed)
 Patient signed out to me by Dr. Francesca to reevaluate.  Patient seen with disequilibrium, possible peripheral vertigo.  Patient received medications and IV fluids.  Upon recheck she reports that she feels much better.  She has been ambulatory without any symptoms.  Will discharge patient.   Haze Lonni PARAS, MD 05/31/24 [phone]

## 2024-06-02 ENCOUNTER — Encounter: Payer: Self-pay | Admitting: Family Medicine

## 2024-06-02 ENCOUNTER — Ambulatory Visit: Payer: Self-pay | Admitting: Family Medicine

## 2024-06-02 VITALS — BP 128/84 | HR 82 | Resp 18 | Ht 62.0 in

## 2024-06-02 DIAGNOSIS — R2681 Unsteadiness on feet: Secondary | ICD-10-CM | POA: Diagnosis not present

## 2024-06-02 DIAGNOSIS — R42 Dizziness and giddiness: Secondary | ICD-10-CM

## 2024-06-02 DIAGNOSIS — E785 Hyperlipidemia, unspecified: Secondary | ICD-10-CM

## 2024-06-02 DIAGNOSIS — Z09 Encounter for follow-up examination after completed treatment for conditions other than malignant neoplasm: Secondary | ICD-10-CM

## 2024-06-02 DIAGNOSIS — R7303 Prediabetes: Secondary | ICD-10-CM

## 2024-06-02 MED ORDER — PREDNISONE 5 MG (21) PO TBPK
5.0000 mg | ORAL_TABLET | ORAL | 0 refills | Status: DC
Start: 2024-06-02 — End: 2024-06-15

## 2024-06-02 NOTE — Patient Instructions (Addendum)
 Pls schedule an APPOINTMENT WITH NEW pROVIDER IN OFFICE I NEXT 4 TO 8 WEEKS,  at checkout,to f/u this ED visit, also her chronic problems with abnormal labs  and routine health maintainace ,    You are referred  for a brain scan and to ssee a neurologisy  6 day course of prednisone  is prescribed  Review of labs in January showed cholesterol to be high and that you  are prediabetic, please eat mainly vegetable, fruit, beans, lean meat and  drink only water  for better health  It is important that you exercise regularly at least 30 minutes 5 times a week. If you develop chest pain, have severe difficulty breathing, or feel very tired, stop exercising immediately and seek medical attention   Thanks for choosing [facility], we consider it a privelige to serve you.

## 2024-06-02 NOTE — Progress Notes (Addendum)
   [name]     MRN: [id number]      DOB: 1976/02/20  Chief Complaint  Patient presents with   Follow-up    Seen in ED on 07/21. States she still feels off balance but denies dizziness     HPI [name] is here for follow up of ED visit on 6/21 when she presented with an approximate 35 minute episode of light headedness as though she would pass out. States that she  was prescribed meclizine  but that she was never dizzy The following day she started experiencing unsteadiness in her gait , as though she will fall when walking, denies any other neurologic symptoms This is the second time this has happened to her to the extent that she had to seek ED evaluation first was in 2021, and has had several ED visits since with dx of Eustachian tube dysfunction, consistently denies vertigo Also reports a feeling of imbalance and unsteadiness when walking which developed 2 days later , but again denis a spinning sensation Reports that when this happended 4 years ago her previous PCP prescribed prednisone  which seemed to be helpful at the time ROS Denies recent fever or chills. Denies sinus pressure, nasal congestion, ear pain or sore throat. Denies chest congestion, productive cough or wheezing. Denies chest pains, palpitations and leg swelling Denies abdominal pain, nausea, vomiting,diarrhea or constipation.   Denies dysuria, frequency, hesitancy or incontinence. Denies joint pain, swelling and limitation in mobility. Denies skin break down or rash.   PE  BP 128/84   Pulse 82   Resp 18   Ht 5' 2 (1.575 m)   LMP 06/03/2017   SpO2 97%   BMI 33.06 kg/m   Patient alert and oriented and in no cardiopulmonary distress.  HEENT: No facial asymmetry, EOMI,     Neck supple .no bruit  Chest: Clear to auscultation bilaterally.  CVS: S1, S2 no murmurs, no S3.Regular rate.  ABD: Soft non tender.   Ext: No edema  MS: Adequate ROM spine, shoulders, hips and knees.  Skin: Intact,  no ulcerations or rash noted.  Psych: Good eye contact, normal affect. Memory intact not anxious or depressed appearing.  CNS: CN 2-12 intact, power,  normal throughout.no focal deficits noted.   Assessment & Plan  Light headedness Recurrent light headedness, over 30 min duration followed by unsteady gait, brain scan and neurology eval  Unsteady gait when walking New onset following recent episode of light headedness, needs scan and neurology eval based on history and presentation Prednisone  5 mg  dose pack prescribed  Hyperlipidemia Re eval needed  Prediabetes Updated lab needed and re evaluation   Hospital discharge follow-up Patient in for follow up of recent Ed visit. Discharge summary, and laboratory and radiology data are reviewed, and any questions or concerns  are discussed. Specific issues requiring follow up are specifically addressed.   Encounter for examination following treatment at hospital Patient in for follow up of recent ED visit. Discharge summary, and laboratory and radiology data are reviewed, and any questions or concerns  are discussed. Specific issues requiring follow up are specifically addressed.

## 2024-06-05 DIAGNOSIS — Z09 Encounter for follow-up examination after completed treatment for conditions other than malignant neoplasm: Secondary | ICD-10-CM | POA: Insufficient documentation

## 2024-06-05 NOTE — Assessment & Plan Note (Signed)
Patient in for follow up of recent ED visit. Discharge summary, and laboratory and radiology data are reviewed, and any questions or concerns  are discussed. Specific issues requiring follow up are specifically addressed.  

## 2024-06-05 NOTE — Assessment & Plan Note (Signed)
Patient in for follow up of recent Ed visit Discharge summary, and laboratory and radiology data are reviewed, and any questions or concerns  are discussed. Specific issues requiring follow up are specifically addressed.  

## 2024-06-05 NOTE — Assessment & Plan Note (Signed)
 Re eval needed

## 2024-06-05 NOTE — Assessment & Plan Note (Signed)
 Updated lab needed and re evaluation

## 2024-06-05 NOTE — Assessment & Plan Note (Addendum)
 New onset following recent episode of light headedness, needs scan and neurology eval based on history and presentation Prednisone  5 mg  dose pack prescribed

## 2024-06-05 NOTE — Assessment & Plan Note (Signed)
 Recurrent light headedness, over 30 min duration followed by unsteady gait, brain scan and neurology eval

## 2024-06-09 ENCOUNTER — Ambulatory Visit (HOSPITAL_COMMUNITY)
Admission: RE | Admit: 2024-06-09 | Discharge: 2024-06-09 | Disposition: A | Discharge status: Home / Self Care | Source: Ambulatory Visit | Attending: Family Medicine | Admitting: Family Medicine

## 2024-06-09 DIAGNOSIS — R42 Dizziness and giddiness: Secondary | ICD-10-CM | POA: Insufficient documentation

## 2024-06-09 DIAGNOSIS — R2681 Unsteadiness on feet: Secondary | ICD-10-CM | POA: Diagnosis present

## 2024-06-15 ENCOUNTER — Other Ambulatory Visit: Payer: Self-pay

## 2024-06-15 ENCOUNTER — Ambulatory Visit: Payer: Self-pay

## 2024-06-15 MED ORDER — PREDNISONE 5 MG (21) PO TBPK: 5.0000 mg | ORAL_TABLET | ORAL | 0 refills | Status: DC

## 2024-06-15 NOTE — Telephone Encounter (Signed)
 FYI Only or Action Required?: Action required by provider: request for appointment and medication refill request.  Patient was last seen in primary care on 06/02/2024 by Antonetta Rollene BRAVO, MD.  Called Nurse Triage reporting Dizziness.  Symptoms began several weeks ago.  Interventions attempted: Prescription medications: prednisone .  Symptoms are: unchanged.  Triage Disposition: See PCP When Office is Open (Within 3 Days)  Patient/caregiver understands and will follow disposition?: Yes       Copied from CRM #[id number]. Topic: Clinical - Red Word Triage >> Jun 15, 2024  1:44 PM Carla L wrote: Red Word that prompted transfer to Nurse Triage: Patient needing appointment as she is still having symptoms, patient feeling like she is going to pass out (light headedness) Reason for Disposition  [1] MODERATE dizziness (e.g., interferes with normal activities) AND [2] has been evaluated by doctor (or NP/PA) for this  Answer Assessment - Initial Assessment Questions 1. DESCRIPTION: Describe your dizziness.     lighthead 2. LIGHTHEADED: Do you feel lightheaded? (e.g., somewhat faint, woozy, weak upon standing)     Feel like she is going to pass out 3. VERTIGO: Do you feel like either you or the room is spinning or tilting? (i.e., vertigo)     no 4. SEVERITY: How bad is it?  Do you feel like you are going to faint? Can you stand and walk?     Yes, but it comes and goes 5. ONSET:  When did the dizziness begin?     Over a week ago  6. AGGRAVATING FACTORS: Does anything make it worse? (e.g., standing, change in head position)     no 7. HEART RATE: Can you tell me your heart rate? How many beats in 15 seconds?  (Note: Not all patients can do this.)       no 8. CAUSE: What do you think is causing the dizziness? (e.g., decreased fluids or food, diarrhea, emotional distress, heat exposure, new medicine, sudden standing, vomiting; unknown)     Inner ear at ED  9. RECURRENT  SYMPTOM: Have you had dizziness before? If Yes, ask: When was the last time? What happened that time?     today 10. OTHER SYMPTOMS: Do you have any other symptoms? (e.g., fever, chest pain, vomiting, diarrhea, bleeding)       no  Protocols used: Dizziness - Lightheadedness-A-AH

## 2024-06-15 NOTE — Telephone Encounter (Signed)
 Prednisone  refill today

## 2024-06-15 NOTE — Telephone Encounter (Signed)
 Pt states symtpoms not better after prednisone  and is requesting another round or appt until she can get in with neurology in Nov.

## 2024-06-16 NOTE — Telephone Encounter (Signed)
Pt informed

## 2024-06-28 ENCOUNTER — Telehealth: Payer: Self-pay

## 2024-06-28 ENCOUNTER — Ambulatory Visit: Payer: Self-pay | Admitting: Family Medicine

## 2024-06-28 ENCOUNTER — Ambulatory Visit: Admitting: Obstetrics & Gynecology

## 2024-06-28 NOTE — Telephone Encounter (Signed)
 Copied from CRM [id number]. Topic: General - Call Back - No Documentation >> Jun 28, 2024  4:00 PM Charlet HERO wrote: Reason for CRM: Patient is returning call to [facility]. Patient is wanting to have call back

## 2024-06-29 NOTE — Telephone Encounter (Signed)
 Lvm to cb, please see result note when she returns call

## 2024-07-05 ENCOUNTER — Ambulatory Visit: Admitting: Obstetrics & Gynecology

## 2024-07-13 ENCOUNTER — Ambulatory Visit

## 2024-07-13 VITALS — BP 128/68 | HR 95

## 2024-07-13 DIAGNOSIS — E785 Hyperlipidemia, unspecified: Secondary | ICD-10-CM | POA: Diagnosis not present

## 2024-07-13 DIAGNOSIS — E559 Vitamin D deficiency, unspecified: Secondary | ICD-10-CM

## 2024-07-13 DIAGNOSIS — R7303 Prediabetes: Secondary | ICD-10-CM

## 2024-07-13 DIAGNOSIS — J3089 Other allergic rhinitis: Secondary | ICD-10-CM

## 2024-07-13 MED ORDER — FLUTICASONE PROPIONATE 50 MCG/ACT NA SUSP: 1.0000 | Freq: Every day | NASAL | 11 refills | Status: AC | PRN

## 2024-07-13 NOTE — Progress Notes (Signed)
 Established Patient Office Visit  Subjective   Patient ID: [name], female    DOB: 04/22/1976  Age: 48 y.o. MRN: [id number]  Chief Complaint  Patient presents with   Follow-up    Light headedness   Establish Care    HPI Discussed the use of AI scribe software for clinical note transcription with the patient, who gave verbal consent to proceed.  History of Present Illness   [name] is a 48 year old female who presents to establish care after a recent hospitalization for an inner ear infection.  Vestibular symptoms - Lightheadedness and balance disturbances, primarily affecting the right side, with a sensation of impending fall - Initial episode in 2021, resolved with two courses of prednisone  - Recurrence in late June 2025, treated as inner ear infection with prednisone , with symptom relief after two doses - MRI of the brain performed during recent episode, with results reported as normal - No current symptoms reported at this visit  Hyperlipidemia - Previously elevated cholesterol levels - Managing cholesterol through lifestyle modifications, including increased physical activity (walking six to seven days per week) and dietary changes (reduced cheese intake, increased vegetable consumption) - Not currently taking prescribed medications for cholesterol  Vitamin d  deficiency - Previously low vitamin D  levels - Taking over-the-counter vitamin D  2000 IU daily since January 2025 - Vitamin D  levels have improved since starting supplementation  Allergic rhinitis and dermatologic symptoms - Uses fluticasone  nasal spray as needed, initially prescribed for inner ear issues - Applies topical cream for dry skin as needed during winter months      Patient Active Problem List   Diagnosis Date Noted   Encounter for examination following treatment at hospital 06/05/2024   Light headedness 06/02/2024   Unsteady gait when walking 06/02/2024   Vitamin D   insufficiency 02/26/2024   Prediabetes 02/26/2024   Low serum T4 level 02/26/2024   Dysuria 11/27/2023   Allergic rhinitis 11/27/2023   Hyperlipidemia 08/27/2023   Breast cancer screening by mammogram 08/27/2023   Seasonal allergies 08/27/2023   S/P abdominal supracervical subtotal hysterectomy 06/03/2017   GERD 03/07/2010   HELICOBACTER PYLORI INFECTION, HX OF 03/07/2010    ROS    Objective:     BP 128/68 (BP Location: Left Arm, Patient Position: Sitting, Cuff Size: Large)   Pulse 95   LMP 06/03/2017   SpO2 98%  BP Readings from Last 3 Encounters:  07/13/24 128/68  06/02/24 128/84  05/31/24 (!) 118/95   Wt Readings from Last 3 Encounters:  05/30/24 180 lb 12.4 oz (82 kg)  04/09/24 181 lb (82.1 kg)  02/26/24 178 lb 6.4 oz (80.9 kg)     Physical Exam Vitals and nursing note reviewed.  Constitutional:      Appearance: Normal appearance.  HENT:     Head: Normocephalic.  Eyes:     Extraocular Movements: Extraocular movements intact.     Pupils: Pupils are equal, round, and reactive to light.  Cardiovascular:     Rate and Rhythm: Normal rate and regular rhythm.  Pulmonary:     Effort: Pulmonary effort is normal.     Breath sounds: Normal breath sounds.  Musculoskeletal:     Cervical back: Normal range of motion and neck supple.  Neurological:     Mental Status: She is alert and oriented to person, place, and time.  Psychiatric:        Mood and Affect: Mood normal.        Thought Content: Thought content  normal.    No results found for any visits on 07/13/24.    The 10-year ASCVD risk score (Arnett DK, et al., 2019) is: 1.1%    Assessment & Plan:   Problem List Items Addressed This Visit       Respiratory   Allergic rhinitis   Managed with fluticasone  nasal spray, preferred due to insurance cost benefits. - Prescribe fluticasone  nasal spray as needed.      Relevant Medications   fluticasone  (FLONASE ) 50 MCG/ACT nasal spray     Other    Hyperlipidemia - Primary   Managed with lifestyle modifications. Last lipid panel in January. - Order fasting lipid panel to recheck cholesterol levels. - Encourage continuation of current lifestyle modifications.      Relevant Orders   CMP14+EGFR   Lipid Profile   Vitamin D  insufficiency   Previously diagnosed, taking 2000 IU daily. Levels improved but low normal. - Continue daily vitamin D  supplementation at 2000 IU.      Relevant Orders   Vitamin D  (25 hydroxy)   Prediabetes   Slightly elevated blood sugar, likely non-fasting, managed with lifestyle modifications. - Order fasting blood glucose and A1c to assess current status. - Encourage continuation of current lifestyle modifications.      Relevant Orders   CMP14+EGFR   HgB A1c         Return in about 1 year (around 07/13/2025) for chronic follow-up with PCP.   Leita Longs, FNP

## 2024-07-17 NOTE — Assessment & Plan Note (Signed)
 Managed with fluticasone  nasal spray, preferred due to insurance cost benefits. - Prescribe fluticasone  nasal spray as needed.

## 2024-07-17 NOTE — Assessment & Plan Note (Signed)
 Previously diagnosed, taking 2000 IU daily. Levels improved but low normal. - Continue daily vitamin D  supplementation at 2000 IU.

## 2024-07-17 NOTE — Assessment & Plan Note (Signed)
 Slightly elevated blood sugar, likely non-fasting, managed with lifestyle modifications. - Order fasting blood glucose and A1c to assess current status. - Encourage continuation of current lifestyle modifications.

## 2024-07-17 NOTE — Assessment & Plan Note (Signed)
 Managed with lifestyle modifications. Last lipid panel in January. - Order fasting lipid panel to recheck cholesterol levels. - Encourage continuation of current lifestyle modifications.

## 2024-08-29 ENCOUNTER — Ambulatory Visit (INDEPENDENT_AMBULATORY_CARE_PROVIDER_SITE_OTHER)

## 2024-08-29 VITALS — BP 123/81 | HR 85 | Ht 62.0 in

## 2024-08-29 DIAGNOSIS — R7303 Prediabetes: Secondary | ICD-10-CM

## 2024-08-29 DIAGNOSIS — Z0001 Encounter for general adult medical examination with abnormal findings: Secondary | ICD-10-CM

## 2024-08-29 DIAGNOSIS — E785 Hyperlipidemia, unspecified: Secondary | ICD-10-CM

## 2024-08-29 DIAGNOSIS — E559 Vitamin D deficiency, unspecified: Secondary | ICD-10-CM | POA: Diagnosis not present

## 2024-08-29 DIAGNOSIS — R7989 Other specified abnormal findings of blood chemistry: Secondary | ICD-10-CM

## 2024-08-29 DIAGNOSIS — Z0000 Encounter for general adult medical examination without abnormal findings: Secondary | ICD-10-CM

## 2024-08-29 NOTE — Progress Notes (Unsigned)
 Complete physical exam  Patient: [name]   DOB: 1976/09/11   48 y.o. Female  MRN: [id number]  Subjective:    Chief Complaint  Patient presents with   Medical Management of Chronic Issues    CPE  Discussed the use of AI scribe software for clinical note transcription with the patient, who gave verbal consent to proceed.  History of Present Illness   [name] is a 48 year old female who presents for an annual physical exam.  Chest pain - Experienced a sharp, 'really sharp' pain in the chest area last week - Soreness in the chest the following morning - No associated lump or bump - No heartburn or acid reflux at the time of the pain  Gastrointestinal symptoms - History of acid reflux - No heartburn or acid reflux during recent chest pain episode - Increased fiber intake resulting in more frequent bowel movements  Physical activity and lifestyle - Engages in regular physical activity, including walking and gym workouts - Making dietary improvements by increasing intake of tuna, vegetables, and fruits  Medication use - Uses fluticasone  daily for inner ear issues - Recently called in a refill for fluticasone   Immunization status - Received a flu shot two Saturdays ago - No COVID vaccines since the initial series       [name] is a 48 y.o. female who presents today for a complete physical exam. She reports consuming a general diet. The patient does not participate in regular exercise at present. She generally feels well. She reports sleeping well. She does not have additional problems to discuss today.    Most recent fall risk assessment:    06/02/2024   10:15 AM  Fall Risk   Falls in the past year? 0  Number falls in past yr: 0  Injury with Fall? 0  Follow up Falls evaluation completed     Most recent depression screenings:    06/02/2024   10:15 AM 02/26/2024    9:36 AM  PHQ 2/9 Scores  PHQ - 2 Score 0 0  PHQ- 9 Score  0         Patient Care Team: Bevely Doffing, FNP as PCP - General (Family Medicine) Shaaron Lamar HERO, MD (Gastroenterology)   Outpatient Medications Prior to Visit  Medication Sig   cetirizine (ZYRTEC) 10 MG tablet Take 10 mg by mouth daily.   cholecalciferol (VITAMIN D3) 25 MCG (1000 UNIT) tablet Take 1,000 Units by mouth daily.   fluticasone  (FLONASE ) 50 MCG/ACT nasal spray Place 1 spray into both nostrils daily as needed for allergies or rhinitis.   Multiple Vitamins-Minerals (MULTIVITAMIN ADULT) CHEW Chew 2 each by mouth daily.   triamcinolone  cream (KENALOG ) 0.1 % Apply 1 Application topically 2 (two) times daily as needed (rash).   No facility-administered medications prior to visit.    ROS     Objective:     BP 123/81   Pulse 85   Ht 5' 2 (1.575 m)   LMP 06/03/2017   SpO2 98%   BMI 33.06 kg/m    Physical Exam Vitals and nursing note reviewed.  Constitutional:      Appearance: Normal appearance.  HENT:     Head: Normocephalic.     Right Ear: Tympanic membrane, ear canal and external ear normal.     Left Ear: Tympanic membrane, ear canal and external ear normal.     Nose: Nose normal.     Mouth/Throat:     Mouth: Mucous  membranes are moist.     Pharynx: Oropharynx is clear.  Eyes:     Extraocular Movements: Extraocular movements intact.     Pupils: Pupils are equal, round, and reactive to light.  Cardiovascular:     Rate and Rhythm: Normal rate and regular rhythm.  Pulmonary:     Effort: Pulmonary effort is normal.     Breath sounds: Normal breath sounds.  Abdominal:     General: Bowel sounds are normal.     Palpations: Abdomen is soft.  Musculoskeletal:        General: Normal range of motion.     Cervical back: Normal range of motion and neck supple.  Skin:    General: Skin is warm and dry.  Neurological:     Mental Status: She is alert and oriented to person, place, and time.  Psychiatric:        Mood and Affect: Mood normal.        Thought  Content: Thought content normal.          Assessment & Plan:    Routine Health Maintenance and Physical Exam  Immunization History  Administered Date(s) Administered   Fluzone Influenza virus vaccine,trivalent (IIV3), split virus 08/20/2024   Influenza Inj Mdck Quad Pf 08/16/2019   Influenza,inj,Quad PF,6+ Mos 08/25/2023   Influenza-Unspecified 09/23/2017, 08/25/2023    Health Maintenance  Topic Date Due   DTaP/Tdap/Td (1 - Tdap) Never done   Hepatitis B Vaccines 19-59 Average Risk (1 of 3 - 19+ 3-dose series) Never done   COVID-19 Vaccine (1 - 2025-26 season) 09/14/2024 (Originally 07/11/2024)   Mammogram  11/29/2025   Cervical Cancer Screening (HPV/Pap Cotest)  02/21/2029   Colonoscopy  05/30/2032   Influenza Vaccine  Completed   Hepatitis C Screening  Completed   HIV Screening  Completed   Pneumococcal Vaccine  Aged Out   HPV VACCINES  Aged Out   Meningococcal B Vaccine  Aged Out    Discussed health benefits of physical activity, and encouraged her to engage in regular exercise appropriate for her age and condition.  Problem List Items Addressed This Visit       Other   Hyperlipidemia   Relevant Orders   CMP14+EGFR (Completed)   Lipid Profile (Completed)   Vitamin D  insufficiency   Relevant Orders   Vitamin D  (25 hydroxy) (Completed)   Prediabetes   Relevant Orders   CMP14+EGFR (Completed)   HgB A1c (Completed)   Low serum T4 level   Relevant Orders   TSH + free T4 (Completed)   Other Visit Diagnoses       Encounter for preventative adult health care examination    -  Primary   Engages in regular physical activity and dietary modifications. Recent sharp chest pain resolved spontaneously. Ordered blood work and provided lab orders.   Relevant Orders   CMP14+EGFR (Completed)   Lipid Profile (Completed)   CBC with Differential/Platelet (Completed)   HgB A1c (Completed)   TSH + free T4 (Completed)   Vitamin D  (25 hydroxy) (Completed)       Return in  about 1 year (around 08/29/2025) for for yearly physical.     Leita Longs, FNP

## 2024-08-30 LAB — LIPID PANEL
Chol/HDL Ratio: 3.6 ratio (ref 0.0–4.4)
Cholesterol, Total: 242 mg/dL — ABNORMAL HIGH (ref 100–199)
HDL: 67 mg/dL (ref >39)
LDL Chol Calc (NIH): 146 mg/dL — ABNORMAL HIGH (ref 0–99)
Triglycerides: 161 mg/dL — ABNORMAL HIGH (ref 0–149)
VLDL Cholesterol Cal: 29 mg/dL (ref 5–40)

## 2024-08-30 LAB — CMP14+EGFR
ALT: 13 IU/L (ref 0–32)
AST: 18 IU/L (ref 0–40)
Albumin: 4.3 g/dL (ref 3.9–4.9)
Alkaline Phosphatase: 115 IU/L (ref 41–116)
BUN/Creatinine Ratio: 8 — ABNORMAL LOW (ref 9–23)
BUN: 7 mg/dL (ref 6–24)
Bilirubin Total: 0.3 mg/dL (ref 0.0–1.2)
CO2: 21 mmol/L (ref 20–29)
Calcium: 9.6 mg/dL (ref 8.7–10.2)
Chloride: 102 mmol/L (ref 96–106)
Creatinine, Ser: 0.88 mg/dL (ref 0.57–1.00)
Globulin, Total: 3.1 g/dL (ref 1.5–4.5)
Glucose: 90 mg/dL (ref 70–99)
Potassium: 4.5 mmol/L (ref 3.5–5.2)
Sodium: 138 mmol/L (ref 134–144)
Total Protein: 7.4 g/dL (ref 6.0–8.5)
eGFR: 82 mL/min/1.73 (ref >59)

## 2024-08-30 LAB — CBC WITH DIFFERENTIAL/PLATELET
Basophils Absolute: 0.1 x10E3/uL (ref 0.0–0.2)
Basos: 1 %
EOS (ABSOLUTE): 0.3 x10E3/uL (ref 0.0–0.4)
Eos: 3 %
Hematocrit: 39.3 % (ref 34.0–46.6)
Hemoglobin: 12.9 g/dL (ref 11.1–15.9)
Immature Grans (Abs): 0 x10E3/uL (ref 0.0–0.1)
Immature Granulocytes: 0 %
Lymphocytes Absolute: 1.7 x10E3/uL (ref 0.7–3.1)
Lymphs: 20 %
MCH: 29.5 pg (ref 26.6–33.0)
MCHC: 32.8 g/dL (ref 31.5–35.7)
MCV: 90 fL (ref 79–97)
Monocytes Absolute: 0.7 x10E3/uL (ref 0.1–0.9)
Monocytes: 8 %
Neutrophils Absolute: 5.9 x10E3/uL (ref 1.4–7.0)
Neutrophils: 68 %
Platelets: 425 x10E3/uL (ref 150–450)
RBC: 4.37 x10E6/uL (ref 3.77–5.28)
RDW: 13 % (ref 11.7–15.4)
WBC: 8.5 x10E3/uL (ref 3.4–10.8)

## 2024-08-30 LAB — TSH+FREE T4
Free T4: 0.74 ng/dL — ABNORMAL LOW (ref 0.82–1.77)
TSH: 2.69 u[IU]/mL (ref 0.450–4.500)

## 2024-08-30 LAB — VITAMIN D 25 HYDROXY (VIT D DEFICIENCY, FRACTURES): Vit D, 25-Hydroxy: 32.9 ng/mL (ref 30.0–100.0)

## 2024-08-30 LAB — HEMOGLOBIN A1C
Est. average glucose Bld gHb Est-mCnc: 120 mg/dL
Hgb A1c MFr Bld: 5.8 % — ABNORMAL HIGH (ref 4.8–5.6)

## 2024-09-01 ENCOUNTER — Ambulatory Visit: Payer: Self-pay

## 2024-09-02 ENCOUNTER — Telehealth: Payer: Self-pay

## 2024-09-02 NOTE — Telephone Encounter (Signed)
 Called pt to let her know that Her labs show some improvement of cholesterol levels, although they are still elevated. Recommend working on low-fat diet. All other labs were within normal range.

## 2024-09-02 NOTE — Telephone Encounter (Signed)
 Copied from CRM #[id number]. Topic: Clinical - Lab/Test Results >> Sep 02, 2024  4:33 PM Turkey B wrote: Reason for CRM: Patient returned call, I gave her lab results and recommended info

## 2024-09-05 NOTE — Telephone Encounter (Signed)
 Noted

## 2024-09-15 ENCOUNTER — Ambulatory Visit: Admitting: Neurology

## 2024-09-15 ENCOUNTER — Encounter: Payer: Self-pay | Admitting: Neurology

## 2024-09-15 VITALS — BP 126/79 | HR 79 | Ht 62.0 in | Wt 186.0 lb

## 2024-09-15 DIAGNOSIS — H8120 Vestibular neuronitis, unspecified ear: Secondary | ICD-10-CM

## 2024-09-15 NOTE — Patient Instructions (Signed)
 Continue current medications  Continue to follow up with your PCP  Return as needed

## 2024-09-15 NOTE — Progress Notes (Signed)
 [facility]  PATIENT: [name] DOB: [birth year]  REQUESTING CLINICIAN: Antonetta Rollene BRAVO, MD HISTORY FROM: Patient  REASON FOR VISIT: Unsteadiness, loss of balance    HISTORICAL  CHIEF COMPLAINT:  Chief Complaint  Patient presents with   New Patient (Initial Visit)    Pt in room 12. Alone. NP/internal referral for lightheadedness, unsteady gait. Pt said she not longer has lightheadedness. Pt said she has inner ear infection.    HISTORY OF PRESENT ILLNESS:  Discussed the use of AI scribe software for clinical note transcription with the patient, who gave verbal consent to proceed.  [name] is a 48 year old female who presents with recurrent episodes of balance issues and lightheadedness.   She has been experiencing recurrent episodes of balance issues and lightheadedness since 2020 or 2021. The sensation is described as a loss of balance without dizziness, with a tendency to fall to the right. There is no sensation of the room spinning. Initial treatment for an inner ear infection with steroids provided some relief, but her balance did not fully return to normal.  In the summer of 2025, she experienced another episode with similar symptoms. She was treated with a tapering dose of prednisone  over several weeks. Despite this, her symptoms persisted, leading to a referral to a neurologist. Various evaluations, including ear examinations and hearing tests, have not revealed any fluid or abnormalities in the inner ear. Symptoms typically begin suddenly and can last for several weeks, with improvement noted after completing steroid treatment.  She has a history of low vitamin D  levels, corrected with over-the-counter supplements. She reports occasional tinnitus, which is not frequent or severe, and denies any hearing loss. She has not had significant ear infections or earaches in the past, nor issues with ear tubes during childhood.  She has not  tried vestibular therapy in the past. She takes Zyrtec for sinus issues and uses a nasal spray to manage sinus drainage, preferring this over experiencing balance issues. She reports occasional headaches attributed to sinus issues, managed with Vicks VapoRub rather than medication.       OTHER MEDICAL CONDITIONS: Seasonal allergies    REVIEW OF SYSTEMS: Full 14 system review of systems performed and negative with exception of: As noted in the HPI   ALLERGIES: Allergies  Allergen Reactions   Levaquin [Levofloxacin] Other (See Comments)    Whole body sensation of heat (no urticaria).   Tamiflu  [Oseltamivir  Phosphate] Other (See Comments)    Anxiety, insomnia, shaky, vomiting   Sulfonamide Derivatives Hives    HOME MEDICATIONS: Outpatient Medications Prior to Visit  Medication Sig Dispense Refill   cetirizine (ZYRTEC) 10 MG tablet Take 10 mg by mouth daily.     cholecalciferol (VITAMIN D3) 25 MCG (1000 UNIT) tablet Take 1,000 Units by mouth daily.     fluticasone  (FLONASE ) 50 MCG/ACT nasal spray Place 1 spray into both nostrils daily as needed for allergies or rhinitis. 15.8 mL 11   Multiple Vitamins-Minerals (MULTIVITAMIN ADULT) CHEW Chew 2 each by mouth daily.     triamcinolone  cream (KENALOG ) 0.1 % Apply 1 Application topically 2 (two) times daily as needed (rash). 60 g 1   No facility-administered medications prior to visit.    PAST MEDICAL HISTORY: Past Medical History:  Diagnosis Date   Anemia    GERD (gastroesophageal reflux disease)    Helicobacter pylori (H. pylori)    prevpac   Hemorrhoids     PAST SURGICAL HISTORY: Past Surgical History:  Procedure  Laterality Date   ABDOMINAL HYSTERECTOMY     APPENDECTOMY     48 years old   BIOPSY  05/30/2022   Procedure: BIOPSY;  Surgeon: Shaaron Lamar HERO, MD;  Location: [facility];  Service: Endoscopy;;   COLONOSCOPY WITH PROPOFOL  N/A 05/30/2022   Procedure: COLONOSCOPY WITH PROPOFOL ;  Surgeon: Shaaron Lamar HERO, MD;   Location: [facility];  Service: Endoscopy;  Laterality: N/A;  2:00pm   ESOPHAGOGASTRODUODENOSCOPY  09/14/2007   Dr. Al quadrant distal esophageal erosion consistant with  moderately severe erosive reflux esophagitits, o/w normal esophagus.,tiny antral erosions o/w normal stomach, inflammation on bx   ESOPHAGOGASTRODUODENOSCOPY (EGD) WITH ESOPHAGEAL DILATION  12/09/2012   Dr. Shaaron- normal esophagus s/p maloney dilation, small hiatal hernia   POLYPECTOMY  05/30/2022   Procedure: POLYPECTOMY;  Surgeon: Shaaron Lamar HERO, MD;  Location: [facility];  Service: Endoscopy;;   SALPINGOOPHORECTOMY Right 06/03/2017   Procedure: SALPINGO OOPHORECTOMY;  Surgeon: Jayne Vonn DEL, MD;  Location: [facility];  Service: Gynecology;  Laterality: Right;   SUPRACERVICAL ABDOMINAL HYSTERECTOMY N/A 06/03/2017   Procedure: HYSTERECTOMY SUPRACERVICAL ABDOMINAL;  Surgeon: Jayne Vonn DEL, MD;  Location: [facility];  Service: Gynecology;  Laterality: N/A;   UNILATERAL SALPINGECTOMY Left 06/03/2017   Procedure: left salpingectomy;  Surgeon: Jayne Vonn DEL, MD;  Location: [facility];  Service: Gynecology;  Laterality: Left;    FAMILY HISTORY: History reviewed. No pertinent family history.  SOCIAL HISTORY: Social History   Socioeconomic History   Marital status: Single    Spouse name: Not on file   Number of children: Not on file   Years of education: Not on file   Highest education level: Not on file  Occupational History   Not on file  Tobacco Use   Smoking status: Never    Passive exposure: Never   Smokeless tobacco: Never  Vaping Use   Vaping status: Never Used  Substance and Sexual Activity   Alcohol use: No   Drug use: No   Sexual activity: Yes    Birth control/protection: Surgical    Comment: hyst  Other Topics Concern   Not on file  Social History Narrative   Not on file   Social Drivers of Health   Financial Resource Strain: Not on file  Food Insecurity: No Food Insecurity (12/15/2023)   Hunger  Vital Sign    Worried About Running Out of Food in the Last Year: Never true    Ran Out of Food in the Last Year: Never true  Transportation Needs: No Transportation Needs (12/15/2023)   PRAPARE - Administrator, Civil Service (Medical): No    Lack of Transportation (Non-Medical): No  Physical Activity: Sufficiently Active (12/15/2023)   Exercise Vital Sign    Days of Exercise per Week: 6 days    Minutes of Exercise per Session: 60 min  Stress: No Stress Concern Present (12/15/2023)   [employer] of Occupational Health - Occupational Stress Questionnaire    Feeling of Stress : Not at all  Social Connections: Moderately Integrated (12/15/2023)   Social Connection and Isolation Panel    Frequency of Communication with Friends and Family: More than three times a week    Frequency of Social Gatherings with Friends and Family: More than three times a week    Attends Religious Services: More than 4 times per year    Active Member of [employer] or Organizations: Yes    Attends Banker Meetings: More than 4 times per year  Marital Status: Never married  Intimate Partner Violence: Not At Risk (12/15/2023)   Humiliation, Afraid, Rape, and Kick questionnaire    Fear of Current or Ex-Partner: No    Emotionally Abused: No    Physically Abused: No    Sexually Abused: No    PHYSICAL EXAM  GENERAL EXAM/CONSTITUTIONAL: Vitals:  Vitals:   09/15/24 1308  BP: 126/79  Pulse: 79  Weight: 186 lb (84.4 kg)  Height: 5' 2 (1.575 m)   Body mass index is 34.02 kg/m. Wt Readings from Last 3 Encounters:  09/15/24 186 lb (84.4 kg)  05/30/24 180 lb 12.4 oz (82 kg)  04/09/24 181 lb (82.1 kg)   Patient is in no distress; well developed, nourished and groomed; neck is supple  MUSCULOSKELETAL: Gait, strength, tone, movements noted in Neurologic exam below  NEUROLOGIC: MENTAL STATUS:      No data to display         awake, alert, oriented to person, place and time recent and  remote memory intact normal attention and concentration language fluent, comprehension intact, naming intact fund of knowledge appropriate  CRANIAL NERVE:  2nd, 3rd, 4th, 6th - Visual fields full to confrontation, extraocular muscles intact, no nystagmus 5th - facial sensation symmetric 7th - facial strength symmetric 8th - hearing intact 9th - palate elevates symmetrically, uvula midline 11th - shoulder shrug symmetric 12th - tongue protrusion midline  MOTOR:  normal bulk and tone, full strength in the BUE, BLE  SENSORY:  normal and symmetric to light touch  COORDINATION:  finger-nose-finger, fine finger movements normal  GAIT/STATION:  Normal, able to tandem walk, negative Romberg    DIAGNOSTIC DATA (LABS, IMAGING, TESTING) - I reviewed patient records, labs, notes, testing and imaging myself where available.  Lab Results  Component Value Date   WBC 8.5 08/29/2024   HGB 12.9 08/29/2024   HCT 39.3 08/29/2024   MCV 90 08/29/2024   PLT 425 08/29/2024      Component Value Date/Time   NA 138 08/29/2024 0951   K 4.5 08/29/2024 0951   CL 102 08/29/2024 0951   CO2 21 08/29/2024 0951   GLUCOSE 90 08/29/2024 0951   GLUCOSE 105 (H) 05/30/2024 2117   BUN 7 08/29/2024 0951   CREATININE 0.88 08/29/2024 0951   CALCIUM 9.6 08/29/2024 0951   PROT 7.4 08/29/2024 0951   ALBUMIN 4.3 08/29/2024 0951   AST 18 08/29/2024 0951   ALT 13 08/29/2024 0951   ALKPHOS 115 08/29/2024 0951   BILITOT 0.3 08/29/2024 0951   GFRNONAA >60 05/30/2024 2117   GFRAA >60 05/03/2020 0415   Lab Results  Component Value Date   CHOL 242 (H) 08/29/2024   HDL 67 08/29/2024   LDLCALC 146 (H) 08/29/2024   TRIG 161 (H) 08/29/2024   CHOLHDL 3.6 08/29/2024   Lab Results  Component Value Date   HGBA1C 5.8 (H) 08/29/2024   Lab Results  Component Value Date   VITAMINB12 875 11/27/2023   Lab Results  Component Value Date   TSH 2.690 08/29/2024    MRI Brain 06/09/2024 1.  No evidence of an  acute intracranial abnormality. 2. Few small nonspecific chronic insults within the cerebral white matter. 3. Otherwise unremarkable non-contrast MRI appearance of the brain. 4. Small left maxillary sinus mucous retention cysts.   ASSESSMENT AND PLAN  48 y.o. 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 No significant hearing loss or tinnitus. Previous episodes treated with steroids. Recent MRI ruled out stroke and tumors. Differential includes labyrinthitis, but current presentation aligns with vestibular neuritis. - Advised urgent care or ER visit during symptomatic episodes to rule out stroke. - Recommended ENT consultation during symptomatic episodes for further evaluation and management. - Continue steroid treatment as prescribed during episodes. - Educated on maintaining hydration and avoiding illness to prevent recurrence.  Chronic sinusitis with sinus-related headache Chronic sinusitis with associated headaches, likely contributing to vestibular symptoms. Current management includes Zyrtec and fluticasone  nasal spray. Symptoms include nasal drainage and mild headaches. - Recommended switching to Zyrtec D for better symptom control. - Continue fluticasone  nasal spray as needed. - Advised on maintaining hydration and using Vicks VapoRub for headache relief.  Ocular floaters Intermittent ocular floaters described as silver dots, occurring with head movements. No associated pain or significant vision changes. Recent eye exam was normal. Symptoms consistent with benign ocular floaters. - Continue to monitor symptoms and report any changes in vision or increase in floaters.    1. Vestibular neuronitis, unspecified laterality     Patient Instructions  Continue current medications  Continue to follow up with your PCP  Return  as needed   No orders of the defined types were placed in this encounter.   No orders of the defined types were placed in this encounter.   Return if symptoms worsen or fail to improve.   Pastor Falling, MD 09/15/2024, 1:57 PM  [facility] [address], [address] [place], [place] [address] [phone]

## 2025-08-30 ENCOUNTER — Encounter
